# Patient Record
Sex: Male | Born: 1940 | Race: White | Hispanic: No | Marital: Married | Smoking: Former smoker
Health system: Southern US, Community
[De-identification: ages and names within clinical notes are randomized; demographics above are authoritative.]

## PROBLEM LIST (undated history)

## (undated) DIAGNOSIS — I739 Peripheral vascular disease, unspecified: Secondary | ICD-10-CM

## (undated) DIAGNOSIS — I472 Ventricular tachycardia, unspecified: Secondary | ICD-10-CM

## (undated) DIAGNOSIS — Z8673 Personal history of transient ischemic attack (TIA), and cerebral infarction without residual deficits: Secondary | ICD-10-CM

## (undated) DIAGNOSIS — I2510 Atherosclerotic heart disease of native coronary artery without angina pectoris: Secondary | ICD-10-CM

## (undated) DIAGNOSIS — I255 Ischemic cardiomyopathy: Secondary | ICD-10-CM

## (undated) DIAGNOSIS — N183 Chronic kidney disease, stage 3 unspecified: Secondary | ICD-10-CM

## (undated) DIAGNOSIS — I10 Essential (primary) hypertension: Secondary | ICD-10-CM

## (undated) DIAGNOSIS — Z950 Presence of cardiac pacemaker: Secondary | ICD-10-CM

## (undated) DIAGNOSIS — I509 Heart failure, unspecified: Secondary | ICD-10-CM

## (undated) DIAGNOSIS — I219 Acute myocardial infarction, unspecified: Secondary | ICD-10-CM

## (undated) DIAGNOSIS — E079 Disorder of thyroid, unspecified: Secondary | ICD-10-CM

## (undated) DIAGNOSIS — E785 Hyperlipidemia, unspecified: Secondary | ICD-10-CM

## (undated) DIAGNOSIS — I4891 Unspecified atrial fibrillation: Secondary | ICD-10-CM

## (undated) DIAGNOSIS — F419 Anxiety disorder, unspecified: Secondary | ICD-10-CM

## (undated) DIAGNOSIS — Z720 Tobacco use: Secondary | ICD-10-CM

## (undated) HISTORY — DX: Heart failure, unspecified: I50.9

## (undated) HISTORY — DX: Disorder of thyroid, unspecified: E07.9

## (undated) HISTORY — DX: Ventricular tachycardia: I47.2

## (undated) HISTORY — PX: FEMORAL BYPASS: SHX50

## (undated) HISTORY — DX: Chronic kidney disease, stage 3 (moderate): N18.3

## (undated) HISTORY — DX: Presence of cardiac pacemaker: Z95.0

## (undated) HISTORY — DX: Personal history of transient ischemic attack (TIA), and cerebral infarction without residual deficits: Z86.73

## (undated) HISTORY — DX: Hyperlipidemia, unspecified: E78.5

## (undated) HISTORY — DX: Acute myocardial infarction, unspecified: I21.9

## (undated) HISTORY — DX: Chronic kidney disease, stage 3 unspecified: N18.30

## (undated) HISTORY — PX: CHOLECYSTECTOMY: SHX55

## (undated) HISTORY — DX: Ischemic cardiomyopathy: I25.5

## (undated) HISTORY — DX: Unspecified atrial fibrillation: I48.91

## (undated) HISTORY — DX: Atherosclerotic heart disease of native coronary artery without angina pectoris: I25.10

## (undated) HISTORY — DX: Tobacco use: Z72.0

## (undated) HISTORY — DX: Anxiety disorder, unspecified: F41.9

## (undated) HISTORY — DX: Essential (primary) hypertension: I10

## (undated) HISTORY — DX: Peripheral vascular disease, unspecified: I73.9

## (undated) HISTORY — DX: Ventricular tachycardia, unspecified: I47.20

---

## 1999-12-11 DIAGNOSIS — I219 Acute myocardial infarction, unspecified: Secondary | ICD-10-CM

## 1999-12-11 HISTORY — PX: CARDIAC CATHETERIZATION: SHX172

## 1999-12-11 HISTORY — DX: Acute myocardial infarction, unspecified: I21.9

## 1999-12-11 HISTORY — PX: CORONARY ARTERY BYPASS GRAFT: SHX141

## 1999-12-15 ENCOUNTER — Encounter: Payer: Self-pay | Admitting: Emergency Medicine

## 1999-12-15 ENCOUNTER — Inpatient Hospital Stay (HOSPITAL_COMMUNITY): Admission: EM | Admit: 1999-12-15 | Discharge: 2000-01-18 | Payer: Self-pay | Admitting: Emergency Medicine

## 1999-12-16 ENCOUNTER — Encounter: Payer: Self-pay | Admitting: Cardiology

## 1999-12-17 ENCOUNTER — Encounter: Payer: Self-pay | Admitting: Cardiology

## 1999-12-21 ENCOUNTER — Encounter: Payer: Self-pay | Admitting: Thoracic Surgery (Cardiothoracic Vascular Surgery)

## 1999-12-22 ENCOUNTER — Encounter: Payer: Self-pay | Admitting: Thoracic Surgery (Cardiothoracic Vascular Surgery)

## 1999-12-23 ENCOUNTER — Encounter: Payer: Self-pay | Admitting: Thoracic Surgery (Cardiothoracic Vascular Surgery)

## 1999-12-24 ENCOUNTER — Encounter: Payer: Self-pay | Admitting: Cardiothoracic Surgery

## 1999-12-25 ENCOUNTER — Encounter: Payer: Self-pay | Admitting: Thoracic Surgery (Cardiothoracic Vascular Surgery)

## 1999-12-26 ENCOUNTER — Encounter: Payer: Self-pay | Admitting: Thoracic Surgery (Cardiothoracic Vascular Surgery)

## 1999-12-27 ENCOUNTER — Encounter: Payer: Self-pay | Admitting: Thoracic Surgery (Cardiothoracic Vascular Surgery)

## 1999-12-28 ENCOUNTER — Encounter: Payer: Self-pay | Admitting: Thoracic Surgery (Cardiothoracic Vascular Surgery)

## 1999-12-29 ENCOUNTER — Encounter: Payer: Self-pay | Admitting: Thoracic Surgery (Cardiothoracic Vascular Surgery)

## 1999-12-29 ENCOUNTER — Encounter: Payer: Self-pay | Admitting: Surgery

## 1999-12-30 ENCOUNTER — Encounter: Payer: Self-pay | Admitting: Surgery

## 1999-12-31 ENCOUNTER — Encounter: Payer: Self-pay | Admitting: Thoracic Surgery (Cardiothoracic Vascular Surgery)

## 2000-01-01 ENCOUNTER — Encounter: Payer: Self-pay | Admitting: Thoracic Surgery (Cardiothoracic Vascular Surgery)

## 2000-01-02 ENCOUNTER — Encounter: Payer: Self-pay | Admitting: Thoracic Surgery (Cardiothoracic Vascular Surgery)

## 2000-01-03 ENCOUNTER — Encounter: Payer: Self-pay | Admitting: Cardiothoracic Surgery

## 2000-01-04 ENCOUNTER — Encounter: Payer: Self-pay | Admitting: Thoracic Surgery (Cardiothoracic Vascular Surgery)

## 2000-01-05 ENCOUNTER — Encounter: Payer: Self-pay | Admitting: Thoracic Surgery (Cardiothoracic Vascular Surgery)

## 2000-01-06 ENCOUNTER — Encounter: Payer: Self-pay | Admitting: Thoracic Surgery (Cardiothoracic Vascular Surgery)

## 2000-01-07 ENCOUNTER — Encounter: Payer: Self-pay | Admitting: *Deleted

## 2000-01-07 ENCOUNTER — Encounter: Payer: Self-pay | Admitting: Cardiothoracic Surgery

## 2000-01-08 ENCOUNTER — Encounter: Payer: Self-pay | Admitting: Thoracic Surgery (Cardiothoracic Vascular Surgery)

## 2000-01-09 ENCOUNTER — Encounter: Payer: Self-pay | Admitting: Thoracic Surgery (Cardiothoracic Vascular Surgery)

## 2000-01-10 ENCOUNTER — Encounter: Payer: Self-pay | Admitting: Thoracic Surgery (Cardiothoracic Vascular Surgery)

## 2000-01-11 ENCOUNTER — Encounter: Payer: Self-pay | Admitting: Thoracic Surgery (Cardiothoracic Vascular Surgery)

## 2000-01-12 ENCOUNTER — Encounter: Payer: Self-pay | Admitting: Thoracic Surgery (Cardiothoracic Vascular Surgery)

## 2000-01-13 ENCOUNTER — Encounter: Payer: Self-pay | Admitting: Thoracic Surgery (Cardiothoracic Vascular Surgery)

## 2000-01-15 ENCOUNTER — Encounter: Payer: Self-pay | Admitting: Thoracic Surgery (Cardiothoracic Vascular Surgery)

## 2000-02-06 ENCOUNTER — Encounter (HOSPITAL_COMMUNITY): Admission: RE | Admit: 2000-02-06 | Discharge: 2000-05-06 | Payer: Self-pay | Admitting: Cardiology

## 2000-10-03 ENCOUNTER — Encounter: Payer: Self-pay | Admitting: Emergency Medicine

## 2000-10-03 ENCOUNTER — Emergency Department (HOSPITAL_COMMUNITY): Admission: EM | Admit: 2000-10-03 | Discharge: 2000-10-03 | Payer: Self-pay | Admitting: Emergency Medicine

## 2000-10-21 ENCOUNTER — Emergency Department (HOSPITAL_COMMUNITY): Admission: EM | Admit: 2000-10-21 | Discharge: 2000-10-21 | Payer: Self-pay | Admitting: Emergency Medicine

## 2000-10-28 ENCOUNTER — Encounter: Payer: Self-pay | Admitting: Family Medicine

## 2000-10-28 ENCOUNTER — Encounter: Admission: RE | Admit: 2000-10-28 | Discharge: 2000-10-28 | Payer: Self-pay | Admitting: Family Medicine

## 2000-11-12 ENCOUNTER — Encounter: Payer: Self-pay | Admitting: General Surgery

## 2000-11-15 ENCOUNTER — Observation Stay (HOSPITAL_COMMUNITY): Admission: RE | Admit: 2000-11-15 | Discharge: 2000-11-16 | Payer: Self-pay | Admitting: General Surgery

## 2000-11-15 ENCOUNTER — Encounter (INDEPENDENT_AMBULATORY_CARE_PROVIDER_SITE_OTHER): Payer: Self-pay | Admitting: Specialist

## 2000-12-12 ENCOUNTER — Encounter: Payer: Self-pay | Admitting: Emergency Medicine

## 2000-12-12 ENCOUNTER — Inpatient Hospital Stay (HOSPITAL_COMMUNITY): Admission: EM | Admit: 2000-12-12 | Discharge: 2000-12-13 | Payer: Self-pay | Admitting: Emergency Medicine

## 2000-12-13 ENCOUNTER — Encounter: Payer: Self-pay | Admitting: Cardiology

## 2001-06-16 ENCOUNTER — Encounter: Admission: RE | Admit: 2001-06-16 | Discharge: 2001-06-16 | Payer: Self-pay | Admitting: Family Medicine

## 2001-06-16 ENCOUNTER — Encounter: Payer: Self-pay | Admitting: Family Medicine

## 2001-07-23 ENCOUNTER — Encounter: Payer: Self-pay | Admitting: General Surgery

## 2001-07-25 ENCOUNTER — Ambulatory Visit (HOSPITAL_COMMUNITY): Admission: RE | Admit: 2001-07-25 | Discharge: 2001-07-25 | Payer: Self-pay | Admitting: General Surgery

## 2003-08-12 ENCOUNTER — Encounter: Payer: Self-pay | Admitting: Internal Medicine

## 2003-08-12 ENCOUNTER — Ambulatory Visit (HOSPITAL_COMMUNITY): Admission: RE | Admit: 2003-08-12 | Discharge: 2003-08-12 | Payer: Self-pay | Admitting: Internal Medicine

## 2003-12-11 HISTORY — PX: CARDIAC CATHETERIZATION: SHX172

## 2004-10-07 ENCOUNTER — Inpatient Hospital Stay (HOSPITAL_COMMUNITY): Admission: EM | Admit: 2004-10-07 | Discharge: 2004-10-25 | Payer: Self-pay | Admitting: Emergency Medicine

## 2004-10-07 ENCOUNTER — Ambulatory Visit: Payer: Self-pay | Admitting: Infectious Diseases

## 2004-10-07 ENCOUNTER — Ambulatory Visit: Payer: Self-pay | Admitting: Internal Medicine

## 2004-10-09 ENCOUNTER — Encounter: Payer: Self-pay | Admitting: Cardiology

## 2004-10-24 ENCOUNTER — Ambulatory Visit: Payer: Self-pay | Admitting: Infectious Diseases

## 2004-12-10 HISTORY — PX: OTHER SURGICAL HISTORY: SHX169

## 2005-01-01 ENCOUNTER — Ambulatory Visit: Payer: Self-pay

## 2005-01-03 ENCOUNTER — Encounter (HOSPITAL_COMMUNITY): Admission: RE | Admit: 2005-01-03 | Discharge: 2005-04-03 | Payer: Self-pay | Admitting: Cardiology

## 2005-03-12 ENCOUNTER — Emergency Department (HOSPITAL_COMMUNITY): Admission: EM | Admit: 2005-03-12 | Discharge: 2005-03-12 | Payer: Self-pay | Admitting: Emergency Medicine

## 2006-04-18 ENCOUNTER — Emergency Department (HOSPITAL_COMMUNITY): Admission: EM | Admit: 2006-04-18 | Discharge: 2006-04-19 | Payer: Self-pay | Admitting: Emergency Medicine

## 2009-05-25 ENCOUNTER — Encounter: Payer: Self-pay | Admitting: Internal Medicine

## 2009-06-29 DIAGNOSIS — Z95810 Presence of automatic (implantable) cardiac defibrillator: Secondary | ICD-10-CM | POA: Insufficient documentation

## 2009-06-29 DIAGNOSIS — N19 Unspecified kidney failure: Secondary | ICD-10-CM | POA: Insufficient documentation

## 2009-06-29 DIAGNOSIS — I219 Acute myocardial infarction, unspecified: Secondary | ICD-10-CM | POA: Insufficient documentation

## 2009-06-29 DIAGNOSIS — I739 Peripheral vascular disease, unspecified: Secondary | ICD-10-CM

## 2009-06-29 DIAGNOSIS — Z951 Presence of aortocoronary bypass graft: Secondary | ICD-10-CM | POA: Insufficient documentation

## 2009-06-29 DIAGNOSIS — I2510 Atherosclerotic heart disease of native coronary artery without angina pectoris: Secondary | ICD-10-CM

## 2009-06-29 DIAGNOSIS — I472 Ventricular tachycardia, unspecified: Secondary | ICD-10-CM | POA: Insufficient documentation

## 2009-06-29 DIAGNOSIS — I509 Heart failure, unspecified: Secondary | ICD-10-CM | POA: Insufficient documentation

## 2009-06-29 DIAGNOSIS — I10 Essential (primary) hypertension: Secondary | ICD-10-CM | POA: Insufficient documentation

## 2009-06-30 ENCOUNTER — Ambulatory Visit: Payer: Self-pay | Admitting: Internal Medicine

## 2009-07-08 ENCOUNTER — Ambulatory Visit: Payer: Self-pay | Admitting: Internal Medicine

## 2009-07-11 LAB — CONVERTED CEMR LAB
BUN: 19 mg/dL (ref 6–23)
Basophils Absolute: 0.1 10*3/uL (ref 0.0–0.1)
Basophils Relative: 1 % (ref 0.0–3.0)
CO2: 29 meq/L (ref 19–32)
Calcium: 9.3 mg/dL (ref 8.4–10.5)
Chloride: 110 meq/L (ref 96–112)
Creatinine, Ser: 1.5 mg/dL (ref 0.4–1.5)
Eosinophils Absolute: 0.1 10*3/uL (ref 0.0–0.7)
Eosinophils Relative: 1.1 % (ref 0.0–5.0)
GFR calc non Af Amer: 49.1 mL/min (ref >60)
Glucose, Bld: 106 mg/dL — ABNORMAL HIGH (ref 70–99)
HCT: 42.8 % (ref 39.0–52.0)
Hemoglobin: 14.6 g/dL (ref 13.0–17.0)
INR: 2.7 — ABNORMAL HIGH (ref 0.8–1.0)
Lymphocytes Relative: 20.9 % (ref 12.0–46.0)
Lymphs Abs: 1.7 10*3/uL (ref 0.7–4.0)
MCHC: 34.1 g/dL (ref 30.0–36.0)
MCV: 93.7 fL (ref 78.0–100.0)
Monocytes Absolute: 0.6 10*3/uL (ref 0.1–1.0)
Monocytes Relative: 8 % (ref 3.0–12.0)
Neutro Abs: 5.4 10*3/uL (ref 1.4–7.7)
Neutrophils Relative %: 69 % (ref 43.0–77.0)
Platelets: 165 10*3/uL (ref 150.0–400.0)
Potassium: 3.7 meq/L (ref 3.5–5.1)
Prothrombin Time: 27.9 s — ABNORMAL HIGH (ref 10.9–13.3)
RBC: 4.57 M/uL (ref 4.22–5.81)
RDW: 15.3 % — ABNORMAL HIGH (ref 11.5–14.6)
Sodium: 144 meq/L (ref 135–145)
WBC: 7.9 10*3/uL (ref 4.5–10.5)
aPTT: 39.4 s — ABNORMAL HIGH (ref 21.7–28.8)

## 2009-07-15 ENCOUNTER — Ambulatory Visit (HOSPITAL_COMMUNITY): Admission: RE | Admit: 2009-07-15 | Discharge: 2009-07-16 | Payer: Self-pay | Admitting: Internal Medicine

## 2009-07-15 ENCOUNTER — Ambulatory Visit: Payer: Self-pay | Admitting: Internal Medicine

## 2009-07-15 HISTORY — PX: OTHER SURGICAL HISTORY: SHX169

## 2009-07-16 ENCOUNTER — Encounter: Payer: Self-pay | Admitting: Internal Medicine

## 2009-07-21 ENCOUNTER — Encounter: Payer: Self-pay | Admitting: Internal Medicine

## 2009-08-01 ENCOUNTER — Encounter: Payer: Self-pay | Admitting: Internal Medicine

## 2009-08-01 ENCOUNTER — Ambulatory Visit: Payer: Self-pay

## 2009-10-10 ENCOUNTER — Encounter: Payer: Self-pay | Admitting: Internal Medicine

## 2009-10-20 ENCOUNTER — Encounter: Payer: Self-pay | Admitting: Internal Medicine

## 2009-12-27 ENCOUNTER — Ambulatory Visit: Payer: Self-pay | Admitting: Internal Medicine

## 2009-12-27 DIAGNOSIS — I5022 Chronic systolic (congestive) heart failure: Secondary | ICD-10-CM

## 2010-04-05 ENCOUNTER — Encounter: Payer: Self-pay | Admitting: Internal Medicine

## 2010-04-19 ENCOUNTER — Ambulatory Visit: Payer: Self-pay | Admitting: Internal Medicine

## 2010-05-01 ENCOUNTER — Encounter (INDEPENDENT_AMBULATORY_CARE_PROVIDER_SITE_OTHER): Payer: Self-pay | Admitting: *Deleted

## 2010-07-21 ENCOUNTER — Encounter (INDEPENDENT_AMBULATORY_CARE_PROVIDER_SITE_OTHER): Payer: Self-pay | Admitting: *Deleted

## 2010-07-28 ENCOUNTER — Ambulatory Visit: Payer: Self-pay | Admitting: Cardiology

## 2010-08-25 ENCOUNTER — Ambulatory Visit: Payer: Self-pay | Admitting: Cardiology

## 2010-09-22 ENCOUNTER — Ambulatory Visit: Payer: Self-pay | Admitting: Cardiology

## 2010-10-24 ENCOUNTER — Ambulatory Visit: Payer: Self-pay | Admitting: Cardiology

## 2010-11-22 ENCOUNTER — Ambulatory Visit: Payer: Self-pay | Admitting: Cardiology

## 2010-12-06 ENCOUNTER — Encounter (INDEPENDENT_AMBULATORY_CARE_PROVIDER_SITE_OTHER): Payer: Self-pay | Admitting: *Deleted

## 2010-12-09 ENCOUNTER — Encounter: Payer: Self-pay | Admitting: Internal Medicine

## 2010-12-13 ENCOUNTER — Ambulatory Visit: Payer: Self-pay | Admitting: Cardiology

## 2010-12-29 ENCOUNTER — Ambulatory Visit: Payer: Self-pay | Admitting: Cardiology

## 2011-01-02 ENCOUNTER — Encounter: Payer: Self-pay | Admitting: Internal Medicine

## 2011-01-02 ENCOUNTER — Ambulatory Visit
Admission: RE | Admit: 2011-01-02 | Discharge: 2011-01-02 | Payer: Self-pay | Source: Home / Self Care | Attending: Internal Medicine | Admitting: Internal Medicine

## 2011-01-09 ENCOUNTER — Ambulatory Visit: Payer: Self-pay | Admitting: Cardiology

## 2011-01-09 NOTE — Letter (Signed)
Summary: Remote Device Check  [employer], Main Office  [address]   [place], [place] [address]   Phone: [phone]  Fax: [phone]     May 01, 2010 MRN: [id number]   KEJUAN BEKKER [address] [place], [place]  [address]   Dear Mr. Equihua,   Your remote transmission was recieved and reviewed by your physician.  All diagnostics were within normal limits for you.  __X___Your next transmission is scheduled for:  07-20-2010.  Please transmit at any time this day.  If you have a wireless device your transmission will be sent automatically.  Sincerely,  Vella Kohler

## 2011-01-09 NOTE — Assessment & Plan Note (Signed)
Summary: pc2/jml   Visit Type:  Follow-up Primary Provider:  [facility]   History of Present Illness: Mr. Heinbaugh returns today for ICD followup.  He reached ERI on his device several months ago and underwent BiV ICD upgrade secondary to worsening CHF symptoms in the setting of pacing induced LBBB.  He feels well for the most part with mostly class 2 CHF symptoms.  He has not had VT.  He is bradycardic and is predominently V Paced ( 97%).  He has not received any ICD shocks.  He denies peripheral edema or c/p. His energy has improved and dyspnea resolved.  Current Medications (verified): 1)  Coreg 25 Mg Tabs (Carvedilol) .... Two Times A Day 2)  Furosemide 20 Mg Tabs (Furosemide) .... 2 Tabs in Am 1 Tab in Pm 3)  Lovastatin 40 Mg Tabs (Lovastatin) .... Daily 4)  Digoxin 0.125 Mg Tabs (Digoxin) .... Take 1 Tablet By Mouth Daily 5)  Warfarin Sodium 1 Mg Tabs (Warfarin Sodium) .... Use As Directed By Anticoagualtion Clinic 6)  Allopurinol 100 Mg Tabs (Allopurinol) .... Daily 7)  Alprazolam 0.5 Mg Tabs (Alprazolam) .... As Needed 8)  Zemplar 1 Mcg Caps (Paricalcitol) .... Daily 9)  Preservision .Marland Kitchen.. 1 Capsule Two Times A Day 10)  Folbee 2.5-25-1 Mg Tabs (Folic Acid-Vit B6-Vit B12) .... Take One Tablet By Mouth Once Daily.  Allergies: 1)  ! Nitroglycerin  Past History:  Past Medical History: Last updated: 06/29/2009 RENAL FAILURE (ICD-586) MYOCARDIAL INFARCTION (ICD-410.90) PVD (ICD-443.9) CHF (ICD-428.0) HYPERTENSION (ICD-401.9) AUTOMATIC IMPLANTABLE CARDIAC DEFIBRILLATOR SITU (ICD-V45.02) VENTRICULAR TACHYCARDIA (ICD-427.1) CORONARY ARTERY BYPASS GRAFT, HX OF (ICD-V45.81) CAD (ICD-414.00)  Past Surgical History: Last updated: 06/29/2009  coronary artery bypass grafting x 4  12/21/99   Viviann Spare C. Dorris Fetch, M.D.  Laparoscopic cholecystectomy.  lateral internal anal sphincterotomy.  Cardiac Catheterization 2001, 2004, 2005  Review of Systems  The patient denies chest  pain, syncope, dyspnea on exertion, and peripheral edema.    Vital Signs:  Patient profile:   70 year old male Height:      66 inches Weight:      169 pounds BMI:     27.38 Pulse rate:   65 / minute BP sitting:   104 / 72  (left arm)  Vitals Entered By: Laurance Flatten CMA (December 27, 2009 3:05 PM)  Physical Exam  General:  Well developed, well nourished, in no acute distress. Head:  normocephalic and atraumatic Eyes:  PERRLA/EOM intact; conjunctiva and lids normal. Neck:  Neck supple, no JVD. No masses, thyromegaly or abnormal cervical nodes. Chest Wall:  well healed ICD incision. Lungs:  Clear bilaterally with no wheezes, rales, or rhonchi.  No increased work of breathing. Heart:  RRR with normal S1 and S2.  PMI is enlarged and laterally displaced.  No murmurs were appreciated. Abdomen:  Bowel sounds positive; abdomen soft and non-tender without masses, organomegaly, or hernias noted. No hepatosplenomegaly. Msk:  Back normal, normal gait. Muscle strength and tone normal. Pulses:  pulses normal in all 4 extremities Extremities:  No clubbing or cyanosis. Neurologic:  Alert and oriented x 3.    ICD Specifications Following MD:  Lewayne Bunting, MD     Referring MD:  [name] ICD Vendor:  Medtronic     ICD Model Number:  [id number]     ICD Serial Number:  [id number] ICD DOI:  07/15/2009     ICD Implanting MD:  Lewayne Bunting, MD  Lead 1:    Location: RA     DOI: 01/05/2000  Model #: [id number]     Serial #: [id number]     Status: active Lead 2:    Location: RV     DOI: 01/04/2009     Model #: [id number]     Serial #: [id number]     Status: active Lead 3:    Location: LV     DOI: 07/15/2009     Model #: [id number]     Serial #: [id number]     Status: active  Indications::  CHF;Atrial arrhythmias   ICD Follow Up Remote Check?  No Battery Voltage:  3.15 V     Charge Time:  9.0 seconds     Underlying rhythm:  A-fib ICD Dependent:  Yes       ICD Device Measurements Atrium:  Amplitude: 1.4 mV, Impedance:  494 ohms,  Right Ventricle:  Amplitude: 16.5 mV, Impedance: 684 ohms, Threshold: 1.0 V at 0.4 msec Left Ventricle:  Impedance: 342 ohms, Threshold: 2.0 V at 0.8 msec Shock Impedance: 45/59 ohms   Episodes MS Episodes:  1     Percent Mode Switch:  100%     Coumadin:  Yes Shock:  0     ATP:  0     Nonsustained:  0     Atrial Pacing:  0.6%     Ventricular Pacing:  97.2%  Brady Parameters Mode DDDR     Lower Rate Limit:  60     Upper Rate Limit 120 PAV 130     Sensed AV Delay:  100  Tachy Zones VF:  188     VT:  150     Next Remote Date:  03/28/2010     Next Cardiology Appt Due:  12/10/2010 Tech Comments:  RV reprogrammed 2.5@0 .4.  A-fib + coumadin.Optivol normal.   [facility] transmissions every 3 months ROV 1 year Dr. Ladona Ridgel. Altha Harm, LPN  December 27, 2009 3:15 PM  MD Comments:  Agree with above.  Impression & Recommendations:  Problem # 1:  AUTOMATIC IMPLANTABLE CARDIAC DEFIBRILLATOR SITU (ICD-V45.02) The patients BiV ICD is working normally.  Will continue current treatment.  Problem # 2:  VENTRICULAR TACHYCARDIA (ICD-427.1) His VT has been quiet.  He will continue on his current meds. His updated medication list for this problem includes:    Coreg 25 Mg Tabs (Carvedilol) .Marland Kitchen..Marland Kitchen Two times a day    Warfarin Sodium 1 Mg Tabs (Warfarin sodium) ..... Use as directed by anticoagualtion clinic  Problem # 3:  CAD (ICD-414.00) He currently denies anginal symptoms.  Continue meds as below. His updated medication list for this problem includes:    Coreg 25 Mg Tabs (Carvedilol) .Marland Kitchen..Marland Kitchen Two times a day    Warfarin Sodium 1 Mg Tabs (Warfarin sodium) ..... Use as directed by anticoagualtion clinic  Problem # 4:  CHRONIC SYSTOLIC HEART FAILURE (ICD-428.22) He appears to be class 2.  He denies worsening dyspnea.  No other symptoms.  A low sodium diet is recommended. His updated medication list for this problem includes:    Coreg 25 Mg Tabs (Carvedilol) .Marland Kitchen..Marland Kitchen Two times a day    Furosemide 20  Mg Tabs (Furosemide) .Marland Kitchen... 2 tabs in am 1 tab in pm    Digoxin 0.125 Mg Tabs (Digoxin) .Marland Kitchen... Take 1 tablet by mouth daily    Warfarin Sodium 1 Mg Tabs (Warfarin sodium) ..... Use as directed by anticoagualtion clinic  Patient Instructions: 1)  Your physician recommends that you schedule a follow-up appointment in: 12 months with Dr Ladona Ridgel

## 2011-01-09 NOTE — Cardiovascular Report (Signed)
Summary: Office Visit Remote   Office Visit Remote   Imported By: Roderic Ovens 05/03/2010 16:50:19  _____________________________________________________________________  External Attachment:    Type:   Image     Comment:   External Document

## 2011-01-09 NOTE — Letter (Signed)
Summary: Device-Delinquent Phone Journalist, newspaper, Main Office  [address]   [place], [place] [address]   Phone: [phone]  Fax: [phone]     April 05, 2010 MRN: [id number]   [name] [name] [address] [place], [place]  [address]   Dear Mr. Pipe,  According to our records, you were scheduled for a device phone transmission on 03-28-2010.     We did not receive any results from this check.  If you transmitted on your scheduled day, please call us to help troubleshoot your system.  If you forgot to send your transmission, please send one upon receipt of this letter.  Thank you,   Architectural technologist [facility]

## 2011-01-09 NOTE — Consult Note (Signed)
Summary: [facility]  [facility]   Imported By: Marylou Mccoy 12/20/2009 13:39:20  _____________________________________________________________________  External Attachment:    Type:   Image     Comment:   External Document

## 2011-01-09 NOTE — Cardiovascular Report (Signed)
Summary: Office Visit   Office Visit   Imported By: Roderic Ovens 01/02/2010 13:16:18  _____________________________________________________________________  External Attachment:    Type:   Image     Comment:   External Document

## 2011-01-09 NOTE — Letter (Signed)
Summary: Device-Delinquent Phone Journalist, newspaper, Main Office  [address]   [place], [place] [address]   Phone: [phone]  Fax: [phone]     July 21, 2010 MRN: [id number]   [facility] Fuente [address] [place], [place]  [address]   Dear Mr. Enrico,  According to our records, you were scheduled for a device phone transmission on 07/20/2010                              .     We did not receive any results from this check.  If you transmitted on your scheduled day, please call us to help troubleshoot your system.  If you forgot to send your transmission, please send one upon receipt of this letter.  Thank you, Altha Harm, LPN  July 21, 2010 10:59 AM   [facility]

## 2011-01-11 NOTE — Assessment & Plan Note (Signed)
Summary: pc2 medtronic   Visit Type:  Follow-up Primary Provider:  [facility]   History of Present Illness: Mr. Soward returns today for ICD followup.  He is s/p  BiV ICD upgrade secondary to worsening CHF symptoms in the setting of pacing induced LBBB.  He feels well for the most part with mostly class 2 CHF symptoms.  He has not had VT.  He is bradycardic and is predominently V Paced ( 97%).  He has not received any ICD shocks.  He denies peripheral edema or c/p. His energy has improved and dyspnea resolved.  Current Medications (verified): 1)  Coreg 25 Mg Tabs (Carvedilol) .... Two Times A Day 2)  Furosemide 20 Mg Tabs (Furosemide) .... 2 Tabs in Am 1 Tab in Pm 3)  Lovastatin 40 Mg Tabs (Lovastatin) .... Daily 4)  Digoxin 0.125 Mg Tabs (Digoxin) .... Take 1 Tablet By Mouth Daily 5)  Warfarin Sodium 1 Mg Tabs (Warfarin Sodium) .... Use As Directed By Anticoagualtion Clinic 6)  Allopurinol 100 Mg Tabs (Allopurinol) .... Daily 7)  Alprazolam 0.5 Mg Tabs (Alprazolam) .... As Needed 8)  Zemplar 1 Mcg Caps (Paricalcitol) .... Daily 9)  Preservision .Marland Kitchen.. 1 Capsule Two Times A Day 10)  Folbee 2.5-25-1 Mg Tabs (Folic Acid-Vit B6-Vit B12) .... Take One Tablet By Mouth Once Daily.  Allergies: 1)  ! Nitroglycerin  Past History:  Past Medical History: Last updated: 06/29/2009 RENAL FAILURE (ICD-586) MYOCARDIAL INFARCTION (ICD-410.90) PVD (ICD-443.9) CHF (ICD-428.0) HYPERTENSION (ICD-401.9) AUTOMATIC IMPLANTABLE CARDIAC DEFIBRILLATOR SITU (ICD-V45.02) VENTRICULAR TACHYCARDIA (ICD-427.1) CORONARY ARTERY BYPASS GRAFT, HX OF (ICD-V45.81) CAD (ICD-414.00)  Past Surgical History: Last updated: 06/29/2009  coronary artery bypass grafting x 4  12/21/99   Viviann Spare C. Dorris Fetch, M.D.  Laparoscopic cholecystectomy.  lateral internal anal sphincterotomy.  Cardiac Catheterization 2001, 2004, 2005  Review of Systems  The patient denies chest pain, syncope, dyspnea on exertion, and  peripheral edema.    Vital Signs:  Patient profile:   70 year old male Height:      66 inches Weight:      166 pounds BMI:     26.89 Pulse rate:   62 / minute BP sitting:   99 / 66  (left arm)  Vitals Entered By: Laurance Flatten CMA (January 02, 2011 10:41 AM)  Physical Exam  General:  Well developed, well nourished, in no acute distress. Head:  normocephalic and atraumatic Eyes:  PERRLA/EOM intact; conjunctiva and lids normal. Mouth:  Teeth, gums and palate normal. Oral mucosa normal. Neck:  Neck supple, no JVD. No masses, thyromegaly or abnormal cervical nodes. Chest Wall:  well healed ICD incision. Lungs:  Clear bilaterally with no wheezes, rales, or rhonchi.  No increased work of breathing. Heart:  RRR with normal S1 and S2.  PMI is enlarged and laterally displaced.  No murmurs were appreciated. Abdomen:  Bowel sounds positive; abdomen soft and non-tender without masses, organomegaly, or hernias noted. No hepatosplenomegaly. Msk:  Back normal, normal gait. Muscle strength and tone normal. Pulses:  pulses normal in all 4 extremities Extremities:  No clubbing or cyanosis. Neurologic:  Alert and oriented x 3.    ICD Specifications Following MD:  Lewayne Bunting, MD     Referring MD:  [name] ICD Vendor:  Medtronic     ICD Model Number:  [id number]     ICD Serial Number:  [id number] ICD DOI:  07/15/2009     ICD Implanting MD:  Lewayne Bunting, MD  Lead 1:    Location: RA  DOI: 01/05/2000     Model #: [id number]     Serial #: [id number]     Status: active Lead 2:    Location: RV     DOI: 01/04/2009     Model #: [id number]     Serial #: [id number]     Status: active Lead 3:    Location: LV     DOI: 07/15/2009     Model #: [id number]     Serial #: [id number]     Status: active  Indications::  CHF;Atrial arrhythmias   ICD Follow Up Battery Voltage:  3.04 V     Charge Time:  9.7 seconds     Underlying rhythm:  AF ICD Dependent:  Yes       ICD Device Measurements Atrium:  Amplitude: 3.0 mV, Impedance:  437 ohms,  Right Ventricle:  Amplitude: 15.4 mV, Impedance: 684 ohms, Threshold: 1.25 V at 0.40 msec Left Ventricle:  Impedance: 342 ohms, Threshold: 2.00 V at 0.80 msec Shock Impedance: 44/57 ohms   Episodes MS Episodes:  1     Percent Mode Switch:  100%     Coumadin:  Yes Shock:  0     ATP:  0     Nonsustained:  0     Atrial Therapies:  0 Atrial Pacing:  0.6%     Ventricular Pacing:  97.9%  Brady Parameters Mode DDDR     Lower Rate Limit:  60     Upper Rate Limit 120 PAV 130     Sensed AV Delay:  100  Tachy Zones VF:  188     VT:  150     Next Remote Date:  04/05/2011     Next Cardiology Appt Due:  12/11/2011 Tech Comments:  PT IN AF 100% OF TIME. + COUMADIN.  NORMAL DEVICE FUNCTION.  CHANGED LV OUTPUT FROM 3.25 TO 3.00 AND TURNED ON 1:1 SVT.  CHANGED MAX LEAD IMPEDANCES FOR LIA.  DEMONSTRATED TONE FOR PT AND PT AWARE TO CALL OFFICE IF HEARD.  CARELINK 04-05-11 AND ROV IN 12 MTHS W/GT. Vella Kohler  January 02, 2011 10:45 AM MD Comments:  AGree with above.  Impression & Recommendations:  Problem # 1:  CHRONIC SYSTOLIC HEART FAILURE (ICD-428.22) His symptoms have gone from class 3 to class 2. He will continue his curent meds and maintain a low sodium diet. His updated medication list for this problem includes:    Coreg 25 Mg Tabs (Carvedilol) .Marland Kitchen..Marland Kitchen Two times a day    Furosemide 20 Mg Tabs (Furosemide) .Marland Kitchen... 2 tabs in am 1 tab in pm    Digoxin 0.125 Mg Tabs (Digoxin) .Marland Kitchen... Take 1 tablet by mouth daily    Warfarin Sodium 1 Mg Tabs (Warfarin sodium) ..... Use as directed by anticoagualtion clinic  Problem # 2:  AUTOMATIC IMPLANTABLE CARDIAC DEFIBRILLATOR SITU (ICD-V45.02) His device is working normally. Will recheck in several months.  Problem # 3:  VENTRICULAR TACHYCARDIA (ICD-427.1) He has had no ICD shocks or recurrent VT. Will follow. His updated medication list for this problem includes:    Coreg 25 Mg Tabs (Carvedilol) .Marland Kitchen..Marland Kitchen Two times a day    Warfarin Sodium 1 Mg Tabs  (Warfarin sodium) ..... Use as directed by anticoagualtion clinic  Patient Instructions: 1)  Your physician wants you to follow-up in: 12 months with Dr Court Joy will receive a reminder letter in the mail two months in advance. If you don't receive a letter, please call our office to schedule the follow-up  appointment. 2)  Your physician recommends that you continue on your current medications as directed. Please refer to the Current Medication list given to you today.

## 2011-01-11 NOTE — Cardiovascular Report (Signed)
Summary: Certified Letter Signed - Patient (not doing f/u)  Certified Letter Signed - Patient (not doing f/u)   Imported By: Debby Freiberg 12/27/2010 16:54:14  _____________________________________________________________________  External Attachment:    Type:   Image     Comment:   External Document

## 2011-01-11 NOTE — Letter (Signed)
Summary: Device-Delinquent Phone Journalist, newspaper, Main Office  [address]   [place], [place] [address]   Phone: [phone]  Fax: [phone]     December 06, 2010 MRN: [id number]   [facility] Magar [address] [place], [place]  [address]   Dear Mr. Croom,  According to our records, you were scheduled for a device phone transmission on 07-20-2010.     We did not receive any results from this check.  If you transmitted on your scheduled day, please call us to help troubleshoot your system.  If you forgot to send your transmission, please send one upon receipt of this letter.  Thank you,  Vella Kohler  December 06, 2010 11:59 AM   [facility] certified

## 2011-01-17 NOTE — Cardiovascular Report (Signed)
Summary: Office Visit   Office Visit   Imported By: Roderic Ovens 01/10/2011 15:41:41  _____________________________________________________________________  External Attachment:    Type:   Image     Comment:   External Document

## 2011-02-06 ENCOUNTER — Encounter (INDEPENDENT_AMBULATORY_CARE_PROVIDER_SITE_OTHER): Payer: Medicare Other

## 2011-02-06 DIAGNOSIS — I472 Ventricular tachycardia: Secondary | ICD-10-CM

## 2011-02-06 DIAGNOSIS — Z7901 Long term (current) use of anticoagulants: Secondary | ICD-10-CM

## 2011-03-05 ENCOUNTER — Other Ambulatory Visit: Payer: Self-pay | Admitting: *Deleted

## 2011-03-05 DIAGNOSIS — I4891 Unspecified atrial fibrillation: Secondary | ICD-10-CM

## 2011-03-05 DIAGNOSIS — F419 Anxiety disorder, unspecified: Secondary | ICD-10-CM

## 2011-03-05 DIAGNOSIS — E785 Hyperlipidemia, unspecified: Secondary | ICD-10-CM

## 2011-03-05 DIAGNOSIS — I2510 Atherosclerotic heart disease of native coronary artery without angina pectoris: Secondary | ICD-10-CM

## 2011-03-05 MED ORDER — FUROSEMIDE 40 MG PO TABS: ORAL_TABLET | ORAL | Status: DC

## 2011-03-05 MED ORDER — WARFARIN SODIUM 1 MG PO TABS: 1.0000 mg | ORAL_TABLET | ORAL | Status: DC

## 2011-03-05 MED ORDER — ALLOPURINOL 100 MG PO TABS: 100.0000 mg | ORAL_TABLET | Freq: Every day | ORAL | Status: DC

## 2011-03-05 MED ORDER — DIGOXIN 125 MCG PO TABS: 125.0000 ug | ORAL_TABLET | Freq: Every day | ORAL | Status: DC

## 2011-03-05 MED ORDER — ALPRAZOLAM 0.5 MG PO TABS: 0.5000 mg | ORAL_TABLET | Freq: Every evening | ORAL | Status: DC | PRN

## 2011-03-05 MED ORDER — CARVEDILOL 25 MG PO TABS: 25.0000 mg | ORAL_TABLET | Freq: Two times a day (BID) | ORAL | Status: DC

## 2011-03-05 MED ORDER — LOVASTATIN 40 MG PO TABS: 40.0000 mg | ORAL_TABLET | Freq: Every day | ORAL | Status: DC

## 2011-03-05 NOTE — Telephone Encounter (Signed)
Refilled rx per pt request; faxed to [facility]

## 2011-03-06 ENCOUNTER — Ambulatory Visit (INDEPENDENT_AMBULATORY_CARE_PROVIDER_SITE_OTHER): Payer: Medicare Other | Admitting: *Deleted

## 2011-03-06 DIAGNOSIS — I4891 Unspecified atrial fibrillation: Secondary | ICD-10-CM | POA: Insufficient documentation

## 2011-03-06 DIAGNOSIS — Z7901 Long term (current) use of anticoagulants: Secondary | ICD-10-CM

## 2011-03-06 DIAGNOSIS — I472 Ventricular tachycardia: Secondary | ICD-10-CM

## 2011-03-06 DIAGNOSIS — I6350 Cerebral infarction due to unspecified occlusion or stenosis of unspecified cerebral artery: Secondary | ICD-10-CM | POA: Insufficient documentation

## 2011-03-06 LAB — POCT INR: INR: 2.5

## 2011-03-18 LAB — PROTIME-INR
INR: 2.7 — ABNORMAL HIGH (ref 0.00–1.49)
INR: 2.8 — ABNORMAL HIGH (ref 0.00–1.49)
Prothrombin Time: 28.8 seconds — ABNORMAL HIGH (ref 11.6–15.2)
Prothrombin Time: 29.4 seconds — ABNORMAL HIGH (ref 11.6–15.2)

## 2011-03-18 LAB — CBC
HCT: 43 % (ref 39.0–52.0)
Hemoglobin: 14.5 g/dL (ref 13.0–17.0)
MCHC: 33.9 g/dL (ref 30.0–36.0)
MCV: 93.3 fL (ref 78.0–100.0)
Platelets: 159 10*3/uL (ref 150–400)
RBC: 4.6 MIL/uL (ref 4.22–5.81)
RDW: 16.1 % — ABNORMAL HIGH (ref 11.5–15.5)
WBC: 8.4 10*3/uL (ref 4.0–10.5)

## 2011-04-05 ENCOUNTER — Encounter: Payer: Self-pay | Admitting: *Deleted

## 2011-04-08 ENCOUNTER — Encounter: Payer: Self-pay | Admitting: *Deleted

## 2011-04-10 ENCOUNTER — Ambulatory Visit (INDEPENDENT_AMBULATORY_CARE_PROVIDER_SITE_OTHER): Payer: Medicare Other | Admitting: *Deleted

## 2011-04-10 ENCOUNTER — Other Ambulatory Visit: Payer: Self-pay | Admitting: *Deleted

## 2011-04-10 DIAGNOSIS — I6350 Cerebral infarction due to unspecified occlusion or stenosis of unspecified cerebral artery: Secondary | ICD-10-CM

## 2011-04-10 DIAGNOSIS — I4891 Unspecified atrial fibrillation: Secondary | ICD-10-CM

## 2011-04-10 DIAGNOSIS — Z7901 Long term (current) use of anticoagulants: Secondary | ICD-10-CM

## 2011-04-10 MED ORDER — ALPRAZOLAM 0.5 MG PO TABS: 0.5000 mg | ORAL_TABLET | Freq: Three times a day (TID) | ORAL | Status: DC | PRN

## 2011-04-10 NOTE — Telephone Encounter (Signed)
escribe medication per fax request  

## 2011-04-24 NOTE — Op Note (Signed)
NAMEJACARRI, GESNER              ACCOUNT NO.:  [id number]   MEDICAL RECORD NO.:  [id number]          PATIENT TYPE:  INP   LOCATION:  2807                         FACILITY:  [facility]   PHYSICIAN:  Doylene Canning. Ladona Ridgel, MD    DATE OF BIRTH:  13-Jul-1941   DATE OF PROCEDURE:  07/15/2009  DATE OF DISCHARGE:                               OPERATIVE REPORT   PROCEDURE PERFORMED:  Removal of a previously implanted dual-chamber ICD  followed by insertion of a new left ventricular pacing lead utilizing  contrast venography followed by insertion of a new biventricular ICD.   INTRODUCTION:  The patient is a 70 year old male with a history of left  bundle-branch block, severe bradycardia, who has been ventricularly  paced with a defibrillator now for several years.  He has congestive  heart failure and a QRS duration of over 180 milliseconds.  He is now  referred as his defibrillator has reached elective replacement  indication for upgrade to a new biventricular ICD.   PROCEDURE:  After informed consent was obtained, the patient was taken  to the diagnostic EP lab in fasting state.  After usual preparation and  draping, intravenous fentanyl and midazolam was given for sedation.  Intravenous Valium was also given.  Lidocaine 30 mL was infiltrated into  the left infraclavicular region.  A 7-cm incision was carried over this  region.  Electrocautery was utilized to dissect down to the fascial  plane.  The left subclavian vein was then punctured and the guiding  catheter was advanced along with a Glidewire into the right atrium.  Coronary sinus was cannulated with a 6-French hexapolar EP catheter and  the guiding catheter was then advanced into the coronary sinus where  venography was carried out in the coronary sinus.  This demonstrated 2  lateral veins, the largest coursed more posteriorly towards the apex.  The smaller of the two coursed more laterally.  The higher lateral vein  was selected and it was  thought to represent more spread between the RV  pacing lead and the LV lead.  The LV waves measured 12 mV.  The pacing  threshold was variable.  On the more proximal electrode, the pacing  thresholds 2.5 volt at 20 milliseconds and there was no diaphragmatic  stimulation that demonstrated.  With these satisfactory parameters, the  guiding catheter was liberated from the LV lead in the usual manner, and  the lead was secured with the subpectoralis fascia with a figure-of-8  silk suture.  The sewing sleeve was also secured with silk suture.  At  this point, the pocket was irrigated with kanamycin.  Electrocautery was  utilized to enter the ICD pocket.  This was carried out without  difficulty and the old defibrillator was removed.  The new Medtronic  Concerto 2 BiV ICD serial number [id number] was connected to the old  right atrial, old defibrillator, and new LV pacing leads and placed back  in the subcutaneous pocket where the pocket was irrigated with  gentamicin irrigation.  The electrocautery was utilized to assure  hemostasis and the patient was more deeply sedated for  defibrillation  threshold testing.   After the patient was more deeply sedated with fentanyl, Versed, and  valium, VF was induced with T-wave shock and a 15 joules shock was  delivered, which terminated VF restoring sinus rhythm.  AFib did  persist.  At this point, the incision was closed with a 2-0 and 3-0  Vicryl.  Benzoin and Steri-Strips were placed on the skin and the  patient was returned to his room in satisfactory condition.   COMPLICATIONS:  There were no immediate procedure complications.   RESULTS:  This demonstrate successful removal of the previously  implanted dual-chamber ICD, which have reached elective replacement  indication followed by successful insertion of a new LV pacing lead  utilizing coronary sinus venography with connection to a new ICD lead.  To accommodate the additional lead and  generator, the pocket was  revised.      Doylene Canning. Ladona Ridgel, MD  Electronically Signed     GWT/MEDQ  D:  07/15/2009  T:  07/15/2009  Job:  [id number]   cc:   Peter M. [name], M.D.

## 2011-04-24 NOTE — Discharge Summary (Signed)
[name] [name], [name] [name]              ACCOUNT NO.:  [id number]   MEDICAL RECORD NO.:  [id number]          PATIENT TYPE:  INP   LOCATION:  3701                         FACILITY:  [facility]   PHYSICIAN:  Doylene Canning. Ladona Ridgel, MD    DATE OF BIRTH:  1941/08/04   DATE OF ADMISSION:  07/15/2009  DATE OF DISCHARGE:                               DISCHARGE SUMMARY   This patient has an allergy to NITROGLYCERIN.   TIME FOR THIS DICTATION:  Greater than 45 minutes.   FINAL DIAGNOSIS:  Discharging day 1, status post implants of a Medtronic  Concerto II biventricular implantable cardioverter-defibrillator, with a  new cardiac resynchronization lead placed.   SECONDARY DIAGNOSES:  1. History of anteroseptal myocardial infarction in January 2001      presenting with left bundle-branch block.      a.     Concurrent pulmonary edema.      b.     Concurrent cardiogenic shock (intra-aortic balloon pump).  2. Ejection fraction 30-35% at catheterization in January 2001.      a.     Severe two-vessel coronary artery disease in catheterization       laboratory, unable to cross the left anterior descending lesion.      b.     Coronary artery bypass graft on December 21, 1999.      c.     Postoperative ventricular fibrillation requiring direct       current cardioversion.  3. Implantable cardioverter-defibrillator implant, January 2001.      a.     Generator change in 2006.  4. New York Heart Association classroom normal II chronic systolic      congestive heart failure.  5. High-grade heart block secondary to chronic atrial fibrillation.      The patient is on chronic Coumadin as well.  6. Hypertension.  7. Dyslipidemia.  8. Aortoiliac occlusive disease status post femoral to femoral bypass.  9. Infrainguinal arterial occlusive disease status post right femoral      to popliteal bypass in 1999.  10.Anxiety.  11.History of 2 pack per day tobacco habituation.   PROCEDURE:  On July 15, 2009, implant of the  Medtronic Corinth II BiV  ICD with a new cardiac resynchronization lead implanted after a left  venogram.  His device implanted in 2006 was explanted.  Defibrillator  threshold study less than or equal to 15 joules, Dr. Lewayne Bunting.   BRIEF HISTORY:  [name] [name] is a 70 year old man.  He has a history of  ischemic cardiomyopathy, having had an anteroseptal myocardial  infarction in 2001.  It was accompanied by pulmonary edema and  cardiogenic shock.  He required intra-aortic balloon pump.  At  catheterization, ejection fraction 30-35%.  He had a severe proximal LAD  lesion which actually extended somewhat into the left main.  It was a  difficult anatomy, and they were unable to cross the lesion.  He  underwent coronary artery bypass graft surgery on December 21, 1999.  He  had postoperative ventricular fibrillation, requiring multiple  cardioversions despite amiodarone therapy.  He underwent implantation of  an ICD  on that same month.  He has had 1 generator change already in  2006.  The patient has New York Heart Association class II chronic  systolic congestive heart failure.  He is experiencing high-grade heart  block since he has got chronic atrial fibrillation.  His QRS is greater  than 180 msec.   Treatment options have been discussed by Dr. Ladona Ridgel with the patient and  his wife.  He needs a new ICD generator since his current one is at  elective replacement indicator.  He is pacing 90% of the time.  Upgrading to a BiV ICD is indicated.  The patient has pacer-induced left  bundle-branch block.  The patient has never had his defibrillator  discharge.  He has class II congestive heart failure symptoms.  He has  never had syncope or presyncope, not having current chest pain.  This  procedure will be done electively at [facility].   HOSPITAL COURSE:  The patient presents electively on July 15, 2009.  He  underwent left venogram.  He had then implantation of a left  ventricular  lead successfully followed by explantation of his old device with  implant of the Medtronic Concerto II CRT-D device.  He had defibrillator  threshold study of less than or equal to 15 joules.  He has had no  postprocedural complications.  He is alert and oriented x3 at discharge.  His device has been interrogated.  There is no hematoma at the incision  site.  Mobility of the left arm will be discussed with the patient as  well as incision care.   The patient discharges on the following medications.  He has had no  changes since this admission.  They are as follows:  1. New medication of Darvocet-N 100 one to two tabs every 4-6 hours as      needed for pain.  2. Allopurinol 100 mg daily.  3. Coreg 25 mg twice daily.  4. Coumadin 1 mg tablets.  He is to take 1 mg on Sunday, Monday,      Wednesday, and Friday.  He takes 1.5 mg on Tuesday, Thursday,      Saturday.  5. Digoxin os 0.125 mg daily.  6. Foltx 1 tablet daily.  7. Furosemide 400 mg tablets 2 tabs in the morning and 1 tab in the      evening.  8. Lovastatin 40 mg 1 tab daily.  9. Ocuvite 1 tablet daily.  10.Tylenol Extra Strength 500 mg 2 tablets daily as needed.  11.Xanax 0.5 mg twice daily as needed.  12.Zemplar 1 mcg daily.  13.Murine ear drops 2 drops in both ears twice daily as needed.   The patient has followup appointments here at [facility].  He  sees Dr. Ladona Ridgel, Tuesday, November 15, 2009, at 3 o'clock.  He has the  [facility], Thursday, July 28, 2009, at 10:30.   Laboratory studies were taken on July 08, 2009.  White cells 7.9,  hemoglobin 14.6, hematocrit 42.8, platelets of 165, protime 27.9, INR is  2.7.  Sodium 144, potassium 3.7, chloride 110, carbonate 29, glucose  106, BUN is 19, creatinine 1.5.      [name], PA      Doylene Canning. Ladona Ridgel, MD  Electronically Signed    GM/MEDQ  D:  07/15/2009  T:  07/16/2009  Job:  [id number]   cc:   [facility].  Peter M.  [name], M.D.

## 2011-04-27 NOTE — H&P (Signed)
NAMEAMAURI, [name] [name] NO.:  [id number]   MEDICAL RECORD NO.:  [id number]          PATIENT TYPE:  EMS   LOCATION:  MAJO                         FACILITY:  [facility]   PHYSICIAN:  Vesta Mixer, M.D. DATE OF BIRTH:  [birth year]   DATE OF ADMISSION:  10/07/2004  DATE OF DISCHARGE:                                HISTORY & PHYSICAL   [name] [name] is a 70 year old gentleman with a history of coronary artery  disease, congestive heart failure, ventricular tachycardia, and peripheral  vascular disease.  He is admitted with flash pulmonary edema.  The patient  has a long history of cardiac disease.  He had a large anterior wall  myocardial infarction in 2001.  He had a balloon pump placed following the  infarction and had coronary artery bypass grafting a week or so later.  Several weeks following that he  had an AICD placed for ventricular  tachycardia.  He has done fairly well but has been fairly noncompliant with  his diet.  He does not like to go to the doctor, according to the family.  He has had progressive shortness of breath over the past several weeks but  has not called his medical doctor or Dr. [name].  Tonight around 4:30 he had  severe respiratory distress and as brought in by his family.  The patient  was hoping just to take a nitroglycerin and was think that that would  improve.  The patient was seen in the emergency room and was found to have  severe flash pulmonary edema.  He was placed in Bi-PAP and we were called  for further evaluation.  The patient denies having any episodes of chest  pain.  He does have severe shortness of breath, especially with exertion.  He denies any real PND or orthopnea recently.   CURRENT MEDICATIONS:  1.  Amiodarone 100 mg a day.  2.  Ecotrin 325 mg a day.  3.  Furosemide 80 mg a day.  4.  Digoxin 0.125 mg a day.  5.  Altace 5 mg a day.  6.  Lopressor (or perhaps Coreg) 6.25 mg a day.  7.  Alprazolam 0.5 mg as needed.  8.   Zocor 20 mg a day.   ALLERGIES:  None.   PAST MEDICAL HISTORY:  1.  History of coronary artery disease.      1.  He is status post a large anterior wall myocardial infarction in          2002.      2.  He is status post coronary artery bypass grafting in 2001.  2.  History of peripheral vascular disease.      1.  He is status post right fem-pop bypass grafting as well as fem-fem          bypass grafting to the left side.  3.  History of ventricular tachycardia.      1.  Status post AICD placement.  4.  Hypertension.  5.  Congestive heart failure.   SOCIAL HISTORY:  The patient used to smoke two packs a day but quit with  his  myocardial infarction.   FAMILY HISTORY:  His brother had a history of an aneurysm.  His father and  his mother died of a myocardial infarction but at an old age.   REVIEW OF SYSTEMS:  He states he has had a slight cough, productive of  yellow sputum.  He has had progressive shortness of breath for the past  several weeks.  He is still eating lots of salty foods including sausage.   PHYSICAL EXAMINATION:  GENERAL:  He is an elderly gentleman appears to be  somewhat older than 63.  He is currently on Bi-PAP.  VITAL SIGNS:  His blood pressure is 100/60 with a heart rate of 90.  HEENT:  Reveals 2+ carotids.  He was on Bi-PAP, so I could not detect any  bruits.  LUNGS:  Reveal bilateral rales.  HEART:  Regular rate.  S1 S2.  I was not able to hear an S3, gallop.  ABDOMEN:  Reveals nontender.  EXTREMITIES:  He has no clubbing, cyanosis, or edema.  NEUROLOGIC:  Nonfocal.   LABORATORY DATA:  White blood cell count 16.9 with a hemoglobin of 17.3 and  a hematocrit of 51.1.  His BNP is 418.9.  Sodium is 137.  Potassium is 4.4.  Chloride is 111.  His BUN is 41, creatinine is 2.0, glucose is 218.  Arterial blood gas reveals a pH of 7.14 with a pCO2 of 56.   His chest x-ray reveals severe bilateral pulmonary edema.  He has an AICD  placed.   His EKG reveals sinus  tachycardia at 146.  He has atrial sensing with  ventricular pacing.   IMPRESSION AND PLAN:  1.  Severe congestive heart failure.  The patient actually has been      deteriorating for the past several weeks.  I do not think this severe      flash pulmonary edema is due to a rupture of a coronary plaque or a      rupture of a papillary muscle.  I think that this is just progressive      congestive heart failure that has been going on for the past several      weeks.  He still eats a lot of salty foods.  He is currently on Bi-PAP      and has received 160 mg of intravenous Lasix.  He still might not make      much urine.  We will continue with the intravenous Lasix twice a day.      We will start him on a low-dose dopamine drip.  We will continue on Bi-      PAP.  We will check cardiac enzymes.  We will need to get an      echocardiogram on Monday.  We will add a TSH to his current laboratory      data.  We      will direct our other therapy depending on how well he does on the      dopamine drip.  2.  Renal insufficiency, stable.  3.  History of ventricular tachycardia, stable.       PJN/MEDQ  D:  10/07/2004  T:  10/07/2004  Job:  [id number]   cc:   Peter M. [name], M.D.  [address], [address]  [place], [place] [address]  Fax: [phone]   Marjory Lies, M.D.  [address]  [place]  [place] [address]  Fax: [phone]   Doylene Canning. Ladona Ridgel, M.D.

## 2011-04-27 NOTE — Consult Note (Signed)
[name], [name] [name]              ACCOUNT NO.:  [id number]   MEDICAL RECORD NO.:  [id number]          PATIENT TYPE:  INP   LOCATION:  3316                         FACILITY:  [facility]   PHYSICIAN:  Pramod P. Pearlean Brownie, MD    DATE OF BIRTH:  11/30/1941   DATE OF CONSULTATION:  DATE OF DISCHARGE:                                   CONSULTATION   ADDENDUM:  I spent two hours of critical care time at the patient's bedside  monitoring his neurological and __________ status and supervising in his  care.       PPS/MEDQ  D:  10/12/2004  T:  10/13/2004  Job:  [id number]

## 2011-04-27 NOTE — Cardiovascular Report (Signed)
NAME:  [name] [name], [name] [name]                        ACCOUNT NO.:  [id number]   MEDICAL RECORD NO.:  [id number]                   PATIENT TYPE:  OIB   LOCATION:  2855                                 FACILITY:  [facility]   PHYSICIAN:  Doylene Canning. Ladona Ridgel, M.D.               DATE OF BIRTH:  1941/03/14   DATE OF PROCEDURE:  08/12/2003  DATE OF DISCHARGE:                              CARDIAC CATHETERIZATION   PROCEDURE:  Removal of a previously implanted ICD and insertion of a new  dual-chamber ICD.   INDICATION:  History of ischemic cardiomyopathy with VT and IC at ERI.   I. INTRODUCTION:  The patient is a 70 year old man with history of coronary  artery disease status post bypass surgery with ventricular tachycardia.  He  has been on amiodarone and is status post ICD insertion several years ago.  He returns today for ICD generator change.   II. PROCEDURE:  After informed consent was obtained, the patient was taken  to the diagnostic EP lab in the fasting state.  After the usual preparation  and draping, intravenous fentanyl and midazolam was given for sedation.  A  total of 30 ml of lidocaine was infiltrated into the left infraclavicular  region at the old generator scar site.  A 9-cm incision was carried out over  this region and electrocautery utilized to dissect down to the fascial  plane.  Of note, the patient had very dense fibrous adhesions surrounding  his defibrillator as well as his leads.  These were freed up with a  combination of gentle, blunt dissection and electrocautery.  At this point,  the old Medtronic __________ dual-chamber defibrillator was removed and the  Medtronic Maximo DR model 7278 dual-chamber pacemaker, serial number  [id number] was connected to the old defibrillation leads and placed back in  the subcutaneous pocket.  Prior to this, the leads were interrogated with P  waves of 2 mV, R waves of 25 mV and a pace impedance in the atrium and  ventricle 460 and 910 ohms  respectively.  The atrial threshold was 0.7 V at  0.5 msec and the ventricular pressure 0.5 V at 0.5 msec.  At this point, the  patient was more deeply sedated and defibrillation threshold testing carried  out.   After the patient was deeply sedated with intravenous fentanyl and  midazolam, VF was induced with a T-wave shock.  A 15 joule shock terminated  ventricular fibrillation restoring sinus rhythm.  At this point, the pocket  was again irrigated with kanamycin and the incision closed with a layer of 2-  0 Vicryl followed by layer of 3-0 Vicryl, followed by a layer of 4-0 Vicryl.  Benzoin was painted on the skin, Steri-Strips were applied and a pressure  dressing placed and the patient returned to his room in satisfactory  condition.   III. COMPLICATIONS:  There were no immediate procedural complications.    IV. RESULTS:  This demonstrates successful removal of a previously implanted  Medtronic dual-chamber defibrillator and insertion of a new Medtronic Maximo  DR dual-chamber pacemaker with successful defibrillation threshold testing.                                                 Doylene Canning. Ladona Ridgel, M.D.    GWT/MEDQ  D:  08/12/2003  T:  08/12/2003  Job:  [id number]   cc:   Mark S. [name], M.D.  [address] [place]  [place] [address]  Fax: [phone]

## 2011-04-27 NOTE — Consult Note (Signed)
[name], [name]              ACCOUNT NO.:  [id number]   MEDICAL RECORD NO.:  [id number]          PATIENT TYPE:  INP   LOCATION:  2110                         FACILITY:  [facility]   PHYSICIAN:  Pramod P. Pearlean Brownie, MD    DATE OF BIRTH:  23-Mar-1941   DATE OF CONSULTATION:  10/12/2004  DATE OF DISCHARGE:                                   CONSULTATION   REFERRING PHYSICIAN:  Peter M. [name], M.D.   REASON FOR CONSULTATION:  Stroke.   HISTORY OF PRESENT ILLNESS:  [name] [name] is a 70 year old gentleman who had  elective coronary catheterization this afternoon which finished at about 2  p.m.  He was found to have normal neurological status when he was  transferred from the post catheterization unit to 3300 floor.  It was last  found to be normal at 3 p.m. by the nurse.  At 4 p.m. he was found to be  aphasic with dense right hemiplegia.  I was called and immediately code  stroke was initiated.  I examined the patient and found him to be globally  aphasic with dense hemiplegia with right gaze paresis and right homonymous  hemianopsia with NIH stroke scale of 22.  The patient was immediately  transported for CT scan which showed no evidence of left hemispheric  hemorrhage or early signs of infarction.  A remote age small lacunar  infarction was noted in the right subcortical region.  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  We discussed benefits and discussed IV thrombolysis with TPA as  well as interventional management of stroke with mechanical and/or intra-  arterial TPA.  The patient's wife understood the risk and is willing to  proceed with acute interventional treatment for his left middle cerebral  artery  clot.   PAST MEDICAL HISTORY:  1.  Peripheral vascular disease.  2.  Coronary artery disease.  3.  Status post MI and CABG, automatic implantable  cardioverter      defibrillator.  4.  Hypertension.  5.  Congestive heart failure.  6.  Chronic renal failure.   FAMILY HISTORY:  Not significant for anybody with stroke.   SOCIAL HISTORY:  The patient use to smoke but quit following his MI.   HOME MEDICATIONS:  1.  Aspirin.  2.  Digoxin.  3.  Altace.  4.  Zocor.  5.  Protonix.  6.  Cordarone.  7.  Colace.  8.  Lasix.  9.  Coumadin.  10. Mucomyst.  11. Zofran.   REVIEW OF SYMPTOMS:  Significant for shortness of breath, cough and  palpitations.   PHYSICAL EXAMINATION:  GENERAL APPEARANCE:  [name] in no acute  distress.  VITAL SIGNS:  He is afebrile.  Pulse 92 and per minute, irregularly  irregular atrial fibrillation.  Blood pressure is 145/90, respiratory rate  18 per minute.  Distal pulses are well felt.  HEENT:  Head is atraumatic.  ENT unremarkable.  NECK: Supple without bruit.  LUNGS:  Clear  to auscultation.  CARDIOVASCULAR:  Irregular heart sounds.  ABDOMEN:  Soft and nontender.  NEUROLOGIC:  The patient is drowsy but arouses easily.  He is globally  aphasic, does not follow any commands.  He has nonsensical speech.  He has  left gaze preference but he can voluntarily look to the right.  He has dense  right-sided homonomous hemianopsia and does not blink to threat.  There is  mild right lower facial weakness.  His tongue is in the midline.  He has  dense flaccid right hemiplegia and does not move the right side event to  noxious stimuli.  He has good voluntary antigravity strength on the left  side.  Coordination could not be tested.  He does not withdraw to noxious  stimuli as well on the right as compared to the left.  Right plantar is  equivocal, left is downgoing.   LABORATORY DATA:  Noncontrast CT scan of the head done today reveals no left  sided hemorrhage or acute infarction.  Old remote age lacunar infarction is  noted in the right coronary data as well as on the left side also in the   high parietal region.   Stat labs, BMP and coagulation panel are pending.  INR two days ago was 1.2.  The white blood cell count done today was unremarkable.   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   PLAN:  The patient has extremely poor prognosis of survival and without  disability given his large size of stroke.  He may benefit with IV  thrombolysis with TPA with or without mechanical and intra-arterial  thrombolysis.  I have explained the risks and benefits of both to the  patient's wife and she understands and is willing to proceed.  We will begin  standard dose of combined IV and IA TPA protocol with 0.6 mg IV TPA after  the coagulation labs come back as normal.  The patient will be taken to the  radiological catheterization lab and if large vessel clot is found, he will  undergo mechanical and/or intra-arterial TPA as per _________ protocol.  Will consult Dr. Corliss Skains for the same.  The patient will be admitted to  intensive care unit and will have strict control of blood pressure as well  as attention to fluid and volume status and renal status.  I will be happy  to follow the patient in consult.  Kindly call for questions.  He will need  anticoagulation with IV heparin about 24 hours after his stroke depending on  the size of the stroke and of reperfusion hemorrhage.   Thank you for this referral.      PPS/MEDQ  D:  10/12/2004  T:  10/13/2004  Job:  [id number]

## 2011-04-27 NOTE — Op Note (Signed)
[facility]  Patient:    [name] [name], [name] [name]                     MRN: [id number] Proc. Date: 11/15/00 Adm. Date:  [id number] Attending:  Tempie Donning                           Operative Report  NO DICTATION. DD:  11/15/00 TD:  11/15/00 Job: [id number]

## 2011-04-27 NOTE — Consult Note (Signed)
[place]. [facility]  Patient:    [name] [name]                      MRN: [id number] Proc. Date: 12/28/99 Adm. Date:  [id number] Attending:  Charlett Lango CC:         [name] [name], M.D.             [name] [name], M.D.             [name] [name], [facility]             [name] [name], [facility]                          Consultation Report  INDICATION FOR CONSULTATION:  Evaluation of recurrent ventricular arrhythmias.  INTRODUCTION:  The patient is a 70 year old man with a history of tobacco abuse and hypertension as well as peripheral vascular disease, who present to the hospital after complaining of approximately one month of substernal chest pain which suddenly worsened.  He was found to have a left bundle branch block at baseline and because of his symptoms and hemodynamic instability with hypotension, was taken  urgently to the catheterization lab where he was found to have a proximally occluded left anterior descending coronary artery.  Attempts to restore blood flow to this artery were unsuccessful.  The patient had an intra-aortic balloon pump  placed and his condition gradually stabilized.  He subsequently underwent coronary artery bypass grafting by Dr. Viviann Spare C. [name] with a left internal mammary artery grafted to his LAD and sequential saphenous vein grafts grafted to obtuse marginal branches and a saphenous vein graft to his PDA.  The patient had an episode of ventricular fibrillation in the perioperative course which was successfully defibrillated and he was treated with lidocaine and then begun on amiodarone.  He was doing extremely well yesterday and was in preparation for transfer to a step-down bed when he developed recurrent ventricular fibrillation; this occurred at 2300 on December 27, 1999 and was characterized by several PVCs, initiating monomorphic ventricular tachycardia which very quickly  degenerated into ventricular fibrillation.  This occurred seven times and the patient was defibrillated on each occasion.  He was successfully intubated and begun on IV amiodarone.  The patient had an additional episode of ventricular fibrillation t 4 oclock this morning.  Since then, he has remained hemodynamically stable. His cardiac enzymes have demonstrated a CK rise but no significant CK-MB rise.  His  initial potassium during his cardiac arrest was 6.1, but subsequent repeat potassium levels have been within therapeutic limits.  PAST MEDICAL HISTORY:  The patients past medical history is notable for history of peripheral vascular disease, status post fem-fem bypass approximately two years  ago.  He has a history of anxiety disorder, has had a history of back surgery and has a history of hypertension.  ALLERGIES:  He denies any allergies.  CURRENT MEDICATIONS: 1. Intravenous amiodarone. 2. In addition, he is on Lasix once a day, which was recently held. 3. He is on Atrovent inhalers. 4. He is taking digoxin 0.125 mg a day. 5. Aspirin one tablet a day. 6. IV H2 blockers. 7. Potassium supplements. 8. He is also on Altace 2.5 mg a day.  FAMILY HISTORY:  His family history is notable for coronary artery disease.  SOCIAL HISTORY:  The patient smokes two packs of cigarettes per day and is married with two daughters.  REVIEW OF SYSTEMS:  His review of systems is as previously noted and because of his intubation, is unable to be obtained additionally.  PHYSICAL EXAMINATION:  GENERAL:  He is a pleasant middle-aged man who is presently intubated and sedated.  VITAL SIGNS:  His blood pressure is 100/60.  His pulse is 80 and regular.  The respirations are 20.  The temperature is 99.3, rectally.  HEENT:  Normocephalic, atraumatic.  The pupils are equal and round.  The oropharynx is somewhat dried.  NECK:  No clear-cut jugular venous distention.  The carotids were 1+  and symmetric. There is no thyromegaly.  LUNGS:  Rales in the bases bilaterally.  CARDIOVASCULAR:  Somewhat distant with a regular rate and rhythm.  ABDOMEN:  Soft, nontender and nondistended.  EXTREMITIES:  Warm.  There is no peripheral edema.  EKG:  His EKG demonstrates normal sinus rhythm with left bundle branch block and atrial pacing with a first-degree A-V block as well.  IMPRESSION: 1. Monomorphic ventricular tachycardia, degenerating into a ventricular    fibrillation. 2. Ischemic cardiomyopathy, status post coronary artery bypass grafting. 3. History of peripheral vascular disease. 4. History of tobacco abuse. 5. Paroxysmal atrial fibrillation.  DISCUSSION:  At this time, [name] [name] cardiac enzymes do not suggest that he as had a recurrent acute myocardial infarction.  It does not appear that ischemia precipitated this problem.  Review of the rhythm strips demonstrate multiple premature ventricular contractions which initiated a brief run of ventricular tachycardia, which very quickly degenerated into ventricular fibrillation. This, of course, occurred on multiple occasions.  During his cardiac arrest, the potassium level was elevated but subsequent evaluations of his potassium level ere normal.  I suspect that this was not caused by hyperkalemia.  RECOMMENDATION:  Recommendations at this time are as follows: 1. Continue intravenous amiodarone. 2. Add a low-dose beta blocker as his blood pressure will tolerate. 3. Follow with cardiac enzymes and confirm that this has not been due to myocardial    infarction. 4. Continue atrial pacing at a rate of 80. 5. Will plan to proceed with ICD implant early next week. DD:  12/28/99 TD:  12/28/99 Job: [id number]

## 2011-04-27 NOTE — Op Note (Signed)
[place]. [facility]  Patient:    MUNEER LEIDER                      MRN: [id number] Proc. Date: 12/21/99 Adm. Date:  [id number] Attending:  Charlett Lango CC:         Peter M. [name], M.D.             Gloriajean Dell. Andrey Campanile, M.D.                           Operative Report  PREOPERATIVE DIAGNOSIS:  Status post acute myocardial infarction complicated by  pulmonary edema with severe left ventricular dysfunction, three vessel coronary  artery disease.  POSTOPERATIVE DIAGNOSIS:  Status post acute myocardial infarction complicated by pulmonary edema with severe left ventricular dysfunction, three vessel coronary  artery disease.  OPERATION:  Median sternotomy, extracorporeal circulation, coronary artery bypass grafting x 4 (LIMA to LAD, sequential saphenous vein graft to first and second obtuse marginal, saphenous vein graft to posterior descending).  Placement of intra-aortic balloon pump via left femoral artery cut down.  SURGEON:  Salvatore Decent. Dorris Fetch, M.D.  ASSISTANT:  Eugenia Pancoast, P.A.  ANESTHESIA:  General.  FINDINGS:  Acute anterior septal MI with anterior septal akinesis by transesophageal echocardiogram.  Initial cardiac index 1.6.  Initial pulmonary artery pressure 60/40.  Failed to wean from bypass x 2 with dopamine and milrinone drips.  Weaned from bypass easily with intra-aortic balloon pump.  Coronary arteries were good targets.  Conduits good.  INDICATION:  Mr. Choung DD:  12/25/99 TD:  12/25/99 Job: [id number]

## 2011-04-27 NOTE — Op Note (Signed)
[place]. [facility]  Patient:    MALACHY, COLEMAN                     MRN: [id number] Proc. Date: 07/25/01 Adm. Date:  [id number] Disc. Date: [id number] Attending:  Glenna Fellows [place]                           Operative Report  PREOPERATIVE DIAGNOSIS:  Severe anal pain, possible fissure.  POSTOPERATIVE DIAGNOSIS:  Severe anal fissure.  PROCEDURES:  Examination under anesthesia and lateral internal anal sphincterotomy.  SURGEON:  Lorne Skeens. Hoxworth, M.D.  ANESTHESIA:  General.  BRIEF HISTORY:  Mehul Rudin is a 70 year old white male who presents with several weeks of severe anal pain with bowel movements.  On examination in the office, external exam was unremarkable.  He was exquisitely tender and could not be adequately examined.  He was felt to possibly have an anal fissure, and examination under anesthesia and possible sphincterotomy has been recommended and accepted.  The procedure, its indications, the risks of bleeding, infection, nonhealing, and degrees of incontinence were discussed and understood.  Patient now brought to the operating room for this procedure.  DESCRIPTION OF PROCEDURE:  The patient was brought to the operating room, placed in the supine position on the operating table, and general anesthesia was induced.  He was given antibiotics preoperatively.  He was carefully positioned in lithotomy position and the perineum sterilely prepped and draped.  Examination of the external anal canal showed some mild external hemorrhoids.  The anus was gently dilated to two fingers.  The internal anal sphincter was noted to be markedly hypertrophied.  In the posterior midline was a very large, deep fissure, about 1-1.5 cm in length and extending down into the muscle.  No other abnormalities were seen.  I elected to proceed with lateral internal anal sphincterotomy.  The intersphincteric groove in the right lateral position was easily  palpable, and a small stab incision was made at this point.  The tip of a hemostat was inserted into the intersphincteric groove and a very large, hypertrophied internal anal sphincter brought up into the small incision and divided with cautery, with good relaxation of this muscle.  Hemostasis was obtained with pressure.  The perianal soft tissue was infiltrated with Marcaine.  Sponge and needle counts were correct.  A dry dressing was applied, the patient taken to recovery in good condition. DD:  07/25/01 TD:  07/26/01 Job: [id number]

## 2011-04-27 NOTE — Cardiovascular Report (Signed)
[place]. [facility]  Patient:    [name] [name]                      MRN: [id number] Proc. Date: 04/09/00 Adm. Date:  [id number] Attending:  [name], Peter Manning CC:         Gloriajean Dell. Andrey Campanile, M.D.             Salvatore Decent Dorris Fetch, M.D.                        Cardiac Catheterization  INDICATIONS FOR PROCEDURE:  The patient is a 70 year old male with history of tobacco abuse, peripheral vascular disease and hypertension.  He presents with severe chest pain, acute pulmonary edema, and a left bundle branch block.  ACCESS:  We attempted access via the right femoral artery via standard Seldinger technique.  We were able to get brisk arterial blood flow but were unable to pass the wire up the right femoral artery.  We then switched to the left femoral access and were able to access this without difficulty.  All catheter exchanges were performed over a wire.  EQUIPMENT: 1. A 7-French arterial sheath. 2. A 6-French right and left Judkins catheters. 3. A 6-French pigtail catheter. 4. A 7-French left Voda 4 guide. 5. A .014 Cross-It wire. 6. An 8-French sheath. 7. A 7-French balloon-tipped Swan-Ganz catheter. 8. An intra-aortic balloon pump.  MEDICATIONS: 1. Dopamine up to nine drops per hour IV. 2. Nitroglycerin initially begun, but later discontinued due to hypotension. 3. The patient had been given heparin in the emergency room.  Initial ACT 192. 4. The patient was given ReoPro bolus at 21 mg and started on a drip.  However,    ReoPro was subsequently discontinued after unsuccessful angioplasty.  CONTRAST:  Omnipaque 340 cc.  HEMODYNAMIC DATA:  Blood pressure was labile during the procedure.  Initially blood pressure was stable, but dropped into the 70s systolic after initial angiography. With initiation of dopamine and stopping his nitroglycerin, his blood pressure increased back to 100-110 systolic.  ANGIOGRAPHIC DATA: 1. Left Main:  The left  coronary arises and distributes in a dominant fashion. The    left main coronary is calcified without significant obstructive disease. 2. Left Anterior Descending Artery:  Occluded proximally, with a nipple sign. his    is a very proximal lesion, with disease extending to the ostium. 3. Left Circumflex Coronary Artery:  A large, dominant vessel.  There is 30%    narrowing in the proximal vessel.  There is a very large first obtuse marginal    vessel, which has a 90% segmental stenosis.  The second obtuse marginal vessel    is a smaller vessel and has a 90% stenosis as well.  The posterior descending    artery has 30% disease proximally. 4. Right Coronary Artery:  A nondominant vessel.  It is without significant disease    and gives excellent collaterals to the entire LAD.  ANGIOPLASTY SUMMARY:  At this point we attempted angioplasty of the proximal LAD, since this appeared to be the culprit lesion.  There is a lot of difficulty choosing appropriate guide.  A JL4 guide nor did a JL5 guide give adequate support.  A left Voda-4 gave good support.  We then attempted to cross the lesion with a  wire.  Despite multiple attempts we were unable to cross the LAD occlusion with a wire.  Multiple wires we tried, including  a high-torqued floppy, high-torqued extra-support, intermediate wire, and a Cross-It wire.  The Cross-It wire gave he best support, but due to acute angulation of the LAD from the left main, and the hard nature of the lesion we were unable to cross the lesion.  At this point of the procedure the patient was pain-free.  We elected to stop since he had excellent collaterals from the right coronary artery.  We then placed an  intra-aortic balloon pump via the left femoral approach, and also placed a Swan-Ganz catheter.  HEMODYNAMICS: 1. Right Atrial Pressure:  12/8.  Mean 8 mmHg. 2. Right Ventricular Pressure:  52, with EDP 9 mmHg. 3. Pulmonary Artery Pressure:  52/27.   Mean 37 mmHg. 4. Pulmonary Capillary Wedge Pressure:  33/45.  Mean 38 mmHg. 5. Aortic Pressure:  115/79.  Mean 93 mmHg. 6. Left Ventricular Pressure:  121. EDP 42 mmHg. 7. Cardiac Output (thermodilution):  2.9 L/min. 8. Cardiac Output (Fick):  3.5 L/min.  DISPOSITION/PLAN:  With intra-aortic balloon pump he was hemodynamically stable and pain free.  Plans were made to treat him with intensive medical therapy for his  congestive heart failure, stabilize medically and consider open heart surgery early next week.  FINAL INTERPRETATION: 1. Severe three-vessel obstructive atherosclerotic coronary artery disease. 2. Moderate to severe left ventricular dysfunction.  Left ventricular    angiography demostrated anterolateral akinesia, with severe apical hypokinesia    with an ejection fraction of 30-35%. 3. Moderate pulmonary hypertension, with elevated left ventricular filling    pressures. DD:  12/15/99 TD:  12/18/99 Job: [id number]

## 2011-04-27 NOTE — H&P (Signed)
[place]. [facility]  Patient:    [name] [name], [name] [name]                     MRN: [id number] Adm. Date:  [id number] Attending:  Eleanora Neighbor CC:         Peter M. [name], M.D.                         History and Physical  CHIEF COMPLAINT: Mr. Campusano is a 70 year old white male admitted with the onset of congestive heart failure tonight.  HISTORY OF PRESENT ILLNESS: He basically had been feeling well but had a cough and congestion over the last three to four days.  He has been help advise a friend on how to put on new brake shoes on his car and shortly thereafter he became agitated and short of breath, with wheezing, and presented to the emergency room with congestive heart failure.  He responded initially to a diuretic.  He has had no real recent salt indiscretion or dietary indiscretion.  He had had no chest pain but with his previous myocardial events he did not have chest pain syndrome.  He had a history of angina and myocardial infarction with cardiogenic shock on December 15, 1999 and on December 21, 1999 he underwent coronary artery bypass grafting x 4.  On January 05, 2000 he underwent placement of an AICD.  To recamp that primary hospitalization, he presented with a history of peripheral vascular disease, tobacco abuse, hypertension, and a two month history of chest pain that was mid to right sternal and brought on by minimal exertion and relieved with rest, lasting five to ten minutes.  EKG obtained showed a left bundle branch block.  A cardiology appointment was made but he had onset of chest pain and presented with an anterior myocardial infarction.  PAST MEDICAL/SURGICAL HISTORY:  1. Prior to that infarction the patient underwent femorofemoral and right     femoropopliteal bypass graft in 1999 performed by Dr. Hart Rochester.  2. History of anxiety.  3. History of back surgery.  4. History of hypertension.  He was a two pack-a-day smoker at that point  in     time and had a positive family history of heart disease.  5. Hospitalization ran from December 15, 1999 to January 18, 2000.  6. He had catheterization with intra-aortic balloon pump on December 15, 1999.  7. Coronary artery bypass grafting x 4 on December 21, 1999.  8. AICD placement on January 05, 2000.  9. Recurrent episodes of ventricular fibrillation, loaded with amiodarone,     and has done reasonably well.  ALLERGIES: No known drug allergies.  CURRENT MEDICATIONS:  1. Amiodarone 300 mg q.d.  2. Ecotrin 325 mg q.d.  3. Furosemide 80 mg p.o. b.i.d.  4. Pepcid 20 mg b.i.d.  5. Digoxin 0.125 mg p.o. q.d.  6. Altace 5 mg q.d.  7. Metoprolol 25 mg p.o. b.i.d.  8. Xanax 0.25 mg p.o. b.i.d.  9. Zocor 5 mg q.d.  SOCIAL HISTORY: The patient previously smoked two packs of cigarettes a day. He made reeds for mills.  He is married.  He has two daughters.  FAMILY HISTORY: A brother died at the age of 55 with an aneurysm.  His father died at the age of 57 with myocardial infarction.  His mother died at the age of 57 of myocardial infarction.  REVIEW OF SYSTEMS: Otherwise as noted.  He  does have chronic anxiety.  PHYSICAL EXAMINATION:  GENERAL: He is a pleasant 70 year old male, currently in no acute distress.  VITAL SIGNS: Blood pressure 112/61, heart rate 70, respiratory rate 22.  HEENT: Negative.  LUNGS: Currently clear.  HEART: No murmur, no gallop.  ABDOMEN: Soft, nontender.  EXTREMITIES: Without edema.  LABORATORY DATA: EKG shows AV packed rhythm.  Chest x-ray shows cardiac enlargement and congestive heart failure.  OVERALL IMPRESSION:  1. Congestive heart failure.  2. Atherosclerotic cardiovascular disease.  PLAN:  1. Diurese.  2. Will rule out myocardial infarction.  3. Overall the patient appears stable at this point in time. DD:  12/12/00 TD:  12/12/00 Job: [id number]

## 2011-04-27 NOTE — Discharge Summary (Signed)
. [facility]  Patient:    [name] [name], [name] [name]                     MRN: [id number] Adm. Date:  [id number] Disc. Date: 12/13/00 Attending:  Eleanora Neighbor                           Discharge Summary  HISTORY OF PRESENT ILLNESS:  [name] [name] is a 70 year old white male who presented with congestive heart failure.  The patient has a history of congestive heart failure.  He is status post large anterior myocardial infarction in January 2001, associated with cardiogenic shock.  He underwent coronary bypass surgery x 4.  He developed ventricular tachycardia postoperatively and was placed on amiodarone and had an ICD implanted. Subsequent follow-up of his postbypass surgery demonstrated the patient developed congestive heart failure in the spring.  He has been on medical therapy and has done reasonably well since that time.  He has had no recurrence of his ventricular tachycardia.  Approximately 3-1/2 weeks ago, the patient underwent cholecystectomy.  Following this, he developed upper respiratory infection with cough, sinus drainage, and congestion.  On the day of admission, the patient became increasingly short of breath with wheezing and came to the emergency room where a chest x-ray showed pulmonary edema. [place] and admits to a fair amount of indiscretion with sodium in his diet.  For details of his past medical history, social history, family history, and physical examination, please see admission history and physical.  PHYSICAL EXAMINATION:  GENERAL:  The patient is a pleasant white male in no apparent distress.  VITAL SIGNS:  Blood pressure 112/61, pulse 70, respirations 22.  He is afebrile.  HEENT:  Unremarkable.  LUNGS:  Fairly clear after IV Lasix.  CARDIAC:  No murmurs or gallops.  ABDOMEN:  Soft, nontender.  He had no edema.  LABORATORY DATA:  ECG showed AV sequential paced  rhythm.  Chest x-ray showed cardiac enlargement with pulmonary edema.  CK was 55 with negative MB.  Sodium 138, potassium 3.3, chloride 106, CO2 23, glucose 203, BUN 20, creatinine 1.4. Other chemistries were unremarkable.  White count was 14,100, hemoglobin 15.3, hematocrit 45.8, platelet count 349,000, troponin .03.  HOSPITAL COURSE:  The patient was admitted, and he was treated with IV diuresis with excellent response.  His potassium was repleted to 3.6.  Serial cardiac enzymes were negative for myocardial infarction.  An ECG showed persistent AV sequential pacing.  Symptoms of dyspnea resolved.  Follow-up chest x-ray the next morning showed resolution of his pulmonary edema.  It was felt that the congestive heart failure was related to recent stressors including his cholecystectomy and subsequent upper respiratory infection as well as indiscretion in his diet.  The patient was discharged home in stable condition on December 13, 2000, on his prior medications.  DISCHARGE DIAGNOSES: 1. Congestive heart failure. 2. Status post anterior myocardial infarction. 3. History of ventricular tachycardia, status post internal    cardioverter-defibrillator implant. 4. Status post coronary artery bypass graft. 5. History of peripheral vascular disease, status post femoral-femoral and    right femoropopliteal bypass grafting. 6. History of anxiety. 7. History of back surgery. 8. History of hypertension.  DISCHARGE MEDICATIONS: 1. Amiodarone 300 mg per day. 2. Ecotrin 325 mg daily. 3. Furosemide 80 mg twice a day. 4. Pepcid 20 mg b.i.d. 5. Digoxin 0.125  mg daily. 6. Altace 5 mg b.i.d. 7. Metoprolol 25 mg b.i.d. 8. Xanax 0.25 mg b.i.d. 9. Zocor 5 mg per day.  The patient was instructed in a low salt diet.  He will follow up with Dr. [name] in two weeks.  DISCHARGE STATUS:  Improved.DD:  12/13/00 TD:  12/13/00 Job: [id number]

## 2011-04-27 NOTE — Op Note (Signed)
[place]  Patient:    [name] [name]                      MRN: [id number] Proc. Date: 12/21/99 Adm. Date:  [id number] Attending:  Charlett Lango CC:         Peter M. [name], M.D.             Gloriajean Dell. Andrey Campanile, M.D.                           Operative Report  PREOPERATIVE DIAGNOSIS:  Status post acute myocardial infarction complicated by  pulmonary edema with superior left ventricular dysfunction and three-vessel coronary artery disease.  POSTOPERATIVE DIAGNOSIS: Status post acute myocardial infarction complicated by  pulmonary edema with superior left ventricular dysfunction and three-vessel coronary artery disease.  PROCEDURE: Median sternotomy, estracorporeal circulation, coronary artery bypass grafting x 4 (LIMA to LAD, sequential saphenous vein graft to first and second obtuse marginal, saphenous vein graft to posterior descending).   Placement of intra-aortic balloon pump via left femoral artery cut down.  SURGEON: Salvatore Decent. Dorris Fetch, M.D.  ASSISTANT: Eugenia Pancoast, P.A.-C.  FINDINGS:  Anterior septal akinesis by transesophageal echocardiography pre- and postbypass, large anterolateral myocardial infarction, severe left ventricular hypertrophy and dilatation.  Good quality target vessels.  Good quality conduits. Long segment of saphenous vein in the midportion of the left leg unusable, mammary artery good quality conduit.  Failed to wean x 2 from bypass with dopamine and melranone.  Weaned easily from bypass with intra-aortic balloon pump.  INDICATIONS:  [name] [name] is a 70 year old gentleman who presented with an acute myocardial infarction.  He had been having crescendo angina for approximately one week.  At presentation he was in pulmonary edema.  He was taken to the cardiac catheterization laboratory where he was found to have total occlusion of the LAD at the ostium which could not be passed with a wire to  attempt angioplasty.  He had a left dominant circulation with significant disease in the circumflex and two large obtuse marginal branches as well as moderate disease in the posterior descending. He had elevated pulmonary arterial and pulmonary capillary wedge pressure and a  cardiac index of 1.7 liters/sq m.  This ruled in for myocardial infarction with a CK of 3500 and MB of 350.  An intra-aortic balloon pump was placed at the time f catheterization for left ventricular dysfunction and pulmonary edema.  Over the next several days the patient improved.  He was able to be weaned from the balloon pump.  Oxygen levels were weaned.  He was able to be weaned off of dobutamine and remained only on dopamine drip.  After giving the patient a week to have recovery of any stunned myocardium he was advised to undergo coronary artery bypass grafting for preservation of remaining ventricular function as well as survival benefit.  The patient understood the risks, benefits, alternatives and indications for surgery which are outlined in detail in the patients chart.  He also understood  that the degree of ventricular function postoperatively could not be predicted.  The patient agreed to proceed.  DESCRIPTION OF PROCEDURE: [name] [name] was brought to the preoperative holding area on December 21, 1999.  Lines were placed to monitor arterial, central venous and  pulmonary arterial pressure.  Of note, the patient had initial pulmonary arterial pressures of 60/40 and initial cardiac output of 3.5, approximately 1.6 to 1.7  liters per minute per sq meter at the time the [place] catheter was placed.  The patient was taken to the operating room, anesthetized and intubated.  The Foley catheter was placed.  Intravenous antibiotics were administered.  The chest, abdomen and waist were prepped and draped in the usual fashion.  A median sternotomy was performed and the left internal mammary artery  was harvested in standard fashion.  Simultaneously, incision was made in the medial  aspect of the left leg and the greater saphenous vein was harvested.  There was a good segment of saphenous vein.  However, it then bifurcated and there was along segment of unusable vein which eventually required going into the thigh to harvest the remaining saphenous vein to have adequate usable conduit.  What was eventually used was of good quality.  The mammary artery was of good quality and the patient was fully heparinized prior to dividing the distal end of the vessel.  There was good flow to the cut end of the mammary and it was placed in a papaverine soaked sponge and placed into the left pleural space.  At this point during the dissection both the surgeon and the assistant were working on the saphenous vein.  The patient developed ischemia manifested by significant ST elevations, increase in pulmonary arterial pressure and diminished cardiac index. The patient was then placed rapidly onto cardiopulmonary bypass.  The aorta and  right atrial appendage were cannulated.  Cardiopulmonary bypass was instituted nd the patient had resolution of ST changes.  The coronary arteries were inspected and anastomotic sites were chosen.  The conduits were inspected and cut to length.  A foam pad was placed in the pericardium to protect the left phrenic nerve.  A temperature probe was placed n the myocardial septum and an antegrade cardioplegia cannula was placed in the ascending aorta.  A retrograde cardioplegia cannula was placed through a purse-string suture in the right atrium and directed into the coronary sinuses.  The positioning of the coronary sinus catheter was confirmed with palpation of he tip as well as a coronary sinus wedge station with balloon inflation.  The aorta was crossclamped.  The left ventricle was entered through the aortic oot vent.  Cardiac arrest was then achieved  with a combination of cold antegrade cardioplegia and topical iced saline.  After achieving initial cardiac arrest additional cold blood cardioplegia was administered via the retrograde catheter to  ensure adequate septal cooling.  The left ventricle remained well decompressed nd a vent was not necessary.  The following distal anastomoses were performed.  First, a reverse saphenous vein graft was place end-to-side in the posterior descending.  It was the final branch of the left circumflex coronary artery and the left dominant circulation.  The posterior descending was a 1.5 mm good quality vessel.  The anastomosis was performed with a running 7-0 Prolene suture.  Additional cardioplegia was administered down the vein graft.  Next, a reverse saphenous vein graft was placed sequentially to the first and second obtuse marginal branches of the left circumflex coronary arteries but these vessels had 80-90% stenosis at their origins.  These were both 1.5 mm good quality vessels.  The anastomoses were performed with running 7-0 Prolene sutures. Side-to-side anastomosis was performed to OM1 and an end-to-side to OM2.  Both anastomoses were probed proximally and distally with a 1.5 mm probe prior to tying the sutures.  Again, there was good flow through this graft and there was good hemostasis at the anastomoses.  Additional cardioplegia was administered down the two vein grafts and via the retrograde catheter.  An incision was made in the pericardium on the left side.  It was kept well anterior to the phrenic nerve.  The left internal mammary artery was then brought this and the distal end was spatulated.  It was anastomosed end-to-side to the AD which was a 2 mm good quality coronary although there was diffuse disease throughout.  The site of the anastomosis had no atherosclerosis.  The anastomosis was performed with a running 8-0 Prolene suture.  At the completion of the mammary  to LAD anastomosis the bulldog clamp was removed from the mammary artery briefly to check hemostasis.  This was then replaced. Additional cardioplegia was administered.  The two proximal vein graft anastomoses were then performed to 4.0 mm punch aortotomies while under crossclamp.  Both anastomoses were performed with running 6-0 Prolene sutures.  The aortic root was allowed to fill with blood to expel any entrapped air prior to removing the cross clamp.  The bulldog clamp was removed from the mammary artery. Septal rewarming was noted.  The aortic crossclamp was removed.  Total crossclamp time was 66 minutes.  Air was aspirated from the vein grafts.  The bulldog clamps were removed and flow was restored.  No defibrillations were required.  All proximal and distal anastomoses were inspected for hemostasis.  Epicardial pacing wires were placed on the right ventricle and right atrium.  The patient was rewarmed.  An attempt was made to wean the patient from bypass hen the cord temperature reached 37 degrees Celcius.  At the initial attempt the patient was on Dopamine and Melranone.  When the flow was kept to half flow the  patient had pulsatile aortic flow and both ventricles appeared to be functioning well.  Transesophageal echocardiography revealed continued anterior and septal hypokinesis.  The patient was placed back on the full bypass because of subsequent increases n pulmonary artery pressures and decrease in systemic pressures.  The patient was  never completely weaned from bypass.  The heart was rested briefly.  Dopamine and Melranone drips were increased and a second attempt to wean them was performed.  Again, the patient developed good pulsatile blood flow when the flow was cut to  half but attempts to decrease further were unsuccessful and met by rising pulmonary artery pressure and ventricular distention.  The patient was then placed back on bypass and again  rested.  An attempt was made to percutaneously access the left femoral artery.  The patient had a previous fem-fem bypass graft.  This was unsuccessful.  An incision was made in the left  groin through the patients preexisting groin incision.  It was carried down through the scar tissue.  The Gore-Tex fem-fem bypass graft was identified and his dissection down along this plane allowed identification of the common femoral artery.  A horizontal Prolene plegeted mattress suture was then placed through he femoral artery at the site chosen for placement of the balloon pump.  A keeper as placed over the suture and the keeper was brought through a separate stab wound  incision.  Intra-aortic balloon pump was then introduced through a stab wound incision in the femoral artery and advanced into the descending thoracic aorta. No sheath was used because of the patients compromised peripheral vasculature.  The balloon was secured in place.  The keeper was secured.  There was good hemostasis.  The wound was then packed with a saline soaked gauze.  The  patient was then weaned from cardiopulmonary bypass with Dopamine and Melranone drips and the intra-aortic balloon pump at 1:1.  The patient was weaned without  difficulty.  Total bypass time was 180 minutes.  The initial cardiac index was greater than 2 liters per minute and remained so throughout the remainder of the postbypass period.  A test dose of protamine was administered and was well tolerated.  The atrial cannula was removed.  The aortic cannula was removed.  There was good hemostasis at both cannulation sites.  The remainder of the protamine was administered without incident.  The chest was irrigated with one liter of warm normal saline containing 1 gram of Vancomycin.  Hemostasis was achieved.  A left pleural and two mediastinal chest tubes were placed through separate subcostal incisions.  The pericardium as not  reapproximated.  The sternum was closed with stainless steel wires.  The pectoralis fascia was closed with a running #1 Vicryl suture.  The subcutaneous  tissue was closed with a running 2-0 Vicryl suture and the skin was closed with  staples.  The leg incisions were closed in two layers with a running 2-0 Vicryl  subcutaneous suture and skin staples.  All sponge, needle and instrument counts  were correct at the end of the procedure.  The patient was taken from the operating  room to the surgical intensive care unit, intubated and in critical condition. DD:  12/25/99 TD:  12/25/99 Job: [id number]

## 2011-04-27 NOTE — Discharge Summary (Signed)
[name] [name], [name] NO.:  [id number]   MEDICAL RECORD NO.:  [id number]          PATIENT TYPE:  INP   LOCATION:  4712                         FACILITY:  [facility]   PHYSICIAN:  Peter M. [name], M.D.  DATE OF BIRTH:  [birth year]   DATE OF ADMISSION:  10/07/2004  DATE OF DISCHARGE:  10/25/2004                                 DISCHARGE SUMMARY   HISTORY OF PRESENT ILLNESS:  [name] is a 70 year old white male with  known history of coronary artery disease.  He is status post acute anterior  myocardial infarction with subsequent coronary artery bypass surgery in  January 2001.  At that time, he had LIMA graft placed to the LAD, sequential  saphenous vein grafts to the first and second marginal vessels, saphenous  vein graft to the PDA.  He has had chronic left ventricular dysfunction and  history of congestive heart failure.  He is status post ICD implant  following bypass surgery due to development of ventricular tachycardia.  He  has been on chronic Amiodarone since that time and has had no recurrent  ventricular tachycardia.  He also has history of hypertension, chronic renal  insufficiency and peripheral vascular disease.  He is status post right fem-  fem bypass grafting in the past.  The patient presented at this time with  acute dyspnea.  He had progressive dyspnea over the past 2-3 weeks prior to  discharge, on the day of admission became acutely short of breath and  presented in acute respiratory distress with flash pulmonary edema.  For  details of this past medical history, social history and family history,  please see admission History and Physical.   LABORATORY DATA:  On admission, pH 7.21, PCO2 42, PO2 57, bicarbonate 17  (This was on BiPAP at 60%.).  White count was 16,900, hemoglobin 17.3,  hematocrit 51.1, platelets 328,000.  Pro time 14.5 with an INR of 1.2.  PTT  was 34.  Sodium 137, potassium 4.4, chloride 111, BUN 41, creatinine 2.0,  glucose  218, albumin 3.0.  Liver function studies were normal.  BNP was  418.9.  Initial CPK was 571 with 114.9 MB.  Repeat CPK was 62 with 68.2 MB.  Lipid panel showed total cholesterol of 84, triglycerides 122, HDL 38, LDL  22.  Digoxin level 1.0.  Initial ECG showed a rapid tachycardia with a rate  of 140 with a left bundle branch block pattern.  It was unclear whether this  was a sinus tachycardia, but could not rule out flutter.  Subsequent ECG did  show sinus rhythm with a left bundle branch block.  Chest x-ray showed acute  bilateral pulmonary edema.   HOSPITAL COURSE:  The patient was admitted to coronary intensive care unit.  He was treated with BiPAP support and given aggressive IV diuresis with IV  Lasix.  As his blood pressure was marginal, he was also started on IV  dopamine at renal dose.  As noted, his cardiac enzymes were elevated, but he  denied any significant chest pain.  He was also anticoagulated with Lovenox.  Initially, his diuresis  was suboptimal, and Zaroxolyn was added.  With this,  he seemed to diurese well.  His chest x-ray showed improvement in his  pulmonary edema pattern.  His cardiac enzymes were consistent with non-Q-  wave myocardial infarction.  Initially, he was scheduled for cardiac  catheterization, but given the fact that his blood pressure as marginal and  he had significant chronic renal insufficiency, we recommended postponing  his cardiac catheterization until his heart failure had stabilized and we  had done all we could to protect his kidneys.  He was able to be weaned off  of BiPAP therapy.  His Altace was held due to low blood pressure.  On  hospital day #2, he did convert to atrial fibrillation with a controlled  ventricular response.  He had been on chronic low dose, oral Amiodarone.  He  was loaded with IV Amiodarone to try and convert him to sinus rhythm, and he  remained in atrial fibrillation throughout the reign of his hospital stay  with a  controlled ventricular response.  Echocardiogram was obtained which  showed septal dyskinesia and apical akinesia with overall ejection fraction  of 30-35% and mild mitral insufficiency and mild left atrial enlargement.  BNP level declined at 380.1.  His BUN increased to a maximum of 85 and  creatinine of 2.6 with aggressive diuresis.  At this point, we reduced his  Lasix dose and he was weaned off of dopamine.  His activity was increased to  chair.  The patient continued to feel much better and had no shortness of  breath or chest pain.  He still had some episodes of hypotension and  actually received some saline bolus to correct this.  However, his  respiratory status improved significantly.  The patient, with reduction in  his Lasix dose, had improvement in his creatinine back to baseline at 2.0.  His BUN improved as well.  He was gently hydrated over 24 hour period and  given a p.o. Mucomyst in order to be able to proceed with cardiac  catheterization.  At the time of his cardiac catheterization, his BUN had  declined to 52 and creatinine of 1.7.  On October 12, 2004, the patient did  undergo cardiac catheterization.  This demonstrated total occlusion of the  ostial LAD.  The circumflex was a large dominant vessel with occluded first  marginal vessel and a 90% stenosis proximal to the left PDA.  The right  coronary artery was dominant and was normal.  Saphenous vein graft to the  left PDA was patent.  Saphenous vein graft to the first obtuse marginal  vessel was patent, and LIMA graft to the LAD was patent.  We did not perform  left ventricular angiography in order to conserve dye load.  His left  ventricular and diastolic pressure was 14 mmHg.  Based on these findings, it  was felt that we should continue to treat the patient medically, optimizing  his therapy for congestive heart failure and also starting him on Coumadin for his atrial fibrillation.  It is noteworthy that the patient  was on  Lovenox from the time of his admission to the time of his cardiac  catheterization at which time it was held for his catheterization procedure.   Approximately 2 hours after his cardiac catheterization, the patient became  acutely dysarthric, had complete flaccidity of his right side consistent  with acute left hemispheric CVA.  Code stroke was called, and the patient  was seen emergently by Dr. Pearlean Brownie of  Neurology.  He was taken emergently to  the interventional radiology suite.  Dr. Corliss Skains performed cerebral  angiography which demonstrated an occlusion of the left middle cerebral  artery with thrombus.  The patient underwent mechanical clot removal  followed by intraarterial thrombolytic therapy with TPA.  He as intubated  during this procedure.  He was subsequently transferred to intensive care  unit where his management was assisted by critical care medicine.  The  patient within the next 12 hours showed significant improvement in his  neurologic function.  He regained use of his right side and had good muscle  strength.  He remained on the ventilator, but post extubation still had some  mild dysarthria and difficulty swallowing, but was able to speak  intelligently.  The patient had no evidence of intracerebral bleed, and was  subsequently started on IV heparin and converted to oral Coumadin once he  was able to take p.o.  Subsequent follow up CT scan showed findings  consistent with a left MCA stroke.  There was no shift or hemorrhage.   During his stay in the ICU, he was aggressively hydrated to maintain  adequate cerebral perfusion.  His Lasix was held.  He did develop increased  pulmonary edema again, and once again, was able to be diuresed.  He was  weaned off the ventilator, but continued to require oxygen support.  His  breathing did improve.  There was some consideration of possible amiodarone  toxicity as his SED rate was significantly elevated at 103.  However,  his  chest x-ray findings were more consistent with pulmonary edema with rapid  resolution without any diuresis.  For this reason, he was not treated with  all steroids.  His overall pulmonary course was one of continued improvement  during his hospital stay.   The patient was seen by stroke team including occupational and physical  therapy and speech pathology.  Initially, his diet was restricted to  __________ and appropriate aspiration precautions.  This was later  progressed to thin liquids based on swallowing studies and modified barium  swallow.  Because of the concern of possible Amiodarone toxicity, his  Amiodarone was discontinued.  We continued to treat his atrial fibrillation  with rate control load, initially with Lanoxin and later with the addition  of beta blocker.  His heart rate remained under good control throughout the  remainder of his hospital stay.  Initially, during his stay in the ICU, his pro time became supratherapeutic with INR's greater than 6.  His heparin was  discontinued and his Coumadin held until his INR drifted down.  It was  clear, at the time of discharge, he had a very low Coumadin requirement  receiving 1 mg daily.  Surprisingly, the patient's renal function remained  stable throughout this acute period.  His creatinine ranged from low 1.4 to  high of 2.0 and BUN had a low of 30 and high of 48.  Liver function studies  remained normal.  Additional laboratory studies included homosysteine levels  elevated to 15.94.  Hemoglobin A1C was normal at 5.4%.  BNP level increased  to a maximum of 916 but declined by October 20, 2004 to 296.  Serum  cortisol was normal at 19.1.   The patient was able to be transferred to telemetry monitoring.  He was  progressively ambulating with physical and occupational therapy.  His diet  was advanced.  He continued to do very well and was able to be gradually  weaned off  of oxygen support and nebulizer therapy.  Coreg was  added for  optimal rate control, and he seemed to tolerate this well in low doses of  6.25 mg b.i.d.  We continued to hold his ACE inhibitor at the time of  discharge due to relatively low blood pressures.  He was able to be  maintained in dry weight with Furosemide 40 mg daily.  Upon transfer to  telemetry, the patient did develop an area of significant swelling, redness  and warmth in his left flank radiating down to the left gluteal area.  Initially, there was some concern this may have been a retroperitoneal  bleed, but subsequent CBC's were normal.  He did have an elevated white  count.  CT of this area showed extensive soft tissue swelling consistent  with inflammatory process, but no focal abscess and no bleed.  He was noted  on CT to have a 3 cm abdominal aortic aneurysm.  Based on these findings, it  was felt that he had abdominal cellulitis.  He was initially started on  Unasyn, and then later for Infectious Disease, recommendations were to  switch to IV Vancomycin.  The erythema and heat resolved, but he continued  to have significant soft tissue swelling and edema in this left flank  region.  It was ID recommendations that he be continued on a full week of  antibiotics, but to be switched to oral doxycycline on discharge.  The  patient continued to make excellent progress with rehabilitation therapies  and was felt to be stable for discharge on October 25, 2004.   DISCHARGE LABORATORIES:  White count on November 14, was 11.9, hemoglobin  10.8, platelets 420,000.  Sodium 141, potassium 4.6, chloride 109, CO2 23,  BUN 48, creatinine 2.0, glucose 106.  Most recent pro time on October 25, 2004, was 25.5 with INR of 3.3.  The patient was discharged to home with  plans for Home Health follow up with occupational and physical therapy.  We  will also request pro time be checked Friday, November 18.   FOLLOWUP:  The patient will return to follow up with Dr. [name] in one week and  will obtain lab work at that time including follow up SED rate, PT, B-  MET, BNP, CBC.  Dr. Pearlean Brownie requested a follow up in two months.   DISCHARGE DIAGNOSES:  1.  Acute pulmonary edema secondary to acute systolic dysfunction and      dietary indiscretion.  2.  Status post old anterior wall myocardial infarction.  3.  Status post coronary artery bypass graft in 2001 with continued graft      patency.  4.  Atrial fibrillation.  5.  History of ventricular tachycardia.  6.  Acute left MCA CVA, status post mechanical clot lysis and thrombolytic      therapy with remarkable improvement.  7.  Left flank cellulitis.  8.  Chronic renal insufficiency.  9.  Peripheral vascular disease status post right fem-fem bypass grafting.   DISCHARGE MEDICATIONS:  1.  Coreg 6.25 mg b.i.d.  2.  Furosemide 40 mg daily.  3.  Potassium 20 mEq daily.  4.  Zocor 20 mg daily.  5.  Protonix 40 mg daily x2 weeks.  6.  Digitek 0.125 mg daily.  7.  Foltx 1 tablet daily.  8.  Coumadin 1 mg every other day, but will need to follow pro times      closely.  9.  Doxycycline 100 mg b.i.d. for 4 more  days.   DISCHARGE INSTRUCTIONS:  1.  The patient was instructed to stop his Amiodarone, Ecotrin, Altace and      Metoprolol at this time.  2.  He was instructed in low-salt, low-fat diet.  3.  He will follow his weights.  4.  Follow up with Dr. [name] in one week.  5.  Check lab work as noted.   CONDITION ON DISCHARGE:  Improved.       PMJ/MEDQ  D:  10/25/2004  T:  10/25/2004  Job:  [id number]   cc:   Marjory Lies, M.D.  [address]  [place]  [place] [address]  Fax: [phone]   Doylene Canning. Ladona Ridgel, M.D.   Pramod P. Pearlean Brownie, MD  Fax: [phone]

## 2011-04-27 NOTE — Op Note (Signed)
[facility]  Patient:    [name] [name], [name] [name]                     MRN: [id number] Proc. Date: 11/15/00 Adm. Date:  [id number] Attending:  Tempie Donning CC:         Teena Irani. Arlyce Dice, M.D.             Paul [name], M.D., Cardiology             Doylene Canning. Ladona Ridgel, M.D. [facility]                           Operative Report  PROCEDURE:  Laparoscopic cholecystectomy.  SURGEON:  Dr. Maryagnes Amos.  ASSISTANT:  Dr. Earlene Plater.  ANESTHESIA:  General endotracheal.  PREOPERATIVE DIAGNOSES:  A 70 year old male with significant cardiac history. Last year, he had MI followed by CABG and placement of an ICP. He is followed by Dr. [name] and Dr. Ladona Ridgel. A recent severe attache of right upper quadrant abdominal pain showed gallstones. Initial slight elevation of enzymes came back to normal by time of surgery.  FINDINGS:  The gallbladder was thin walled and not acutely inflamed.  DESCRIPTION OF PROCEDURE:  Under satisfactory general endotracheal anesthesia, having had preop IV Ancef, the patients abdomen was prepped and draped in a standard fashion. In the preop area, the [employer] rep deprogrammed his pacemaker to allow for safe surgery. He will come back in the PACU and reprogram it.  The patients abdomen was entered through a transverse incision above the umbilicus with the midline being opened into the peritoneum. This was controlled with a figure-of-eight #0 Vicryl suture and an operating Hussan port inserted and secured. Good CO2 pneumoperitoneum was then established. The camera was placed and under direct vision, two #5 ports placed laterally and a second #10 port medially through Marcaine infiltrated skin incisions. Graspers in the lateral port gave excellent exposure and operating through the medial port, I carefully dissected the gallbladder cystic duct junction. A right angled clamp was used to certainly identify the cystic duct and cystic artery and they were  circumferentially dissected. They were controlled with multiple metal clips and divided between the distal 2. The gallbladder then removed from below upward using the coagulating spatula and hook for hemostasis and dissection. The liver bed was made dry by cautery and then the abdomen lavaged with saline and suctioned dry.  An endocatch bag was placed through the upper port into the abdomen and the gallbladder placed in it and secured.  The camera then moved to the upper port and through the umbilical port a large grasper used to extract the bag with the gallbladder intact and without any spillage.  Then the operative site was again checked, lavaged and suctioned dry. Under direct vision, the trocars were then removed and CO2 released. The midline closed with the previous suture and a second interrupted #0 Vicryl. This incision was then infiltrated with Marcaine and subcu closed with 4-0 Vicryl. Steri-Strips used for all incisions and sterile dressings applied. No complications and the sponge and needle counts correct. DD:  11/15/00 TD:  11/15/00 Job: [id number]

## 2011-04-27 NOTE — H&P (Signed)
[facility]  Patient:    [name] [name]                      MRN: [id number] Adm. Date:  [id number] Attending:  [name], Peter Manning CC:         [name] [name]. [name] [name], M.D.                         History and Physical  CHIEF COMPLAINT:  Chest pain.  HISTORY OF PRESENT ILLNESS:  [name] [name] is a 70 year old white male with known  history of peripheral vascular disease, tobacco abuse, and hypertension.  He describes a two-month history of chest pain in the mid to right precordial area  brought on by minimal exertion and relieved with rest.  Typically, this would last 5 to 10 minutes and resolve on its own.  He saw Dr. Andrey [name] yesterday.  ECG demonstrated a left bundle branch block of unknown chronicity.  His x-ray apparently was clear.  He was started on Toprol XL, and a cardiology appointment was made for Monday.  Today, his chest pain recurred and did not resolve.  He had associated shortness of breath and diaphoresis.  He came to the emergency room, and initially his chest pain improved with nitroglycerin but then returned at a 10 n a scale of 10.  His chest x-ray showed acute pulmonary edema.  After receiving nitroglycerin, he developed hypotension and has continued to have pain on a scale of 5 out of 10.  ECG shows persistent left bundle branch block.  PAST MEDICAL HISTORY:  Significant for previous lower extremity revascularization. Presumably, this was a femoral-femoral bypass graft with possible right femoral-popliteal bypass graft two years ago by Dr. Hart Rochester.  He has a history of anxiety.  He has had previous back surgery and has a history of hypertension.  ALLERGIES:  No known allergies.  MEDICATIONS:  Include clorazepate p.r.n., aspirin daily, Advil p.r.n., Prinivil 5 mg per day.  Toprol 50 mg started yesterday.  SOCIAL HISTORY:  The patient smokes two packs per day.  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  He is married and has two  daughters.  FAMILY HISTORY:  A brother died at age 66 with an aneurysm.  Father died at age 39 with myocardial infarction.  Mother died at age 39 with myocardial infarction.  REVIEW OF SYSTEMS:  Otherwise negative.  PHYSICAL EXAMINATION:  GENERAL:  The patient is a well-developed white male who is diaphoretic in mild  respiratory distress.  VITAL SIGNS:  Blood pressure 150/92, pulse 96 and regular, temperature afebrile, respirations 26.  NECK:  He has positive jugular venous distension, 3 cm at 30 degrees.  He has no bruits.  LUNGS:  Bilateral crackles one-half of the way up bilaterally.  CARDIAC:  Distant heart sounds, positive S3, and no murmur.  ABDOMEN:  Obese, soft, nontender.  There are no masses or hepatosplenomegaly. There are no scars.  EXTREMITIES:  He has bilateral femoral scars.  Pulses are 2+ and symmetric.  He has a scar in the right popliteal area as well.  NEUROLOGIC:  Intact.  LABORATORY DATA:  ECG shows normal sinus rhythm with a left bundle branch block.  Chest x-ray shows cardiomegaly with pulmonary edema.  CBC, coags, CPK-MB, and troponin are all normal.  He has mild elevation of his transaminase.  IMPRESSION: 1. Acute congestive heart failure. 2. Possible acute myocardial infarction versus unstable angina pectoris. 3.  Left bundle branch block. 4. Hypertension. 5. Peripheral vascular disease. 6. Tobacco abuse.  PLAN:  The patient was treated emergently with aspirin, IV diuresis, and heparin. Will resume IV nitroglycerin low dose.  The patient will be taken for emergent cardiac catheterization and possible percutaneous intervention.  If need be, will also place Swan-Ganz catheter for hemodynamic monitoring. DD:  12/15/99 TD:  12/15/99 Job: [id number]

## 2011-04-27 NOTE — Cardiovascular Report (Signed)
[name], [name] [name] NO.:  [id number]   MEDICAL RECORD NO.:  [id number]          PATIENT TYPE:  INP   LOCATION:  2110                         FACILITY:  [facility]   PHYSICIAN:  Peter M. [name], M.D.  DATE OF BIRTH:  04/06/1941   DATE OF PROCEDURE:  10/12/2004  DATE OF DISCHARGE:                              CARDIAC CATHETERIZATION   PROCEDURE:  Left heart catheterization, coronary and saphenous vein graft  angiography x2, LIMA graft angiography.   INDICATION FOR PROCEDURE:  The patient is a 70 year old white male, status  post coronary artery bypass surgery in 1997.  He presented with congestive  heart failure and non-Q wave myocardial infarction.  He has also had new  onset of atrial fibrillation.  The patient has chronic renal insufficiency  and hyperlipidemia.  Access was via the left femoral artery using the  standard Seldinger technique.  We initially attempted to access the right  femoral artery, but his simply led to the fem-fem bypass graft over to the  left side.  We therefore accessed the left side, and had no difficulty with  catheter access.   EQUIPMENT:  A 6-French, 4 cm, right and left Judkins catheter, 6-French  pigtail catheter, 6-French LIMA catheter.   MEDICATIONS:  Versed 2 mg IV.   CONTRAST:  Omnipaque contrast, 100 cc.   HEMODYNAMIC DATA:  Aortic pressure was 113/63 with a mean of 81 mmHg.  Left  ventricular pressure was 113 with EDP of 12 mmHg.   ANGIOGRAPHIC DATA:  1.  The left coronary artery arises and distributes in a dominant fashion.      The left main coronary artery is short without significant disease.  2.  The left anterior descending artery is occluded at the ostium.  3.  The left circumflex coronary artery is a large, dominant vessel.  The      first marginal vessel is occluded proximally.  There is diffuse 30-40%      disease throughout the mid circumflex.  The distal circumflex has a 90%      stenosis prior to the PDA.  4.  The right coronary artery is a nondominant vessel and appears normal.  5.  Saphenous vein graft to the left PDA is widely patent with excellent      distal runoff.  It also fills back to the mid circumflex.  6.  The saphenous vein graft to the first marginal vessel is a small-caliber      graft, but also supplies a fairly small-caliber marginal vessel.  It      appears to be patent throughout.  There is relatively slow-flow through      the graft which was felt to be related to catheter being engaged into      the side wall.  7.  The LIMA graft to the LAD is a large graft and is widely patent      throughout.  This fills the entire LAD and the first diagonal.   FINAL INTERPRETATION:  1.  Severe three-vessel obstructive atherosclerotic coronary artery disease.  2.  Continued patency of all bypass grafts including  LIMA graft to the LAD,      saphenous vein graft to the first      obtuse marginal vessel, and saphenous vein graft to the left PDA.  3.  Normal left ventricular end diastolic pressure.  Left ventricular      angiography was not performed due to concerns over dye load.       PMJ/MEDQ  D:  10/12/2004  T:  10/13/2004  Job:  [id number]   cc:   Gloriajean Dell. Andrey Campanile, M.D.  [address]  [place]  [place] [address]  Fax: [phone]   Doylene Canning. Ladona Ridgel, M.D.

## 2011-04-27 NOTE — Op Note (Signed)
[place]. [facility]  Patient:    JOSEF, TOURIGNY                       MRN: [id number] Proc. Date: 01/05/00 Attending:  Doylene Canning. Ladona Ridgel, M.D. [facility] CC:         Peter M. [name], M.D.             Salvatore Decent Dorris Fetch, M.D.             Kathrine Cords, [facility]             Delsa Grana, [facility]                           Operative Report  PROCEDURE:  Implantation of a dual chamber defibrillator.  INDICATIONS:  Recurrent ventricular tachycardia and ventricular fibrillation.  PATIENT PROFILE:  The patient is a 70 year old man who was recently admitted to the hospital after sustaining a large interior myocardial infarction.  He underwent  urgent catheterization and then coronary artery bypass grafting.  His postoperative course was initially complicated by ventricular fibrillation which requiring defibrillation.  He then went for several days without any additional ventricular arrhythmias but unfortunately developed recurrent sustained ventricular arrhythmias, once again requiring cardioversion.  He was subsequently treated with intravenous amiodarone, lidocaine, and ventricular pacing at increased rates in  order to suppress his PVCs.  He continued to have ventricular arrhythmias despite these measures and is referred for ICD implantation.  Prior to his surgery, he underwent repeat adenosine Cardiolite stress testing which demonstrated a large  anterior, anteroapical, and anteroseptal scar with no significant ischemia otherwise noted.  PROCEDURE:  After informed consent was obtained, the patient was taken to the diagnostic EP lab in a fasting state.  After the usual preparation and draping, the anesthesia service was utilized to maintain general anesthesia.  Following this, a 10-cm incision was carried out over the left deltopectoral region. Electrocautery was utilized to dissect down to the subpectoral fascia.  A subcutaneous  pocket as then fashioned with electrocautery.  Following this, 20 cc of contrast was injected into the left venous system in the left upper extremity demonstrating a patent subclavian system.  The [employer] TM [place] 409811 defibrillation lead was inserted by way of a POA sheath into the right atrium.  In addition, the [employer] model 5076 52-cm atrial pacing lead was inserted through the left subclavian vein.  The defibrillation lead was then maneuvered nto the right ventricular outflow track and brought back down into the right ventricular apical region.  In this location, multiple mapping attempts were carried out.  The measured R waves between the RV apex and the mid portion varied between 5 and 11 mV.  At multiple locations, ventricular pacing was carried out, and the best ventricular pacing threshold that could be found was 1.6 volts at .5 msec.  At this location, the ventricular pacing impedance was 1230 ohms.  The lead was allowed to remain in this position which was approximately halfway out into the RV apex.  Next, the atrial lead was maneuvered into the right atrium, and the measured P waves were between 2 and 3 mV.  The lead was actively fixed into the  right atrium.  Following active fixation, the atrial pacing threshold was 0.9 volts at 0.9 msec, and an atrial pacing threshold was 523 ohms with measured P waves f 2.4 mV.  With  atrial and ventricular leads in satisfactory position, the leads ere secured to the subpectoralis fascia.  This was performed with two figure-of-eight sutures which also helped to maintain hemostasis as the patients venous pressures were quite elevated.  Following this, the atrial and defibrillation lead were connected to the [facility] 3DR model 7275 ICD.  The generator was then placed in the subcutaneous pocket.  Kanamycin irrigation was utilized to irrigate the wound.  Defibrillation threshold  testing was carried out.  Interrogation of the device through the telemetry system demonstrated an R wave of 8 mV and a ventricular pacing threshold of 2 volts at 0.5 msec.  The ventricular lead impedance was 1356 ohms.  The atrial pacing threshold was 1 volt to 0.6 msec, and the atrial amplitude was 2.5 mV.  The atrial pacing impedance was 481 ohms.  The defibrillation threshold testing was then carried out.  Multiple attempts at defibrillation testing were performed with both [facility] current and T wave shocks carried out.  The coupling intervals, in order to induce fibrillation, were 400/350.  The initial shock was a 20 joule shock which restored sinus rhythm. he shocking impedance was 37 ohms.  The second DFT shock was again initiated with T wave shock, and this time, a 12 joule shock was used to restore sinus rhythm. t this point, no additional DFT testing was carried out.  Following this, the wound was irrigated with kanamycin irrigation, and it was subsequently closed with a layer of 2-0 Vicryl followed by a layer of 3-0 Vicryl followed by a layer of 4-0 Vicryl in the subcuticular tissues.  Benzoin was painted over the wound. Steri-Strips were applied, and the patient had a pressure dressing placed and held for approximately 10 minutes.  He was then returned to his room in good condition.  COMPLICATIONS:  There were no immediate procedural complications.  RESULTS:  This demonstrates successful implantation of a [employer] Gem 3DR dual chamber defibrillator in a patient with multiple episodes of polymorphic ventricular tachycardia and ventricular fibrillation as well as ventricular flutter.  There were no immediate procedural complications. DD:  01/05/00 TD:  01/07/00 Job: [id number]

## 2011-04-29 ENCOUNTER — Other Ambulatory Visit: Payer: Self-pay | Admitting: Internal Medicine

## 2011-04-30 ENCOUNTER — Ambulatory Visit (INDEPENDENT_AMBULATORY_CARE_PROVIDER_SITE_OTHER): Payer: Medicare Other | Admitting: *Deleted

## 2011-04-30 DIAGNOSIS — I5022 Chronic systolic (congestive) heart failure: Secondary | ICD-10-CM

## 2011-04-30 DIAGNOSIS — I472 Ventricular tachycardia: Secondary | ICD-10-CM

## 2011-05-09 ENCOUNTER — Ambulatory Visit (INDEPENDENT_AMBULATORY_CARE_PROVIDER_SITE_OTHER): Payer: Medicare Other | Admitting: *Deleted

## 2011-05-09 DIAGNOSIS — Z7901 Long term (current) use of anticoagulants: Secondary | ICD-10-CM

## 2011-05-09 DIAGNOSIS — I4891 Unspecified atrial fibrillation: Secondary | ICD-10-CM

## 2011-05-09 DIAGNOSIS — I6350 Cerebral infarction due to unspecified occlusion or stenosis of unspecified cerebral artery: Secondary | ICD-10-CM

## 2011-06-01 ENCOUNTER — Encounter: Payer: Self-pay | Admitting: *Deleted

## 2011-06-06 ENCOUNTER — Ambulatory Visit (INDEPENDENT_AMBULATORY_CARE_PROVIDER_SITE_OTHER): Payer: Medicare Other | Admitting: *Deleted

## 2011-06-06 DIAGNOSIS — Z7901 Long term (current) use of anticoagulants: Secondary | ICD-10-CM

## 2011-06-06 DIAGNOSIS — I6350 Cerebral infarction due to unspecified occlusion or stenosis of unspecified cerebral artery: Secondary | ICD-10-CM

## 2011-06-06 DIAGNOSIS — I4891 Unspecified atrial fibrillation: Secondary | ICD-10-CM

## 2011-06-06 LAB — POCT INR: INR: 2.6

## 2011-06-14 NOTE — Progress Notes (Signed)
ICD remote with ICM 

## 2011-07-04 ENCOUNTER — Ambulatory Visit (INDEPENDENT_AMBULATORY_CARE_PROVIDER_SITE_OTHER): Payer: Medicare Other | Admitting: *Deleted

## 2011-07-04 DIAGNOSIS — I4891 Unspecified atrial fibrillation: Secondary | ICD-10-CM

## 2011-07-04 DIAGNOSIS — Z7901 Long term (current) use of anticoagulants: Secondary | ICD-10-CM

## 2011-07-04 DIAGNOSIS — I6350 Cerebral infarction due to unspecified occlusion or stenosis of unspecified cerebral artery: Secondary | ICD-10-CM

## 2011-07-04 LAB — POCT INR: INR: 2.3

## 2011-07-18 ENCOUNTER — Other Ambulatory Visit: Payer: Self-pay | Admitting: Cardiology

## 2011-07-18 NOTE — Telephone Encounter (Signed)
escribe medication per fax request  

## 2011-08-02 ENCOUNTER — Encounter: Payer: Medicare Other | Admitting: *Deleted

## 2011-08-06 ENCOUNTER — Ambulatory Visit (INDEPENDENT_AMBULATORY_CARE_PROVIDER_SITE_OTHER): Payer: Medicare Other | Admitting: *Deleted

## 2011-08-06 DIAGNOSIS — Z7901 Long term (current) use of anticoagulants: Secondary | ICD-10-CM

## 2011-08-06 DIAGNOSIS — I6350 Cerebral infarction due to unspecified occlusion or stenosis of unspecified cerebral artery: Secondary | ICD-10-CM

## 2011-08-06 DIAGNOSIS — I4891 Unspecified atrial fibrillation: Secondary | ICD-10-CM

## 2011-08-06 LAB — POCT INR: INR: 2.8

## 2011-08-09 ENCOUNTER — Encounter: Payer: Self-pay | Admitting: *Deleted

## 2011-08-10 ENCOUNTER — Encounter: Payer: Self-pay | Admitting: *Deleted

## 2011-08-10 DIAGNOSIS — Z8673 Personal history of transient ischemic attack (TIA), and cerebral infarction without residual deficits: Secondary | ICD-10-CM | POA: Insufficient documentation

## 2011-08-10 DIAGNOSIS — E785 Hyperlipidemia, unspecified: Secondary | ICD-10-CM | POA: Insufficient documentation

## 2011-08-10 DIAGNOSIS — N289 Disorder of kidney and ureter, unspecified: Secondary | ICD-10-CM | POA: Insufficient documentation

## 2011-08-10 DIAGNOSIS — I472 Ventricular tachycardia, unspecified: Secondary | ICD-10-CM | POA: Insufficient documentation

## 2011-08-10 DIAGNOSIS — I4891 Unspecified atrial fibrillation: Secondary | ICD-10-CM | POA: Insufficient documentation

## 2011-08-10 DIAGNOSIS — I739 Peripheral vascular disease, unspecified: Secondary | ICD-10-CM | POA: Insufficient documentation

## 2011-08-10 DIAGNOSIS — I255 Ischemic cardiomyopathy: Secondary | ICD-10-CM | POA: Insufficient documentation

## 2011-08-21 ENCOUNTER — Encounter: Payer: Self-pay | Admitting: Internal Medicine

## 2011-08-21 ENCOUNTER — Ambulatory Visit (INDEPENDENT_AMBULATORY_CARE_PROVIDER_SITE_OTHER): Payer: Medicare Other | Admitting: *Deleted

## 2011-08-21 ENCOUNTER — Other Ambulatory Visit: Payer: Self-pay | Admitting: Internal Medicine

## 2011-08-21 DIAGNOSIS — I255 Ischemic cardiomyopathy: Secondary | ICD-10-CM

## 2011-08-21 DIAGNOSIS — I2589 Other forms of chronic ischemic heart disease: Secondary | ICD-10-CM

## 2011-08-21 DIAGNOSIS — I4891 Unspecified atrial fibrillation: Secondary | ICD-10-CM

## 2011-08-21 DIAGNOSIS — I5022 Chronic systolic (congestive) heart failure: Secondary | ICD-10-CM

## 2011-08-21 DIAGNOSIS — Z95810 Presence of automatic (implantable) cardiac defibrillator: Secondary | ICD-10-CM

## 2011-08-22 ENCOUNTER — Telehealth: Payer: Self-pay | Admitting: Cardiology

## 2011-08-22 ENCOUNTER — Encounter: Payer: Self-pay | Admitting: Cardiology

## 2011-08-22 LAB — REMOTE ICD DEVICE
AL IMPEDENCE ICD: 494 Ohm
BATTERY VOLTAGE: 2.9651 V
BRDY-0004LV: 120 {beats}/min
LV LEAD IMPEDENCE ICD: 342 Ohm
LV LEAD THRESHOLD: 1.875 V
TOT-0002: 0
TOT-0006: 20100806000000
TZAT-0001ATACH: 2
TZAT-0001SLOWVT: 1
TZAT-0001SLOWVT: 2
TZAT-0002ATACH: NEGATIVE
TZAT-0002ATACH: NEGATIVE
TZAT-0002ATACH: NEGATIVE
TZAT-0005SLOWVT: 88 pct
TZAT-0005SLOWVT: 91 pct
TZAT-0011SLOWVT: 10 ms
TZAT-0011SLOWVT: 10 ms
TZAT-0012ATACH: 150 ms
TZAT-0012FASTVT: 200 ms
TZAT-0018ATACH: NEGATIVE
TZAT-0018ATACH: NEGATIVE
TZAT-0018FASTVT: NEGATIVE
TZAT-0019ATACH: 6 V
TZAT-0019ATACH: 6 V
TZAT-0019ATACH: 6 V
TZAT-0019FASTVT: 8 V
TZAT-0019SLOWVT: 8 V
TZAT-0020ATACH: 1.5 ms
TZAT-0020ATACH: 1.5 ms
TZAT-0020FASTVT: 1.5 ms
TZON-0004VSLOWVT: 20
TZST-0001ATACH: 5
TZST-0001FASTVT: 5
TZST-0001FASTVT: 6
TZST-0001SLOWVT: 3
TZST-0001SLOWVT: 4
TZST-0002ATACH: NEGATIVE
TZST-0002FASTVT: NEGATIVE
TZST-0002FASTVT: NEGATIVE
TZST-0003SLOWVT: 15 J
TZST-0003SLOWVT: 35 J
VENTRICULAR PACING ICD: 82.54 pct

## 2011-08-22 NOTE — Telephone Encounter (Signed)
Pt wife calling to see if pt remote device check was received.

## 2011-08-22 NOTE — Telephone Encounter (Signed)
Wants results of ICD check

## 2011-08-24 ENCOUNTER — Ambulatory Visit: Payer: Medicare Other | Admitting: Nurse Practitioner

## 2011-08-24 ENCOUNTER — Ambulatory Visit: Payer: Medicare Other | Admitting: Cardiology

## 2011-08-27 NOTE — Telephone Encounter (Signed)
Left message with daughter, remote received and results will be mailed after MD reads them.

## 2011-08-27 NOTE — Progress Notes (Signed)
icd remote check  

## 2011-09-03 ENCOUNTER — Ambulatory Visit (INDEPENDENT_AMBULATORY_CARE_PROVIDER_SITE_OTHER): Payer: Medicare Other | Admitting: *Deleted

## 2011-09-03 DIAGNOSIS — Z7901 Long term (current) use of anticoagulants: Secondary | ICD-10-CM

## 2011-09-03 DIAGNOSIS — I4891 Unspecified atrial fibrillation: Secondary | ICD-10-CM

## 2011-09-03 DIAGNOSIS — I6350 Cerebral infarction due to unspecified occlusion or stenosis of unspecified cerebral artery: Secondary | ICD-10-CM

## 2011-09-03 LAB — POCT INR: INR: 2.5

## 2011-09-05 ENCOUNTER — Other Ambulatory Visit: Payer: Self-pay | Admitting: *Deleted

## 2011-09-05 MED ORDER — ALPRAZOLAM 0.5 MG PO TABS: 0.5000 mg | ORAL_TABLET | Freq: Every evening | ORAL | Status: DC | PRN

## 2011-09-05 NOTE — Telephone Encounter (Signed)
Phoned in refill for Alprazolam 0.5 mg per Dr.Jordan

## 2011-09-12 ENCOUNTER — Encounter: Payer: Self-pay | Admitting: *Deleted

## 2011-09-17 ENCOUNTER — Other Ambulatory Visit: Payer: Self-pay | Admitting: Cardiology

## 2011-09-17 DIAGNOSIS — I2510 Atherosclerotic heart disease of native coronary artery without angina pectoris: Secondary | ICD-10-CM

## 2011-09-17 NOTE — Telephone Encounter (Signed)
Pt needs carvedilol called into [facility].

## 2011-09-20 ENCOUNTER — Other Ambulatory Visit: Payer: Self-pay | Admitting: *Deleted

## 2011-09-20 DIAGNOSIS — I2510 Atherosclerotic heart disease of native coronary artery without angina pectoris: Secondary | ICD-10-CM

## 2011-09-20 MED ORDER — CARVEDILOL 25 MG PO TABS: 25.0000 mg | ORAL_TABLET | Freq: Two times a day (BID) | ORAL | Status: DC

## 2011-10-01 ENCOUNTER — Ambulatory Visit (INDEPENDENT_AMBULATORY_CARE_PROVIDER_SITE_OTHER): Payer: Medicare Other | Admitting: *Deleted

## 2011-10-01 ENCOUNTER — Encounter: Payer: Self-pay | Admitting: Cardiology

## 2011-10-01 ENCOUNTER — Ambulatory Visit (INDEPENDENT_AMBULATORY_CARE_PROVIDER_SITE_OTHER): Payer: Medicare Other | Admitting: Cardiology

## 2011-10-01 VITALS — BP 100/60 | HR 74 | Ht 68.0 in | Wt 159.8 lb

## 2011-10-01 DIAGNOSIS — I472 Ventricular tachycardia, unspecified: Secondary | ICD-10-CM

## 2011-10-01 DIAGNOSIS — I509 Heart failure, unspecified: Secondary | ICD-10-CM

## 2011-10-01 DIAGNOSIS — I6350 Cerebral infarction due to unspecified occlusion or stenosis of unspecified cerebral artery: Secondary | ICD-10-CM

## 2011-10-01 DIAGNOSIS — I4891 Unspecified atrial fibrillation: Secondary | ICD-10-CM

## 2011-10-01 DIAGNOSIS — I2510 Atherosclerotic heart disease of native coronary artery without angina pectoris: Secondary | ICD-10-CM

## 2011-10-01 DIAGNOSIS — Z7901 Long term (current) use of anticoagulants: Secondary | ICD-10-CM

## 2011-10-01 LAB — POCT INR: INR: 2.5

## 2011-10-01 NOTE — Assessment & Plan Note (Signed)
His rate is well controlled and he is therapeutic on his anticoagulation.

## 2011-10-01 NOTE — Assessment & Plan Note (Signed)
He has chronic systolic dysfunction. He is well compensated on exam today. We will continue with Lanoxin, carvedilol, and furosemide. He is not a good candidate for ACE inhibitors or ARB is because of his renal dysfunction.

## 2011-10-01 NOTE — Progress Notes (Signed)
[name] [name] Date of Birth: 1941-03-09 Medical Record #[id number]  History of Present Illness: [name] [name]  is seen today for followup. He continues to do very well. He denies any increase shortness of breath, palpitations, or chest pain. He's had no dizziness. He's had no defibrillator discharges. He has a history of coronary disease and is status post CABG in 2001. This included an LIMA graft to the LAD, saphenous vein graft to the first obtuse marginal vessel, and saphenous vein graft to the PDA. Cardiac catheterization in 2005 showed patent grafts. He has a history of chronic atrial for relation. He also has a history of ventricular tachycardia and has had a biventricular ICD placed. He has ischemic cardiomyopathy with ejection fraction of 30-35%. At the time of his cardiac catheterization in 2005 he suffered an embolic stroke. This is managed timely with thrombectomy with an excellent result. He does have chronic kidney disease which has been stable.  Current Outpatient Prescriptions on File Prior to Visit  Medication Sig Dispense Refill  . allopurinol (ZYLOPRIM) 100 MG tablet Take 1 tablet (100 mg total) by mouth daily.  90 tablet  3  . ALPRAZolam (XANAX) 0.5 MG tablet Take 1 tablet (0.5 mg total) by mouth at bedtime as needed.  90 tablet  3  . calcitRIOL (ROCALTROL) 0.25 MCG capsule Take 1 tablet by mouth Daily.      . carvedilol (COREG) 25 MG tablet Take 1 tablet (25 mg total) by mouth 2 (two) times daily.  90 tablet  3  . Cyanocobalamin (VITAMIN B 12 PO) Take by mouth daily.        . digoxin (LANOXIN) 0.125 MG tablet Take 1 tablet (125 mcg total) by mouth daily.  90 tablet  3  . FOLBEE 2.5-25-1 MG TABS TAKE 1 TABLET EVERY DAY  30 tablet  5  . furosemide (LASIX) 40 MG tablet Take 80 mg in am; 40 mg in pm  360 tablet  3  . lovastatin (MEVACOR) 40 MG tablet Take 1 tablet (40 mg total) by mouth at bedtime.  90 tablet  3  . paricalcitol (ZEMPLAR) 1 MCG capsule Take 1 mcg by mouth daily.          Marland Kitchen warfarin (COUMADIN) 1 MG tablet Take 1 tablet (1 mg total) by mouth See admin instructions. 1.5 mg x 3 days; 1 mg x 4 days; take as directed by physician  90 tablet  3  . ALPRAZolam (XANAX) 0.5 MG tablet Take 1 tablet (0.5 mg total) by mouth at bedtime as needed for sleep.  90 tablet  3  . ALPRAZolam (XANAX) 0.5 MG tablet Take 1 tablet (0.5 mg total) by mouth 3 (three) times daily as needed for anxiety.  90 tablet  3    Allergies  Allergen Reactions  . Nitroglycerin   . Other     Intolerance to ACE inhibitors    Past Medical History  Diagnosis Date  . Coronary artery disease   . Cardiomyopathy, ischemic     EF of 30-35%  . Status post CVA   . VT (ventricular tachycardia) aug of 2010    removed old ICD and replaced with biventricular ICD  . CKD (chronic kidney disease) stage 3, GFR 30-59 ml/min   . Atrial fibrillation     chronic  . Hypertension   . Dyslipidemia   . Peripheral vascular disease   . Thyroid disease     hypo,due to amiodarone therapy,resolved  . Myocardial infarction January 2001  History of anteroseptal myocardial infarction presenting with left bundle-branch block  . Anxiety   . Tobacco abuse     History of 2 pack per day tobacco habituation  . Pacemaker     Past Surgical History  Procedure Date  . Cholecystectomy   . Femoral bypass     fem-fem bpg, right fem pop bpg  . Icd 07/15/2009    implant Dr. Ladona Ridgel  . Coronary artery bypass graft 2001    x 4  . Cardiac catheterization 2001    PTCA  . Cardiac catheterization 2005    patent grafts  . Cataracts 2006    right eye    History  Smoking status  . Former Smoker  . Quit date: 09/30/2000  Smokeless tobacco  . Not on file    History  Alcohol Use     Family History  Problem Relation Age of Onset  . Heart disease Mother     Review of Systems: As noted in history of present illness. He did have a recent gastrointestinal illness that he thought was related to food poisoning. All other  systems were reviewed and are negative.  Physical Exam: BP 100/60  Pulse 74  Ht 5\' 8"  (1.727 m)  Wt 159 lb 12.8 oz (72.485 kg)  BMI 24.30 kg/m2 He is a pleasant white male who appears somewhat chronically ill. He is normocephalic, atraumatic. Pupils are equal round and reactive to light and accommodation. Extraocular movements are full. Oropharynx is clear. Neck is supple no JVD, adenopathy, thyromegaly, or bruits. Lungs are clear. His ICD site is stable. Cardiac exam reveals a regular rate and rhythm without gallop, murmur, or click. Abdomen is soft and nontender. There are no masses or bruits. Extremities are without edema. Pedal pulses are good. Skin is warm and dry. He is alert and oriented x3. Cranial nerves II through XII are intact. LABORATORY DATA: Reviewed from the nephrologist in July. Renal function is stable.  Assessment / Plan:

## 2011-10-01 NOTE — Patient Instructions (Signed)
Continue your current therapy.  We will check your coumadin again in 6 weeks.  I will see you again in 6 months.

## 2011-10-01 NOTE — Assessment & Plan Note (Signed)
He is status post CABG in 2001. He remains asymptomatic. Cardiac catheterization 2005 showed patent grafts. We will continue with his current antianginal therapy.

## 2011-11-12 ENCOUNTER — Ambulatory Visit (INDEPENDENT_AMBULATORY_CARE_PROVIDER_SITE_OTHER): Payer: Medicare Other | Admitting: *Deleted

## 2011-11-12 DIAGNOSIS — Z7901 Long term (current) use of anticoagulants: Secondary | ICD-10-CM

## 2011-11-12 DIAGNOSIS — I4891 Unspecified atrial fibrillation: Secondary | ICD-10-CM

## 2011-11-12 DIAGNOSIS — I6350 Cerebral infarction due to unspecified occlusion or stenosis of unspecified cerebral artery: Secondary | ICD-10-CM

## 2011-12-24 ENCOUNTER — Encounter: Payer: Medicare Other | Admitting: *Deleted

## 2011-12-31 ENCOUNTER — Ambulatory Visit (INDEPENDENT_AMBULATORY_CARE_PROVIDER_SITE_OTHER): Payer: Medicare Other | Admitting: *Deleted

## 2011-12-31 DIAGNOSIS — I6350 Cerebral infarction due to unspecified occlusion or stenosis of unspecified cerebral artery: Secondary | ICD-10-CM

## 2011-12-31 DIAGNOSIS — Z7901 Long term (current) use of anticoagulants: Secondary | ICD-10-CM

## 2011-12-31 DIAGNOSIS — I4891 Unspecified atrial fibrillation: Secondary | ICD-10-CM

## 2011-12-31 LAB — POCT INR: INR: 3.2

## 2012-01-02 ENCOUNTER — Telehealth: Payer: Self-pay | Admitting: Pharmacist

## 2012-01-02 DIAGNOSIS — I4891 Unspecified atrial fibrillation: Secondary | ICD-10-CM

## 2012-01-02 MED ORDER — WARFARIN SODIUM 1 MG PO TABS: ORAL_TABLET | ORAL | Status: DC

## 2012-01-02 NOTE — Telephone Encounter (Signed)
Rx for Coumadin sent

## 2012-01-04 ENCOUNTER — Other Ambulatory Visit: Payer: Self-pay

## 2012-01-04 DIAGNOSIS — I2510 Atherosclerotic heart disease of native coronary artery without angina pectoris: Secondary | ICD-10-CM

## 2012-01-04 MED ORDER — DIGOXIN 125 MCG PO TABS: 125.0000 ug | ORAL_TABLET | Freq: Every day | ORAL | Status: DC

## 2012-01-14 ENCOUNTER — Telehealth: Payer: Self-pay | Admitting: Cardiology

## 2012-01-14 DIAGNOSIS — I4891 Unspecified atrial fibrillation: Secondary | ICD-10-CM

## 2012-01-14 MED ORDER — WARFARIN SODIUM 1 MG PO TABS: ORAL_TABLET | ORAL | Status: DC

## 2012-01-14 NOTE — Telephone Encounter (Signed)
Pt' wife calling re warfarin 1mg  refill, I told her it was called in 01-02-12, as of this am ,they say they dont have it , can it be called in again today? Uses [employer] mail

## 2012-01-25 ENCOUNTER — Telehealth: Payer: Self-pay | Admitting: Internal Medicine

## 2012-01-25 NOTE — Telephone Encounter (Signed)
01-25-12 LMM @ 1113A for pt to call to set up defib ck with taylor was due January/mt

## 2012-02-11 NOTE — Progress Notes (Signed)
Addended by: Judithe Modest D on: 02/11/2012 10:02 AM   Modules accepted: Orders

## 2012-02-19 ENCOUNTER — Other Ambulatory Visit: Payer: Self-pay

## 2012-02-19 DIAGNOSIS — E785 Hyperlipidemia, unspecified: Secondary | ICD-10-CM

## 2012-02-19 MED ORDER — LOVASTATIN 40 MG PO TABS: 40.0000 mg | ORAL_TABLET | Freq: Every day | ORAL | Status: DC

## 2012-02-19 MED ORDER — ALLOPURINOL 100 MG PO TABS: 100.0000 mg | ORAL_TABLET | Freq: Every day | ORAL | Status: DC

## 2012-02-25 ENCOUNTER — Encounter: Payer: Self-pay | Admitting: Internal Medicine

## 2012-02-25 ENCOUNTER — Ambulatory Visit (INDEPENDENT_AMBULATORY_CARE_PROVIDER_SITE_OTHER): Payer: Medicare Other | Admitting: Internal Medicine

## 2012-02-25 VITALS — BP 110/58 | HR 60 | Wt 164.2 lb

## 2012-02-25 DIAGNOSIS — Z95810 Presence of automatic (implantable) cardiac defibrillator: Secondary | ICD-10-CM

## 2012-02-25 DIAGNOSIS — I255 Ischemic cardiomyopathy: Secondary | ICD-10-CM

## 2012-02-25 DIAGNOSIS — I472 Ventricular tachycardia, unspecified: Secondary | ICD-10-CM

## 2012-02-25 DIAGNOSIS — I5022 Chronic systolic (congestive) heart failure: Secondary | ICD-10-CM

## 2012-02-25 DIAGNOSIS — I2589 Other forms of chronic ischemic heart disease: Secondary | ICD-10-CM

## 2012-02-25 DIAGNOSIS — I4891 Unspecified atrial fibrillation: Secondary | ICD-10-CM

## 2012-02-25 DIAGNOSIS — I2510 Atherosclerotic heart disease of native coronary artery without angina pectoris: Secondary | ICD-10-CM

## 2012-02-25 LAB — ICD DEVICE OBSERVATION
AL IMPEDENCE ICD: 475 Ohm
BATTERY VOLTAGE: 2.86 V
LV LEAD IMPEDENCE ICD: 342 Ohm
RV LEAD IMPEDENCE ICD: 684 Ohm
TZAT-0001ATACH: 3
TZAT-0001FASTVT: 1
TZAT-0001SLOWVT: 1
TZAT-0001SLOWVT: 2
TZAT-0002ATACH: NEGATIVE
TZAT-0002FASTVT: NEGATIVE
TZAT-0004SLOWVT: 8
TZAT-0004SLOWVT: 8
TZAT-0012ATACH: 150 ms
TZAT-0012ATACH: 150 ms
TZAT-0012ATACH: 150 ms
TZAT-0012FASTVT: 200 ms
TZAT-0012SLOWVT: 200 ms
TZAT-0012SLOWVT: 200 ms
TZAT-0013SLOWVT: 2
TZAT-0013SLOWVT: 2
TZAT-0018ATACH: NEGATIVE
TZAT-0018FASTVT: NEGATIVE
TZAT-0019ATACH: 6 V
TZAT-0019SLOWVT: 8 V
TZAT-0019SLOWVT: 8 V
TZAT-0020ATACH: 1.5 ms
TZAT-0020ATACH: 1.5 ms
TZAT-0020SLOWVT: 1.5 ms
TZAT-0020SLOWVT: 1.5 ms
TZON-0003ATACH: 350 ms
TZON-0003SLOWVT: 400 ms
TZON-0003VSLOWVT: 450 ms
TZON-0004SLOWVT: 16
TZON-0004VSLOWVT: 20
TZST-0001ATACH: 4
TZST-0001ATACH: 6
TZST-0001FASTVT: 4
TZST-0001FASTVT: 6
TZST-0001SLOWVT: 3
TZST-0001SLOWVT: 5
TZST-0002ATACH: NEGATIVE
TZST-0002ATACH: NEGATIVE
TZST-0002ATACH: NEGATIVE
TZST-0002FASTVT: NEGATIVE
TZST-0002FASTVT: NEGATIVE
TZST-0002FASTVT: NEGATIVE
TZST-0003SLOWVT: 35 J
TZST-0003SLOWVT: 35 J
VENTRICULAR PACING ICD: 81.5 pct

## 2012-02-25 NOTE — Assessment & Plan Note (Signed)
His symptoms are currently class II. I discussed the importance of maintaining a low sodium diet. He admits to intermittent sodium noncompliance. He will continue his current medical therapy.

## 2012-02-25 NOTE — Assessment & Plan Note (Signed)
He denies any recurrent ICD shocks. His ventricular tachycardia appears to be well-controlled.

## 2012-02-25 NOTE — Assessment & Plan Note (Signed)
His device is working normally. We'll plan to recheck in several months. 

## 2012-02-25 NOTE — Patient Instructions (Addendum)
Remote monitoring is used to monitor your Pacemaker of ICD from home. This monitoring reduces the number of office visits required to check your device to one time per year. It allows us to keep an eye on the functioning of your device to ensure it is working properly. You are scheduled for a device check from home on May 29, 2012. You may send your transmission at any time that day. If you have a wireless device, the transmission will be sent automatically. After your physician reviews your transmission, you will receive a postcard with your next transmission date.  Your physician wants you to follow-up in: 1 year with Dr Taylor. You will receive a reminder letter in the mail two months in advance. If you don't receive a letter, please call our office to schedule the follow-up appointment.  

## 2012-02-25 NOTE — Assessment & Plan Note (Signed)
He denies anginal symptoms. He'll continue his current medical therapy. 

## 2012-02-25 NOTE — Assessment & Plan Note (Signed)
He is now chronically in atrial fibrillation. His ventricular rate appears to be well controlled. In fact he has underlying complete heart block.

## 2012-02-25 NOTE — Progress Notes (Signed)
HPI   Mr. Lall returns today for followup. He is a 71 year old man with an ischemic cardiomyopathy, chronic systolic heart failure, ventricular tachycardia, status post ICD implantation. In the interim he has do he denies chest pain, shortness of breath, or peripheral edema. He doesn't make to sodium indiscretion. He has had no shocks. No syncope.ne well. Allergies  Allergen Reactions  . Nitroglycerin   . Other     Intolerance to ACE inhibitors     Current Outpatient Prescriptions  Medication Sig Dispense Refill  . allopurinol (ZYLOPRIM) 100 MG tablet Take 1 tablet (100 mg total) by mouth daily.  90 tablet  3  . ALPRAZolam (XANAX) 0.5 MG tablet Take 1 tablet (0.5 mg total) by mouth at bedtime as needed.  90 tablet  3  . calcitRIOL (ROCALTROL) 0.25 MCG capsule Take 1 tablet by mouth Daily.      . carvedilol (COREG) 25 MG tablet Take 1 tablet (25 mg total) by mouth 2 (two) times daily.  90 tablet  3  . Cyanocobalamin (VITAMIN B 12 PO) Take by mouth daily.        . digoxin (LANOXIN) 0.125 MG tablet Take 1 tablet (125 mcg total) by mouth daily.  90 tablet  3  . FOLBEE 2.5-25-1 MG TABS TAKE 1 TABLET EVERY DAY  30 tablet  5  . furosemide (LASIX) 40 MG tablet Take 80 mg in am; 40 mg in pm  360 tablet  3  . lovastatin (MEVACOR) 40 MG tablet Take 1 tablet (40 mg total) by mouth at bedtime.  90 tablet  3  . warfarin (COUMADIN) 1 MG tablet Take as directed by Anticoagulation clinic.  Pt takes up to 1 1/2 tablets daily.  100 tablet  1  . ALPRAZolam (XANAX) 0.5 MG tablet Take 1 tablet (0.5 mg total) by mouth at bedtime as needed for sleep.  90 tablet  3  . ALPRAZolam (XANAX) 0.5 MG tablet Take 1 tablet (0.5 mg total) by mouth 3 (three) times daily as needed for anxiety.  90 tablet  3     Past Medical History  Diagnosis Date  . Coronary artery disease   . Cardiomyopathy, ischemic     EF of 30-35%  . Status post CVA   . VT (ventricular tachycardia) aug of 2010    removed old ICD and replaced  with biventricular ICD  . CKD (chronic kidney disease) stage 3, GFR 30-59 ml/min   . Atrial fibrillation     chronic  . Hypertension   . Dyslipidemia   . Peripheral vascular disease   . Thyroid disease     hypo,due to amiodarone therapy,resolved  . Myocardial infarction January 2001     History of anteroseptal myocardial infarction presenting with left bundle-branch block  . Anxiety   . Tobacco abuse     History of 2 pack per day tobacco habituation  . Pacemaker   . CHF (congestive heart failure)     ROS:   All systems reviewed and negative except as noted in the HPI.   Past Surgical History  Procedure Date  . Cholecystectomy   . Femoral bypass     fem-fem bpg, right fem pop bpg  . Icd 07/15/2009    implant Dr. Ladona Ridgel  . Coronary artery bypass graft 2001    x 4  . Cardiac catheterization 2001    PTCA  . Cardiac catheterization 2005    patent grafts  . Cataracts 2006    right eye  Family History  Problem Relation Age of Onset  . Heart disease Mother   . Aneurysm Brother   . Stroke Mother   . Stroke Father      History   Social History  . Marital Status: Married    Spouse Name: N/A    Number of Children: 2  . Years of Education: N/A   Occupational History  . Psychologist, prison and probation services    Social History Main Topics  . Smoking status: Former Smoker    Quit date: 09/30/2000  . Smokeless tobacco: Not on file  . Alcohol Use:   . Drug Use:   . Sexually Active:    Other Topics Concern  . Not on file   Social History Narrative  . No narrative on file     BP 110/58  Pulse 60  Wt 74.481 kg (164 lb 3.2 oz)  Physical Exam:  Well appearing NAD HEENT: Unremarkable Neck:  No JVD, no thyromegally Lungs:  ClearWith no wheezes, rales, or rhonchi. Well-healed ICD incision. HEART:  Regular rate rhythm, no murmurs, no rubs, no clicks Abd:  soft, positive bowel sounds, no organomegally, no rebound, no guarding Ext:  2 plus pulses, no edema, no cyanosis, no  clubbing Skin:  No rashes no nodules Neuro:  CN II through XII intact, motor grossly intact   DEVICE  Normal device function.  See PaceArt for details.   Assess/Plan:

## 2012-03-10 ENCOUNTER — Other Ambulatory Visit: Payer: Self-pay

## 2012-03-10 DIAGNOSIS — I2510 Atherosclerotic heart disease of native coronary artery without angina pectoris: Secondary | ICD-10-CM

## 2012-03-10 MED ORDER — CARVEDILOL 25 MG PO TABS: 25.0000 mg | ORAL_TABLET | Freq: Two times a day (BID) | ORAL | Status: DC

## 2012-03-17 ENCOUNTER — Encounter: Payer: Self-pay | Admitting: Cardiology

## 2012-03-18 ENCOUNTER — Ambulatory Visit (INDEPENDENT_AMBULATORY_CARE_PROVIDER_SITE_OTHER): Payer: Medicare Other | Admitting: Pharmacist

## 2012-03-18 DIAGNOSIS — I6350 Cerebral infarction due to unspecified occlusion or stenosis of unspecified cerebral artery: Secondary | ICD-10-CM

## 2012-03-18 DIAGNOSIS — I4891 Unspecified atrial fibrillation: Secondary | ICD-10-CM

## 2012-03-18 DIAGNOSIS — Z7901 Long term (current) use of anticoagulants: Secondary | ICD-10-CM

## 2012-03-19 ENCOUNTER — Other Ambulatory Visit: Payer: Self-pay

## 2012-03-19 ENCOUNTER — Telehealth: Payer: Self-pay

## 2012-03-19 NOTE — Telephone Encounter (Signed)
Spoke to pharmacist at [facility] [place] ok to change alprazolam directions to one tablet twice daily.Patient states he takes 1 twice daily.

## 2012-03-20 ENCOUNTER — Telehealth: Payer: Self-pay | Admitting: Cardiology

## 2012-03-20 NOTE — Telephone Encounter (Signed)
New Problem:    Patient called in needing a refill of his ALPRAZolam (XANAX) 0.5 MG tablet filled at the [facility] in [place] (phone#- [phone]).  This is only a temporary pharmacy for this one prescription. Please call back if you have a question.

## 2012-03-20 NOTE — Telephone Encounter (Signed)
Spoke to pharmacist Olegario Messier at [facility] was told ok with Dr.Jordan to change alprazolam 0.5 mg twice daily # 60 with 1 refill.

## 2012-04-01 ENCOUNTER — Ambulatory Visit (INDEPENDENT_AMBULATORY_CARE_PROVIDER_SITE_OTHER): Payer: Medicare Other | Admitting: *Deleted

## 2012-04-01 DIAGNOSIS — I6350 Cerebral infarction due to unspecified occlusion or stenosis of unspecified cerebral artery: Secondary | ICD-10-CM

## 2012-04-01 DIAGNOSIS — Z7901 Long term (current) use of anticoagulants: Secondary | ICD-10-CM

## 2012-04-01 DIAGNOSIS — I4891 Unspecified atrial fibrillation: Secondary | ICD-10-CM

## 2012-04-01 LAB — POCT INR: INR: 3.4

## 2012-04-22 ENCOUNTER — Ambulatory Visit (INDEPENDENT_AMBULATORY_CARE_PROVIDER_SITE_OTHER): Payer: Medicare Other | Admitting: Pharmacist

## 2012-04-22 DIAGNOSIS — I4891 Unspecified atrial fibrillation: Secondary | ICD-10-CM

## 2012-04-22 DIAGNOSIS — Z7901 Long term (current) use of anticoagulants: Secondary | ICD-10-CM

## 2012-04-22 DIAGNOSIS — I6350 Cerebral infarction due to unspecified occlusion or stenosis of unspecified cerebral artery: Secondary | ICD-10-CM

## 2012-04-22 LAB — POCT INR: INR: 3.1

## 2012-04-23 ENCOUNTER — Other Ambulatory Visit: Payer: Self-pay | Admitting: Cardiology

## 2012-04-23 DIAGNOSIS — I4891 Unspecified atrial fibrillation: Secondary | ICD-10-CM

## 2012-04-23 MED ORDER — WARFARIN SODIUM 1 MG PO TABS: ORAL_TABLET | ORAL | Status: DC

## 2012-05-13 ENCOUNTER — Ambulatory Visit (INDEPENDENT_AMBULATORY_CARE_PROVIDER_SITE_OTHER): Payer: Medicare Other | Admitting: Pharmacist

## 2012-05-13 DIAGNOSIS — I4891 Unspecified atrial fibrillation: Secondary | ICD-10-CM

## 2012-05-13 DIAGNOSIS — Z7901 Long term (current) use of anticoagulants: Secondary | ICD-10-CM

## 2012-05-13 DIAGNOSIS — I6350 Cerebral infarction due to unspecified occlusion or stenosis of unspecified cerebral artery: Secondary | ICD-10-CM

## 2012-05-13 LAB — POCT INR: INR: 2.8

## 2012-05-14 ENCOUNTER — Other Ambulatory Visit: Payer: Self-pay | Admitting: Internal Medicine

## 2012-05-14 NOTE — Telephone Encounter (Signed)
[facility] [place] [phone].

## 2012-05-15 ENCOUNTER — Other Ambulatory Visit: Payer: Self-pay | Admitting: Cardiology

## 2012-05-15 MED ORDER — ALPRAZOLAM 0.5 MG PO TABS: 0.5000 mg | ORAL_TABLET | Freq: Every evening | ORAL | Status: DC | PRN

## 2012-05-15 NOTE — Telephone Encounter (Signed)
Refill- ALPRAZolam (XANAX) 0.5 MG tablet  Patient said he never received his medication. I have reordered xanax prescription, verified preferred as [facility] [place]. Patient can be reached at hm# for additional information.    Please return call to patient [phone] regarding medication dosages.

## 2012-05-15 NOTE — Telephone Encounter (Signed)
Received a fax from [facility] [place] requesting alprazolam refill.Spoke to pharmacist at [facility] [place] was told ok to refill alprazolam 0.5 mg bid #60 refill x 1.

## 2012-05-29 ENCOUNTER — Encounter: Payer: Medicare Other | Admitting: *Deleted

## 2012-06-03 ENCOUNTER — Ambulatory Visit (INDEPENDENT_AMBULATORY_CARE_PROVIDER_SITE_OTHER): Payer: Medicare Other | Admitting: Pharmacist

## 2012-06-03 DIAGNOSIS — I6350 Cerebral infarction due to unspecified occlusion or stenosis of unspecified cerebral artery: Secondary | ICD-10-CM

## 2012-06-03 DIAGNOSIS — Z7901 Long term (current) use of anticoagulants: Secondary | ICD-10-CM

## 2012-06-03 DIAGNOSIS — I4891 Unspecified atrial fibrillation: Secondary | ICD-10-CM

## 2012-06-05 ENCOUNTER — Telehealth: Payer: Self-pay | Admitting: Cardiology

## 2012-06-05 MED ORDER — ALPRAZOLAM 0.5 MG PO TABS: ORAL_TABLET | ORAL | Status: DC

## 2012-06-05 NOTE — Telephone Encounter (Signed)
Patient wife [name] [name] returning nurse call, she can be reached at [phone].

## 2012-06-05 NOTE — Telephone Encounter (Signed)
Spoke to patient's wife she stated alprazolam 0.5 mg needs to say take one twice a day as needed.Stated patient wants 90 day supply sent to [facility] [place].

## 2012-06-09 ENCOUNTER — Encounter: Payer: Self-pay | Admitting: *Deleted

## 2012-06-11 ENCOUNTER — Telehealth: Payer: Self-pay | Admitting: Cardiology

## 2012-06-11 MED ORDER — ALPRAZOLAM 0.5 MG PO TABS: ORAL_TABLET | ORAL | Status: DC

## 2012-06-11 NOTE — Telephone Encounter (Signed)
Spoke to patient's wife directions wrong on alprazolam.States needs to say alprazolam 0.5 mg three times a day as needed. # 90.No refills.

## 2012-06-11 NOTE — Telephone Encounter (Signed)
Pt rtn your call

## 2012-06-11 NOTE — Telephone Encounter (Signed)
New msg [facility] [place] wants to clarify quantity and directions for alprazolam .5 mg Please call back

## 2012-06-16 ENCOUNTER — Other Ambulatory Visit: Payer: Self-pay | Admitting: *Deleted

## 2012-06-16 DIAGNOSIS — I2510 Atherosclerotic heart disease of native coronary artery without angina pectoris: Secondary | ICD-10-CM

## 2012-06-16 MED ORDER — FUROSEMIDE 40 MG PO TABS: ORAL_TABLET | ORAL | Status: DC

## 2012-06-16 NOTE — Telephone Encounter (Signed)
Refilled furosemide

## 2012-06-19 ENCOUNTER — Telehealth: Payer: Self-pay | Admitting: Internal Medicine

## 2012-06-19 NOTE — Telephone Encounter (Signed)
New msg Pt wants to discuss transmission that was sent today

## 2012-06-20 NOTE — Telephone Encounter (Signed)
Transmission was not received, patient to resend.  

## 2012-07-01 ENCOUNTER — Ambulatory Visit (INDEPENDENT_AMBULATORY_CARE_PROVIDER_SITE_OTHER): Payer: Medicare Other

## 2012-07-01 DIAGNOSIS — I6350 Cerebral infarction due to unspecified occlusion or stenosis of unspecified cerebral artery: Secondary | ICD-10-CM

## 2012-07-01 DIAGNOSIS — Z7901 Long term (current) use of anticoagulants: Secondary | ICD-10-CM

## 2012-07-01 DIAGNOSIS — I4891 Unspecified atrial fibrillation: Secondary | ICD-10-CM

## 2012-07-14 ENCOUNTER — Encounter (INDEPENDENT_AMBULATORY_CARE_PROVIDER_SITE_OTHER): Payer: Medicare Other | Admitting: Ophthalmology

## 2012-07-14 DIAGNOSIS — H43819 Vitreous degeneration, unspecified eye: Secondary | ICD-10-CM

## 2012-07-14 DIAGNOSIS — H3530 Unspecified macular degeneration: Secondary | ICD-10-CM

## 2012-07-14 DIAGNOSIS — I10 Essential (primary) hypertension: Secondary | ICD-10-CM

## 2012-07-14 DIAGNOSIS — H35039 Hypertensive retinopathy, unspecified eye: Secondary | ICD-10-CM

## 2012-07-29 ENCOUNTER — Ambulatory Visit (INDEPENDENT_AMBULATORY_CARE_PROVIDER_SITE_OTHER): Payer: Medicare Other | Admitting: *Deleted

## 2012-07-29 DIAGNOSIS — Z7901 Long term (current) use of anticoagulants: Secondary | ICD-10-CM

## 2012-07-29 DIAGNOSIS — I6350 Cerebral infarction due to unspecified occlusion or stenosis of unspecified cerebral artery: Secondary | ICD-10-CM

## 2012-07-29 DIAGNOSIS — I4891 Unspecified atrial fibrillation: Secondary | ICD-10-CM

## 2012-07-29 LAB — POCT INR: INR: 2.9

## 2012-08-01 ENCOUNTER — Other Ambulatory Visit: Payer: Self-pay

## 2012-08-01 ENCOUNTER — Telehealth: Payer: Self-pay

## 2012-08-01 MED ORDER — ALPRAZOLAM 0.5 MG PO TABS: ORAL_TABLET | ORAL | Status: DC

## 2012-08-01 NOTE — Telephone Encounter (Signed)
Patient called no answer.Left message on personal voice mail alprazolam renewed.Spoke to pharmacist at [facility] [place] alprazolam 0.5 mg # 90 no refills.

## 2012-08-01 NOTE — Telephone Encounter (Signed)
Patient called no answer.Left message on personal voice mail alprazolam refilled. Spoke to [facility]ist at [facility] [place] Alprazolam 0.5 mg # 90 no refills.

## 2012-08-06 ENCOUNTER — Encounter: Payer: Self-pay | Admitting: *Deleted

## 2012-08-19 ENCOUNTER — Ambulatory Visit (INDEPENDENT_AMBULATORY_CARE_PROVIDER_SITE_OTHER): Payer: Medicare Other | Admitting: *Deleted

## 2012-08-19 ENCOUNTER — Encounter: Payer: Self-pay | Admitting: Internal Medicine

## 2012-08-19 ENCOUNTER — Telehealth: Payer: Self-pay | Admitting: Cardiology

## 2012-08-19 DIAGNOSIS — I2589 Other forms of chronic ischemic heart disease: Secondary | ICD-10-CM

## 2012-08-19 DIAGNOSIS — Z95810 Presence of automatic (implantable) cardiac defibrillator: Secondary | ICD-10-CM

## 2012-08-19 DIAGNOSIS — I255 Ischemic cardiomyopathy: Secondary | ICD-10-CM

## 2012-08-19 DIAGNOSIS — I5022 Chronic systolic (congestive) heart failure: Secondary | ICD-10-CM

## 2012-08-19 NOTE — Telephone Encounter (Signed)
Pt did transmisson this am, did we receive , psl call

## 2012-08-19 NOTE — Telephone Encounter (Signed)
LMOM in regards to transmission that was sent. Transmission was received.

## 2012-08-21 LAB — REMOTE ICD DEVICE
AL AMPLITUDE: 1.1 mv
ATRIAL PACING ICD: 0 pct
BRDY-0002LV: 60 {beats}/min
BRDY-0004LV: 120 {beats}/min
CHARGE TIME: 12.492 s
FVT: 0
RV LEAD IMPEDENCE ICD: 703 Ohm
TOT-0001: 1
TZAT-0001ATACH: 2
TZAT-0002ATACH: NEGATIVE
TZAT-0004SLOWVT: 8
TZAT-0004SLOWVT: 8
TZAT-0011SLOWVT: 10 ms
TZAT-0011SLOWVT: 10 ms
TZAT-0012ATACH: 150 ms
TZAT-0012ATACH: 150 ms
TZAT-0012FASTVT: 200 ms
TZAT-0012SLOWVT: 200 ms
TZAT-0012SLOWVT: 200 ms
TZAT-0018ATACH: NEGATIVE
TZAT-0018FASTVT: NEGATIVE
TZAT-0019ATACH: 6 V
TZAT-0019ATACH: 6 V
TZAT-0019FASTVT: 8 V
TZAT-0020ATACH: 1.5 ms
TZAT-0020ATACH: 1.5 ms
TZAT-0020ATACH: 1.5 ms
TZAT-0020FASTVT: 1.5 ms
TZAT-0020SLOWVT: 1.5 ms
TZAT-0020SLOWVT: 1.5 ms
TZON-0003ATACH: 350 ms
TZON-0003SLOWVT: 400 ms
TZON-0003VSLOWVT: 450 ms
TZON-0004VSLOWVT: 20
TZST-0001ATACH: 4
TZST-0001FASTVT: 2
TZST-0001FASTVT: 6
TZST-0001SLOWVT: 5
TZST-0002ATACH: NEGATIVE
TZST-0002FASTVT: NEGATIVE
TZST-0002FASTVT: NEGATIVE
TZST-0003SLOWVT: 15 J
TZST-0003SLOWVT: 35 J
TZST-0003SLOWVT: 35 J
VENTRICULAR PACING ICD: 81.64 pct

## 2012-08-27 ENCOUNTER — Ambulatory Visit (INDEPENDENT_AMBULATORY_CARE_PROVIDER_SITE_OTHER): Payer: Medicare Other | Admitting: *Deleted

## 2012-08-27 DIAGNOSIS — I6350 Cerebral infarction due to unspecified occlusion or stenosis of unspecified cerebral artery: Secondary | ICD-10-CM

## 2012-08-27 DIAGNOSIS — Z7901 Long term (current) use of anticoagulants: Secondary | ICD-10-CM

## 2012-08-27 DIAGNOSIS — I4891 Unspecified atrial fibrillation: Secondary | ICD-10-CM

## 2012-08-27 LAB — POCT INR: INR: 2.7

## 2012-09-03 ENCOUNTER — Encounter: Payer: Self-pay | Admitting: *Deleted

## 2012-09-15 ENCOUNTER — Encounter: Payer: Medicare Other | Admitting: *Deleted

## 2012-09-24 ENCOUNTER — Ambulatory Visit (INDEPENDENT_AMBULATORY_CARE_PROVIDER_SITE_OTHER): Payer: Medicare Other | Admitting: *Deleted

## 2012-09-24 ENCOUNTER — Encounter: Payer: Self-pay | Admitting: *Deleted

## 2012-09-24 DIAGNOSIS — Z7901 Long term (current) use of anticoagulants: Secondary | ICD-10-CM

## 2012-09-24 DIAGNOSIS — I4891 Unspecified atrial fibrillation: Secondary | ICD-10-CM

## 2012-09-24 DIAGNOSIS — I6350 Cerebral infarction due to unspecified occlusion or stenosis of unspecified cerebral artery: Secondary | ICD-10-CM

## 2012-09-24 LAB — POCT INR: INR: 3.2

## 2012-09-29 ENCOUNTER — Other Ambulatory Visit: Payer: Self-pay | Admitting: *Deleted

## 2012-09-29 DIAGNOSIS — I2510 Atherosclerotic heart disease of native coronary artery without angina pectoris: Secondary | ICD-10-CM

## 2012-09-29 MED ORDER — CARVEDILOL 25 MG PO TABS: 25.0000 mg | ORAL_TABLET | Freq: Two times a day (BID) | ORAL | Status: DC

## 2012-10-03 ENCOUNTER — Telehealth: Payer: Self-pay

## 2012-10-03 NOTE — Telephone Encounter (Signed)
Spoke to pharmacist at [facility] [place] was told okay to refill alprazolam 0.5 mg # 90 refill x 2.

## 2012-10-07 ENCOUNTER — Telehealth: Payer: Self-pay | Admitting: *Deleted

## 2012-10-07 NOTE — Telephone Encounter (Signed)
Pt called in regards to device beeping. Pt was instructed to send transmission. As of 1513 transmission has not been received. Tried to call pt but N/A. Will try and reach later.

## 2012-10-10 NOTE — Telephone Encounter (Signed)
Follow-up:    Patient called in wanting to know if her husbands transmission was received.  Please call back.

## 2012-10-10 NOTE — Telephone Encounter (Signed)
Transmission not received. Spoke with daughter and scheduled pt for 11-04-12 with GT. Also pt has Coumadin appt on 10-15-12. Device to be checked at this appt to confirm ERI.

## 2012-10-15 ENCOUNTER — Ambulatory Visit (INDEPENDENT_AMBULATORY_CARE_PROVIDER_SITE_OTHER): Payer: Medicare Other | Admitting: *Deleted

## 2012-10-15 ENCOUNTER — Other Ambulatory Visit: Payer: Self-pay | Admitting: *Deleted

## 2012-10-15 ENCOUNTER — Encounter: Payer: Self-pay | Admitting: *Deleted

## 2012-10-15 ENCOUNTER — Encounter: Payer: Self-pay | Admitting: Internal Medicine

## 2012-10-15 ENCOUNTER — Other Ambulatory Visit: Payer: Self-pay

## 2012-10-15 DIAGNOSIS — I2510 Atherosclerotic heart disease of native coronary artery without angina pectoris: Secondary | ICD-10-CM

## 2012-10-15 DIAGNOSIS — Z7901 Long term (current) use of anticoagulants: Secondary | ICD-10-CM

## 2012-10-15 DIAGNOSIS — I4891 Unspecified atrial fibrillation: Secondary | ICD-10-CM

## 2012-10-15 DIAGNOSIS — I6350 Cerebral infarction due to unspecified occlusion or stenosis of unspecified cerebral artery: Secondary | ICD-10-CM

## 2012-10-15 DIAGNOSIS — I2589 Other forms of chronic ischemic heart disease: Secondary | ICD-10-CM

## 2012-10-15 DIAGNOSIS — I5022 Chronic systolic (congestive) heart failure: Secondary | ICD-10-CM

## 2012-10-15 DIAGNOSIS — I255 Ischemic cardiomyopathy: Secondary | ICD-10-CM

## 2012-10-15 LAB — ICD DEVICE OBSERVATION
AL AMPLITUDE: 1.375 mv
BATTERY VOLTAGE: 2.6152 V
BRDY-0002LV: 60 {beats}/min
BRDY-0004LV: 120 {beats}/min
FVT: 0
LV LEAD THRESHOLD: 2 V
PACEART VT: 0
RV LEAD AMPLITUDE: 14.125 mv
RV LEAD IMPEDENCE ICD: 741 Ohm
TOT-0001: 1
TZAT-0001ATACH: 3
TZAT-0001FASTVT: 1
TZAT-0001SLOWVT: 1
TZAT-0001SLOWVT: 2
TZAT-0002FASTVT: NEGATIVE
TZAT-0012ATACH: 150 ms
TZAT-0012ATACH: 150 ms
TZAT-0012ATACH: 150 ms
TZAT-0012FASTVT: 200 ms
TZAT-0012SLOWVT: 200 ms
TZAT-0012SLOWVT: 200 ms
TZAT-0013SLOWVT: 2
TZAT-0013SLOWVT: 2
TZAT-0018FASTVT: NEGATIVE
TZAT-0018SLOWVT: NEGATIVE
TZAT-0019SLOWVT: 8 V
TZAT-0019SLOWVT: 8 V
TZAT-0020ATACH: 1.5 ms
TZAT-0020ATACH: 1.5 ms
TZAT-0020SLOWVT: 1.5 ms
TZAT-0020SLOWVT: 1.5 ms
TZON-0003ATACH: 350 ms
TZON-0003SLOWVT: 400 ms
TZON-0003VSLOWVT: 450 ms
TZON-0004SLOWVT: 16
TZST-0001ATACH: 4
TZST-0001ATACH: 6
TZST-0001FASTVT: 2
TZST-0001FASTVT: 4
TZST-0001SLOWVT: 5
TZST-0002ATACH: NEGATIVE
TZST-0002ATACH: NEGATIVE
TZST-0002FASTVT: NEGATIVE
TZST-0002FASTVT: NEGATIVE
TZST-0002FASTVT: NEGATIVE
TZST-0003SLOWVT: 35 J
TZST-0003SLOWVT: 35 J
VF: 0

## 2012-10-15 LAB — POCT INR: INR: 3.1

## 2012-10-15 MED ORDER — WARFARIN SODIUM 1 MG PO TABS: ORAL_TABLET | ORAL | Status: DC

## 2012-10-15 MED ORDER — DIGOXIN 125 MCG PO TABS: 125.0000 ug | ORAL_TABLET | Freq: Every day | ORAL | Status: DC

## 2012-10-15 NOTE — Progress Notes (Signed)
ICD interrogation for ERI

## 2012-11-04 ENCOUNTER — Ambulatory Visit (INDEPENDENT_AMBULATORY_CARE_PROVIDER_SITE_OTHER): Payer: Medicare Other | Admitting: Internal Medicine

## 2012-11-04 ENCOUNTER — Encounter: Payer: Self-pay | Admitting: *Deleted

## 2012-11-04 ENCOUNTER — Ambulatory Visit (INDEPENDENT_AMBULATORY_CARE_PROVIDER_SITE_OTHER): Payer: Medicare Other | Admitting: *Deleted

## 2012-11-04 ENCOUNTER — Encounter: Payer: Self-pay | Admitting: Internal Medicine

## 2012-11-04 VITALS — BP 108/62 | HR 74 | Wt 159.0 lb

## 2012-11-04 DIAGNOSIS — I472 Ventricular tachycardia, unspecified: Secondary | ICD-10-CM

## 2012-11-04 DIAGNOSIS — Z45018 Encounter for adjustment and management of other part of cardiac pacemaker: Secondary | ICD-10-CM

## 2012-11-04 DIAGNOSIS — Z7901 Long term (current) use of anticoagulants: Secondary | ICD-10-CM

## 2012-11-04 DIAGNOSIS — Z95810 Presence of automatic (implantable) cardiac defibrillator: Secondary | ICD-10-CM

## 2012-11-04 DIAGNOSIS — I4891 Unspecified atrial fibrillation: Secondary | ICD-10-CM

## 2012-11-04 DIAGNOSIS — I6350 Cerebral infarction due to unspecified occlusion or stenosis of unspecified cerebral artery: Secondary | ICD-10-CM

## 2012-11-04 NOTE — Assessment & Plan Note (Signed)
He has had no recurrent ventricular rate tachycardia. He'll continue his current medical therapy.

## 2012-11-04 NOTE — Assessment & Plan Note (Signed)
His [employer] biventricular ICD has reached elective replacement. We will schedule ICD generator change in several weeks.

## 2012-11-04 NOTE — Progress Notes (Signed)
HPI Mr. Diamond returns today for followup. He is a 71-year-old man with a history of an ischemic cardiomyopathy, chronic systolic heart failure, left bundle branch block, and ventricular tachycardia. He is status post biventricular ICD implantation. The patient denies chest pain. His shortness of breath remains class II. No syncope and no recent ICD shocks. Allergies  Allergen Reactions  . Nitroglycerin   . Other     Intolerance to ACE inhibitors     Current Outpatient Prescriptions  Medication Sig Dispense Refill  . allopurinol (ZYLOPRIM) 100 MG tablet Take 1 tablet (100 mg total) by mouth daily.  90 tablet  3  . ALPRAZolam (XANAX) 0.5 MG tablet Take 1 tablet (0.5 mg total) by mouth at bedtime as needed for sleep.  90 tablet  3  . calcitRIOL (ROCALTROL) 0.25 MCG capsule Take 1 tablet by mouth Daily.      . carvedilol (COREG) 25 MG tablet Take 1 tablet (25 mg total) by mouth 2 (two) times daily.  180 tablet  1  . Cyanocobalamin (VITAMIN B 12 PO) Take by mouth daily.        . digoxin (LANOXIN) 0.125 MG tablet Take 1 tablet (125 mcg total) by mouth daily.  90 tablet  3  . furosemide (LASIX) 40 MG tablet Take 80 mg in am; 40 mg in pm  270 tablet  3  . lovastatin (MEVACOR) 40 MG tablet Take 1 tablet (40 mg total) by mouth at bedtime.  90 tablet  3  . Multiple Vitamin (MULTIVITAMIN) capsule Take 1 capsule by mouth daily.      . warfarin (COUMADIN) 1 MG tablet Take as directed by Anticoagulation clinic.  100 tablet  1  . [DISCONTINUED] ALPRAZolam (XANAX) 0.5 MG tablet Take 1 tablet (0.5 mg total) by mouth 3 (three) times daily as needed for anxiety.  90 tablet  3  . [DISCONTINUED] ALPRAZolam (XANAX) 0.5 MG tablet Take 0.5 mg three times a day if needed  90 tablet  0     Past Medical History  Diagnosis Date  . Coronary artery disease   . Cardiomyopathy, ischemic     EF of 30-35%  . Status post CVA   . VT (ventricular tachycardia) aug of 2010    removed old ICD and replaced with  biventricular ICD  . CKD (chronic kidney disease) stage 3, GFR 30-59 ml/min   . Atrial fibrillation     chronic  . Hypertension   . Dyslipidemia   . Peripheral vascular disease   . Thyroid disease     hypo,due to amiodarone therapy,resolved  . Myocardial infarction January 2001     History of anteroseptal myocardial infarction presenting with left bundle-branch block  . Anxiety   . Tobacco abuse     History of 2 pack per day tobacco habituation  . Pacemaker   . CHF (congestive heart failure)     ROS:   All systems reviewed and negative except as noted in the HPI.   Past Surgical History  Procedure Date  . Cholecystectomy   . Femoral bypass     fem-fem bpg, right fem pop bpg  . Icd 07/15/2009    implant Dr. Arabia Nylund  . Coronary artery bypass graft 2001    x 4  . Cardiac catheterization 2001    PTCA  . Cardiac catheterization 2005    patent grafts  . Cataracts 2006    right eye     Family History  Problem Relation Age of Onset  . Heart   disease Mother   . Aneurysm Brother   . Stroke Mother   . Stroke Father      History   Social History  . Marital Status: Married    Spouse Name: N/A    Number of Children: 2  . Years of Education: N/A   Occupational History  . steel manufacturing    Social History Main Topics  . Smoking status: Former Smoker    Quit date: 09/30/2000  . Smokeless tobacco: Not on file  . Alcohol Use:   . Drug Use:   . Sexually Active:    Other Topics Concern  . Not on file   Social History Narrative  . No narrative on file     BP 108/62  Pulse 74  Wt 159 lb (72.122 kg)  Physical Exam:  Well appearing 71-year-old man, NAD HEENT: Unremarkable Neck:  No JVD, no thyromegally Lungs:  Clear with no wheezes, rales, or rhonchi. HEART:  Regular rate rhythm, no murmurs, no rubs, no clicks Abd:  soft, positive bowel sounds, no organomegally, no rebound, no guarding Ext:  2 plus pulses, no edema, no cyanosis, no clubbing Skin:  No  rashes no nodules Neuro:  CN II through XII intact, motor grossly intact   DEVICE  Normal device function.  See PaceArt for details. Device at elective replacement.  Assess/Plan:  

## 2012-11-04 NOTE — Patient Instructions (Signed)
ICD generator change 11/28/12  See instruction sheet

## 2012-11-04 NOTE — Assessment & Plan Note (Signed)
His ventricular rate appears to be well-controlled. He appears to be chronically in atrial fibrillation.

## 2012-11-05 ENCOUNTER — Other Ambulatory Visit: Payer: Self-pay | Admitting: *Deleted

## 2012-11-05 DIAGNOSIS — I509 Heart failure, unspecified: Secondary | ICD-10-CM

## 2012-11-05 DIAGNOSIS — I472 Ventricular tachycardia: Secondary | ICD-10-CM

## 2012-11-10 ENCOUNTER — Encounter: Payer: Self-pay | Admitting: Internal Medicine

## 2012-11-20 ENCOUNTER — Encounter (HOSPITAL_COMMUNITY): Payer: Self-pay | Admitting: Pharmacy Technician

## 2012-11-20 ENCOUNTER — Ambulatory Visit (INDEPENDENT_AMBULATORY_CARE_PROVIDER_SITE_OTHER): Payer: Medicare Other | Admitting: *Deleted

## 2012-11-20 DIAGNOSIS — I472 Ventricular tachycardia: Secondary | ICD-10-CM

## 2012-11-20 DIAGNOSIS — Z45018 Encounter for adjustment and management of other part of cardiac pacemaker: Secondary | ICD-10-CM

## 2012-11-20 LAB — CBC WITH DIFFERENTIAL/PLATELET
Basophils Relative: 0.8 % (ref 0.0–3.0)
Eosinophils Relative: 2.6 % (ref 0.0–5.0)
HCT: 42.3 % (ref 39.0–52.0)
Hemoglobin: 14.2 g/dL (ref 13.0–17.0)
Lymphs Abs: 2.3 10*3/uL (ref 0.7–4.0)
MCV: 94 fl (ref 78.0–100.0)
Monocytes Absolute: 0.6 10*3/uL (ref 0.1–1.0)
Neutro Abs: 4.8 10*3/uL (ref 1.4–7.7)
RBC: 4.49 Mil/uL (ref 4.22–5.81)
WBC: 7.9 10*3/uL (ref 4.5–10.5)

## 2012-11-20 LAB — BASIC METABOLIC PANEL
BUN: 27 mg/dL — ABNORMAL HIGH (ref 6–23)
Chloride: 102 mEq/L (ref 96–112)
Potassium: 3.8 mEq/L (ref 3.5–5.1)
Sodium: 136 mEq/L (ref 135–145)

## 2012-11-21 ENCOUNTER — Other Ambulatory Visit: Payer: Medicare Other

## 2012-11-27 MED ORDER — CEFAZOLIN SODIUM-DEXTROSE 2-3 GM-% IV SOLR
2.0000 g | INTRAVENOUS | Status: DC
  Filled 2012-11-27 (×2): qty 50

## 2012-11-27 MED ORDER — SODIUM CHLORIDE 0.9 % IR SOLN
80.0000 mg | Status: DC
  Filled 2012-11-27: qty 2

## 2012-11-28 ENCOUNTER — Encounter (HOSPITAL_COMMUNITY)
Admission: RE | Disposition: A | Discharge status: Home / Self Care | Payer: Self-pay | Source: Ambulatory Visit | Attending: Internal Medicine

## 2012-11-28 ENCOUNTER — Ambulatory Visit (HOSPITAL_COMMUNITY)
Admission: RE | Admit: 2012-11-28 | Discharge: 2012-11-28 | Disposition: A | Discharge status: Home / Self Care | Payer: Medicare Other | Source: Ambulatory Visit | Attending: Internal Medicine | Admitting: Internal Medicine

## 2012-11-28 ENCOUNTER — Other Ambulatory Visit: Payer: Self-pay

## 2012-11-28 ENCOUNTER — Ambulatory Visit (HOSPITAL_COMMUNITY): Payer: Medicare Other

## 2012-11-28 DIAGNOSIS — I129 Hypertensive chronic kidney disease with stage 1 through stage 4 chronic kidney disease, or unspecified chronic kidney disease: Secondary | ICD-10-CM | POA: Insufficient documentation

## 2012-11-28 DIAGNOSIS — Z7901 Long term (current) use of anticoagulants: Secondary | ICD-10-CM | POA: Insufficient documentation

## 2012-11-28 DIAGNOSIS — I2589 Other forms of chronic ischemic heart disease: Secondary | ICD-10-CM | POA: Insufficient documentation

## 2012-11-28 DIAGNOSIS — Z79899 Other long term (current) drug therapy: Secondary | ICD-10-CM | POA: Insufficient documentation

## 2012-11-28 DIAGNOSIS — Z4502 Encounter for adjustment and management of automatic implantable cardiac defibrillator: Secondary | ICD-10-CM | POA: Insufficient documentation

## 2012-11-28 DIAGNOSIS — I2510 Atherosclerotic heart disease of native coronary artery without angina pectoris: Secondary | ICD-10-CM | POA: Insufficient documentation

## 2012-11-28 DIAGNOSIS — I5022 Chronic systolic (congestive) heart failure: Secondary | ICD-10-CM | POA: Insufficient documentation

## 2012-11-28 DIAGNOSIS — Z87891 Personal history of nicotine dependence: Secondary | ICD-10-CM | POA: Insufficient documentation

## 2012-11-28 DIAGNOSIS — I472 Ventricular tachycardia, unspecified: Secondary | ICD-10-CM | POA: Insufficient documentation

## 2012-11-28 DIAGNOSIS — I4729 Other ventricular tachycardia: Secondary | ICD-10-CM | POA: Insufficient documentation

## 2012-11-28 DIAGNOSIS — I4891 Unspecified atrial fibrillation: Secondary | ICD-10-CM | POA: Insufficient documentation

## 2012-11-28 DIAGNOSIS — N183 Chronic kidney disease, stage 3 unspecified: Secondary | ICD-10-CM | POA: Insufficient documentation

## 2012-11-28 DIAGNOSIS — I252 Old myocardial infarction: Secondary | ICD-10-CM | POA: Insufficient documentation

## 2012-11-28 DIAGNOSIS — I442 Atrioventricular block, complete: Secondary | ICD-10-CM | POA: Insufficient documentation

## 2012-11-28 DIAGNOSIS — I509 Heart failure, unspecified: Secondary | ICD-10-CM | POA: Insufficient documentation

## 2012-11-28 DIAGNOSIS — E785 Hyperlipidemia, unspecified: Secondary | ICD-10-CM | POA: Insufficient documentation

## 2012-11-28 HISTORY — PX: IMPLANTABLE CARDIOVERTER DEFIBRILLATOR (ICD) GENERATOR CHANGE: SHX5469

## 2012-11-28 LAB — PROTIME-INR
INR: 1.98 — ABNORMAL HIGH (ref 0.00–1.49)
Prothrombin Time: 21.7 seconds — ABNORMAL HIGH (ref 11.6–15.2)

## 2012-11-28 LAB — SURGICAL PCR SCREEN: MRSA, PCR: NEGATIVE

## 2012-11-28 SURGERY — ICD GENERATOR CHANGE
Anesthesia: LOCAL

## 2012-11-28 MED ORDER — CHLORHEXIDINE GLUCONATE 4 % EX LIQD
60.0000 mL | Freq: Once | CUTANEOUS | Status: DC
  Filled 2012-11-28: qty 60

## 2012-11-28 MED ORDER — SODIUM CHLORIDE 0.45 % IV SOLN
INTRAVENOUS | Status: DC
  Administered 2012-11-28: 09:00:00 via INTRAVENOUS

## 2012-11-28 MED ORDER — ACETAMINOPHEN 325 MG PO TABS: 325.0000 mg | ORAL_TABLET | ORAL | Status: DC | PRN

## 2012-11-28 MED ORDER — SODIUM CHLORIDE 0.9 % IJ SOLN: 3.0000 mL | Freq: Two times a day (BID) | INTRAMUSCULAR | Status: DC

## 2012-11-28 MED ORDER — MIDAZOLAM HCL 5 MG/5ML IJ SOLN
INTRAMUSCULAR | Status: AC
  Filled 2012-11-28: qty 5

## 2012-11-28 MED ORDER — LIDOCAINE HCL (PF) 1 % IJ SOLN
INTRAMUSCULAR | Status: AC
  Filled 2012-11-28: qty 60

## 2012-11-28 MED ORDER — SODIUM CHLORIDE 0.9 % IJ SOLN: 3.0000 mL | INTRAMUSCULAR | Status: DC | PRN

## 2012-11-28 MED ORDER — ONDANSETRON HCL 4 MG/2ML IJ SOLN: 4.0000 mg | Freq: Four times a day (QID) | INTRAMUSCULAR | Status: DC | PRN

## 2012-11-28 MED ORDER — SODIUM CHLORIDE 0.9 % IV SOLN: 250.0000 mL | INTRAVENOUS | Status: DC

## 2012-11-28 MED ORDER — MUPIROCIN 2 % EX OINT
TOPICAL_OINTMENT | Freq: Two times a day (BID) | CUTANEOUS | Status: DC
  Administered 2012-11-28: 1 via NASAL
  Filled 2012-11-28 (×2): qty 22

## 2012-11-28 MED ORDER — FENTANYL CITRATE 0.05 MG/ML IJ SOLN
INTRAMUSCULAR | Status: AC
  Filled 2012-11-28: qty 2

## 2012-11-28 NOTE — H&P (View-Only) (Signed)
HPI [name] [name] returns today for followup. He is a 71 year old man with a history of an ischemic cardiomyopathy, chronic systolic heart failure, left bundle branch block, and ventricular tachycardia. He is status post biventricular ICD implantation. The patient denies chest pain. His shortness of breath remains class II. No syncope and no recent ICD shocks. Allergies  Allergen Reactions  . Nitroglycerin   . Other     Intolerance to ACE inhibitors     Current Outpatient Prescriptions  Medication Sig Dispense Refill  . allopurinol (ZYLOPRIM) 100 MG tablet Take 1 tablet (100 mg total) by mouth daily.  90 tablet  3  . ALPRAZolam (XANAX) 0.5 MG tablet Take 1 tablet (0.5 mg total) by mouth at bedtime as needed for sleep.  90 tablet  3  . calcitRIOL (ROCALTROL) 0.25 MCG capsule Take 1 tablet by mouth Daily.      . carvedilol (COREG) 25 MG tablet Take 1 tablet (25 mg total) by mouth 2 (two) times daily.  180 tablet  1  . Cyanocobalamin (VITAMIN B 12 PO) Take by mouth daily.        . digoxin (LANOXIN) 0.125 MG tablet Take 1 tablet (125 mcg total) by mouth daily.  90 tablet  3  . furosemide (LASIX) 40 MG tablet Take 80 mg in am; 40 mg in pm  270 tablet  3  . lovastatin (MEVACOR) 40 MG tablet Take 1 tablet (40 mg total) by mouth at bedtime.  90 tablet  3  . Multiple Vitamin (MULTIVITAMIN) capsule Take 1 capsule by mouth daily.      Marland Kitchen warfarin (COUMADIN) 1 MG tablet Take as directed by Anticoagulation clinic.  100 tablet  1  . [DISCONTINUED] ALPRAZolam (XANAX) 0.5 MG tablet Take 1 tablet (0.5 mg total) by mouth 3 (three) times daily as needed for anxiety.  90 tablet  3  . [DISCONTINUED] ALPRAZolam (XANAX) 0.5 MG tablet Take 0.5 mg three times a day if needed  90 tablet  0     Past Medical History  Diagnosis Date  . Coronary artery disease   . Cardiomyopathy, ischemic     EF of 30-35%  . Status post CVA   . VT (ventricular tachycardia) aug of 2010    removed old ICD and replaced with  biventricular ICD  . CKD (chronic kidney disease) stage 3, GFR 30-59 ml/min   . Atrial fibrillation     chronic  . Hypertension   . Dyslipidemia   . Peripheral vascular disease   . Thyroid disease     hypo,due to amiodarone therapy,resolved  . Myocardial infarction January 2001     History of anteroseptal myocardial infarction presenting with left bundle-branch block  . Anxiety   . Tobacco abuse     History of 2 pack per day tobacco habituation  . Pacemaker   . CHF (congestive heart failure)     ROS:   All systems reviewed and negative except as noted in the HPI.   Past Surgical History  Procedure Date  . Cholecystectomy   . Femoral bypass     fem-fem bpg, right fem pop bpg  . Icd 07/15/2009    implant Dr. Ladona Ridgel  . Coronary artery bypass graft 2001    x 4  . Cardiac catheterization 2001    PTCA  . Cardiac catheterization 2005    patent grafts  . Cataracts 2006    right eye     Family History  Problem Relation Age of Onset  . Heart  disease Mother   . Aneurysm Brother   . Stroke Mother   . Stroke Father      History   Social History  . Marital Status: Married    Spouse Name: N/A    Number of Children: 2  . Years of Education: N/A   Occupational History  . Psychologist, prison and probation services    Social History Main Topics  . Smoking status: Former Smoker    Quit date: 09/30/2000  . Smokeless tobacco: Not on file  . Alcohol Use:   . Drug Use:   . Sexually Active:    Other Topics Concern  . Not on file   Social History Narrative  . No narrative on file     BP 108/62  Pulse 74  Wt 159 lb (72.122 kg)  Physical Exam:  Well appearing 71 year old man, NAD HEENT: Unremarkable Neck:  No JVD, no thyromegally Lungs:  Clear with no wheezes, rales, or rhonchi. HEART:  Regular rate rhythm, no murmurs, no rubs, no clicks Abd:  soft, positive bowel sounds, no organomegally, no rebound, no guarding Ext:  2 plus pulses, no edema, no cyanosis, no clubbing Skin:  No  rashes no nodules Neuro:  CN II through XII intact, motor grossly intact   DEVICE  Normal device function.  See PaceArt for details. Device at elective replacement.  Assess/Plan:

## 2012-11-28 NOTE — Interval H&P Note (Signed)
History and Physical Interval Note:  11/28/2012 10:17 AM  [name] [name]  has presented today for surgery, with the diagnosis of End of life  The various methods of treatment have been discussed with the patient and family. After consideration of risks, benefits and other options for treatment, the patient has consented to  Procedure(s) (LRB) with comments: ICD GENERATOR CHANGE (N/A) as a surgical intervention .  The patient's history has been reviewed, patient examined, no change in status, stable for surgery.  I have reviewed the patient's chart and labs.  Questions were answered to the patient's satisfaction.     Leonia Reeves.D.

## 2012-11-28 NOTE — Op Note (Signed)
BiV ICD removal and insertion of a new device with ICD testing without immediate complication. [id number].

## 2012-11-29 NOTE — Op Note (Signed)
[name] [name], [name] [name] NO.:  [id number]  MEDICAL RECORD NO.:  [id number]  LOCATION:  [facility]                         FACILITY:  [facility]  PHYSICIAN:  Doylene Canning. Ladona Ridgel, MD    DATE OF BIRTH:  Nov 30, 1941  DATE OF PROCEDURE:  11/28/2012 DATE OF DISCHARGE:  11/28/2012                              OPERATIVE REPORT   PROCEDURE PERFORMED:  Removal of previously implanted biventricular ICD and insertion of a new biventricular ICD with ICD testing.  INTRODUCTION:  The patient is a very pleasant 71 year old male with a longstanding ischemic cardiomyopathy, complete heart block, chronic systolic heart failure and ventricular tachycardia, ejection fraction of 25%.  He has reached elective replacement on his ICD and now presents for insertion of a new device and removal of the old device with defibrillation threshold testing.  After the patient was initially sedated with fentanyl and Versed, 30 mL of lidocaine was infiltrated into the left infraclavicular region.  A 7- cm incision was carried out over this region.  Electrocautery was utilized to dissect down to the fascial plane.  The ICD pocket was entered with electrocautery.  The generator was removed with gentle traction.  The leads were freed up from the dense fibrous adhesions with electrocautery.  The pocket was irrigated.  The old device which was a Medtronic Concerto BiV ICD was removed and the leads were evaluated and found to be working satisfactorily.  He had underlying complete heart block.  The new device which was a Medtronic Viva XT, BiV ICD, serial number [id number] was connected to the atrial RV and LV leads and placed back in the subcutaneous pocket.  The pocket was irrigated with antibiotic irrigation and the incision was closed with 2-0 and 3-0 Vicryl.  At this point, I scrubbed out of the case to supervise defibrillation threshold testing.  After the patient was more deeply sedated with additional  intravenous fentanyl and Versed under my direct supervision, VF was induced with a T- wave shock.  A 20-joule shock was subsequently delivered, which terminated ventricular fibrillation and restored sinus rhythm.  At this point, a pressure dressing was placed over the incision and the patient was awakened and allowed to return to recovery area in satisfactory condition.  COMPLICATIONS:  There were no immediate procedure complications.  RESULTS:  This demonstrate successful removal of a previously implanted BiV ICD which reached elective replacement and insertion of a new BiV ICD with defibrillation threshold testing.    Doylene Canning. Ladona Ridgel, MD    GWT/MEDQ  D:  11/28/2012  T:  11/29/2012  Job:  [id number]  cc:   Peter M. [name], M.D.

## 2012-12-04 ENCOUNTER — Ambulatory Visit (INDEPENDENT_AMBULATORY_CARE_PROVIDER_SITE_OTHER): Payer: Medicare Other

## 2012-12-04 DIAGNOSIS — I4891 Unspecified atrial fibrillation: Secondary | ICD-10-CM

## 2012-12-04 DIAGNOSIS — Z7901 Long term (current) use of anticoagulants: Secondary | ICD-10-CM

## 2012-12-04 DIAGNOSIS — I6350 Cerebral infarction due to unspecified occlusion or stenosis of unspecified cerebral artery: Secondary | ICD-10-CM

## 2012-12-11 ENCOUNTER — Ambulatory Visit (INDEPENDENT_AMBULATORY_CARE_PROVIDER_SITE_OTHER): Payer: Medicare Other | Admitting: Internal Medicine

## 2012-12-11 ENCOUNTER — Encounter: Payer: Self-pay | Admitting: Internal Medicine

## 2012-12-11 ENCOUNTER — Ambulatory Visit (INDEPENDENT_AMBULATORY_CARE_PROVIDER_SITE_OTHER): Payer: Medicare Other | Admitting: Cardiology

## 2012-12-11 VITALS — BP 100/70 | HR 76 | Wt 157.0 lb

## 2012-12-11 DIAGNOSIS — I2589 Other forms of chronic ischemic heart disease: Secondary | ICD-10-CM

## 2012-12-11 DIAGNOSIS — I5022 Chronic systolic (congestive) heart failure: Secondary | ICD-10-CM

## 2012-12-11 DIAGNOSIS — Z95810 Presence of automatic (implantable) cardiac defibrillator: Secondary | ICD-10-CM

## 2012-12-11 DIAGNOSIS — R0989 Other specified symptoms and signs involving the circulatory and respiratory systems: Secondary | ICD-10-CM

## 2012-12-11 LAB — ICD DEVICE OBSERVATION
AL AMPLITUDE: 0.4 mv
AL IMPEDENCE ICD: 456 Ohm
LV LEAD IMPEDENCE ICD: 722 Ohm
RV LEAD AMPLITUDE: 14.3 mv
RV LEAD IMPEDENCE ICD: 703 Ohm

## 2012-12-11 NOTE — Progress Notes (Signed)
Wound check in clinic s/p BiV ICD generator change 11/28/2012. Left upper chest/implant site intact and well healed. No erythema, warmth, tenderness or drainage. Device interrogation shows normal BiV ICD function with stable measurements. Dr. Ladona Ridgel was in to assess the patient as well. Patient will return to clinic for 3 month follow-up on 03/03/2013 at 4:00PM.

## 2012-12-11 NOTE — Patient Instructions (Signed)
Your physician recommends that you schedule a follow-up appointment in: March with Dr Taylor  

## 2012-12-30 ENCOUNTER — Ambulatory Visit (INDEPENDENT_AMBULATORY_CARE_PROVIDER_SITE_OTHER): Payer: Medicare Other | Admitting: *Deleted

## 2012-12-30 DIAGNOSIS — I4891 Unspecified atrial fibrillation: Secondary | ICD-10-CM

## 2012-12-30 DIAGNOSIS — I6350 Cerebral infarction due to unspecified occlusion or stenosis of unspecified cerebral artery: Secondary | ICD-10-CM

## 2012-12-30 DIAGNOSIS — Z7901 Long term (current) use of anticoagulants: Secondary | ICD-10-CM

## 2012-12-30 LAB — POCT INR: INR: 3.5

## 2013-01-05 ENCOUNTER — Other Ambulatory Visit: Payer: Self-pay

## 2013-01-05 DIAGNOSIS — E785 Hyperlipidemia, unspecified: Secondary | ICD-10-CM

## 2013-01-05 MED ORDER — LOVASTATIN 40 MG PO TABS: 40.0000 mg | ORAL_TABLET | Freq: Every day | ORAL | Status: DC

## 2013-01-05 MED ORDER — ALLOPURINOL 100 MG PO TABS: 100.0000 mg | ORAL_TABLET | Freq: Every day | ORAL | Status: DC

## 2013-01-21 ENCOUNTER — Other Ambulatory Visit: Payer: Self-pay

## 2013-01-21 DIAGNOSIS — F419 Anxiety disorder, unspecified: Secondary | ICD-10-CM

## 2013-01-24 ENCOUNTER — Emergency Department (HOSPITAL_COMMUNITY): Payer: Medicare Other

## 2013-01-24 ENCOUNTER — Encounter (HOSPITAL_COMMUNITY): Payer: Self-pay | Admitting: *Deleted

## 2013-01-24 ENCOUNTER — Emergency Department (HOSPITAL_COMMUNITY)
Admission: EM | Admit: 2013-01-24 | Discharge: 2013-01-24 | Disposition: A | Discharge status: Home / Self Care | Payer: Medicare Other | Attending: Emergency Medicine | Admitting: Emergency Medicine

## 2013-01-24 DIAGNOSIS — Z8673 Personal history of transient ischemic attack (TIA), and cerebral infarction without residual deficits: Secondary | ICD-10-CM | POA: Insufficient documentation

## 2013-01-24 DIAGNOSIS — F411 Generalized anxiety disorder: Secondary | ICD-10-CM | POA: Insufficient documentation

## 2013-01-24 DIAGNOSIS — Z7901 Long term (current) use of anticoagulants: Secondary | ICD-10-CM | POA: Insufficient documentation

## 2013-01-24 DIAGNOSIS — Z87891 Personal history of nicotine dependence: Secondary | ICD-10-CM | POA: Insufficient documentation

## 2013-01-24 DIAGNOSIS — Z79899 Other long term (current) drug therapy: Secondary | ICD-10-CM | POA: Insufficient documentation

## 2013-01-24 DIAGNOSIS — I4891 Unspecified atrial fibrillation: Secondary | ICD-10-CM | POA: Insufficient documentation

## 2013-01-24 DIAGNOSIS — R609 Edema, unspecified: Secondary | ICD-10-CM | POA: Insufficient documentation

## 2013-01-24 DIAGNOSIS — Z951 Presence of aortocoronary bypass graft: Secondary | ICD-10-CM | POA: Insufficient documentation

## 2013-01-24 DIAGNOSIS — I509 Heart failure, unspecified: Secondary | ICD-10-CM | POA: Insufficient documentation

## 2013-01-24 DIAGNOSIS — Z95810 Presence of automatic (implantable) cardiac defibrillator: Secondary | ICD-10-CM | POA: Insufficient documentation

## 2013-01-24 DIAGNOSIS — Z862 Personal history of diseases of the blood and blood-forming organs and certain disorders involving the immune mechanism: Secondary | ICD-10-CM | POA: Insufficient documentation

## 2013-01-24 DIAGNOSIS — I2510 Atherosclerotic heart disease of native coronary artery without angina pectoris: Secondary | ICD-10-CM | POA: Insufficient documentation

## 2013-01-24 DIAGNOSIS — J36 Peritonsillar abscess: Secondary | ICD-10-CM | POA: Insufficient documentation

## 2013-01-24 DIAGNOSIS — E785 Hyperlipidemia, unspecified: Secondary | ICD-10-CM | POA: Insufficient documentation

## 2013-01-24 DIAGNOSIS — R05 Cough: Secondary | ICD-10-CM | POA: Insufficient documentation

## 2013-01-24 DIAGNOSIS — R0602 Shortness of breath: Secondary | ICD-10-CM | POA: Insufficient documentation

## 2013-01-24 DIAGNOSIS — I252 Old myocardial infarction: Secondary | ICD-10-CM | POA: Insufficient documentation

## 2013-01-24 DIAGNOSIS — Z8679 Personal history of other diseases of the circulatory system: Secondary | ICD-10-CM | POA: Insufficient documentation

## 2013-01-24 DIAGNOSIS — R059 Cough, unspecified: Secondary | ICD-10-CM | POA: Insufficient documentation

## 2013-01-24 DIAGNOSIS — N183 Chronic kidney disease, stage 3 unspecified: Secondary | ICD-10-CM | POA: Insufficient documentation

## 2013-01-24 DIAGNOSIS — I129 Hypertensive chronic kidney disease with stage 1 through stage 4 chronic kidney disease, or unspecified chronic kidney disease: Secondary | ICD-10-CM | POA: Insufficient documentation

## 2013-01-24 DIAGNOSIS — I739 Peripheral vascular disease, unspecified: Secondary | ICD-10-CM | POA: Insufficient documentation

## 2013-01-24 DIAGNOSIS — Z950 Presence of cardiac pacemaker: Secondary | ICD-10-CM | POA: Insufficient documentation

## 2013-01-24 DIAGNOSIS — Z8639 Personal history of other endocrine, nutritional and metabolic disease: Secondary | ICD-10-CM | POA: Insufficient documentation

## 2013-01-24 LAB — PROTIME-INR
INR: 2.9 — ABNORMAL HIGH (ref 0.00–1.49)
Prothrombin Time: 28.8 seconds — ABNORMAL HIGH (ref 11.6–15.2)

## 2013-01-24 LAB — CBC WITH DIFFERENTIAL/PLATELET
Basophils Absolute: 0 10*3/uL (ref 0.0–0.1)
Basophils Relative: 0 % (ref 0–1)
Eosinophils Absolute: 0.1 10*3/uL (ref 0.0–0.7)
Hemoglobin: 13.5 g/dL (ref 13.0–17.0)
MCHC: 34 g/dL (ref 30.0–36.0)
Monocytes Relative: 13 % — ABNORMAL HIGH (ref 3–12)
Neutro Abs: 8.1 10*3/uL — ABNORMAL HIGH (ref 1.7–7.7)
Neutrophils Relative %: 69 % (ref 43–77)
Platelets: 173 10*3/uL (ref 150–400)

## 2013-01-24 LAB — POCT I-STAT, CHEM 8
BUN: 18 mg/dL (ref 6–23)
Calcium, Ion: 1.21 mmol/L (ref 1.13–1.30)
HCT: 42 % (ref 39.0–52.0)
Hemoglobin: 14.3 g/dL (ref 13.0–17.0)
Sodium: 139 mEq/L (ref 135–145)
TCO2: 27 mmol/L (ref 0–100)

## 2013-01-24 MED ORDER — SODIUM CHLORIDE 0.9 % IV SOLN
INTRAVENOUS | Status: DC
  Administered 2013-01-24: 14:00:00 via INTRAVENOUS

## 2013-01-24 MED ORDER — CLINDAMYCIN HCL 150 MG PO CAPS: 300.0000 mg | ORAL_CAPSULE | Freq: Three times a day (TID) | ORAL | Status: DC

## 2013-01-24 MED ORDER — CLINDAMYCIN PHOSPHATE 600 MG/50ML IV SOLN
600.0000 mg | Freq: Once | INTRAVENOUS | Status: AC
  Administered 2013-01-24: 600 mg via INTRAVENOUS
  Filled 2013-01-24: qty 50

## 2013-01-24 MED ORDER — HYDROCODONE-ACETAMINOPHEN 7.5-325 MG/15ML PO SOLN: 15.0000 mL | Freq: Four times a day (QID) | ORAL | Status: DC | PRN

## 2013-01-24 NOTE — Consult Note (Signed)
ENT CONSULT:  Reason for Consult: Sore Throat Referring Physician: EDP  [name] [name] is an 72 y.o. male.  HPI: 3d hx of progressive ST and dif swallowing. No prior hx of tonsillitis or infection. Pt c/o increasing ST, odynophagia and Lt ear pain.   Past Medical History  Diagnosis Date  . Coronary artery disease   . Cardiomyopathy, ischemic     EF of 30-35%  . Status post CVA   . VT (ventricular tachycardia) aug of 2010    removed old ICD and replaced with biventricular ICD  . CKD (chronic kidney disease) stage 3, GFR 30-59 ml/min   . Atrial fibrillation     chronic  . Hypertension   . Dyslipidemia   . Peripheral vascular disease   . Thyroid disease     hypo,due to amiodarone therapy,resolved  . Myocardial infarction January 2001     History of anteroseptal myocardial infarction presenting with left bundle-branch block  . Anxiety   . Tobacco abuse     History of 2 pack per day tobacco habituation  . Pacemaker   . CHF (congestive heart failure)     Past Surgical History  Procedure Laterality Date  . Cholecystectomy    . Femoral bypass      fem-fem bpg, right fem pop bpg  . Icd  07/15/2009    implant Dr. Ladona Ridgel  . Coronary artery bypass graft  2001    x 4  . Cardiac catheterization  2001    PTCA  . Cardiac catheterization  2005    patent grafts  . Cataracts  2006    right eye    Family History  Problem Relation Age of Onset  . Heart disease Mother   . Aneurysm Brother   . Stroke Mother   . Stroke Father     Social History:  reports that he quit smoking about 12 years ago. He does not have any smokeless tobacco history on file. His alcohol and drug histories are not on file.  Allergies:  Allergies  Allergen Reactions  . Nitroglycerin   . Other     Intolerance to ACE inhibitors    Medications: I have reviewed the patient's current medications.  Results for orders placed during the hospital encounter of 01/24/13 (from the past 48 hour(s))  CBC WITH  DIFFERENTIAL     Status: Abnormal   Collection Time    01/24/13 12:08 PM      Result Value Range   WBC 11.8 (*) 4.0 - 10.5 K/uL   RBC 4.27  4.22 - 5.81 MIL/uL   Hemoglobin 13.5  13.0 - 17.0 g/dL   HCT 11.9  14.7 - 82.9 %   MCV 93.0  78.0 - 100.0 fL   MCH 31.6  26.0 - 34.0 pg   MCHC 34.0  30.0 - 36.0 g/dL   RDW 56.2  13.0 - 86.5 %   Platelets 173  150 - 400 K/uL   Neutrophils Relative 69  43 - 77 %   Neutro Abs 8.1 (*) 1.7 - 7.7 K/uL   Lymphocytes Relative 17  12 - 46 %   Lymphs Abs 2.1  0.7 - 4.0 K/uL   Monocytes Relative 13 (*) 3 - 12 %   Monocytes Absolute 1.6 (*) 0.1 - 1.0 K/uL   Eosinophils Relative 1  0 - 5 %   Eosinophils Absolute 0.1  0.0 - 0.7 K/uL   Basophils Relative 0  0 - 1 %   Basophils Absolute 0.0  0.0 - 0.1 K/uL  PROTIME-INR     Status: Abnormal   Collection Time    01/24/13 12:08 PM      Result Value Range   Prothrombin Time 28.8 (*) 11.6 - 15.2 seconds   INR 2.90 (*) 0.00 - 1.49  POCT I-STAT, CHEM 8     Status: None   Collection Time    01/24/13 12:19 PM      Result Value Range   Sodium 139  135 - 145 mEq/L   Potassium 3.7  3.5 - 5.1 mEq/L   Chloride 103  96 - 112 mEq/L   BUN 18  6 - 23 mg/dL   Creatinine, Ser 1.61  0.50 - 1.35 mg/dL   Glucose, Bld 90  70 - 99 mg/dL   Calcium, Ion 0.96  0.45 - 1.30 mmol/L   TCO2 27  0 - 100 mmol/L   Hemoglobin 14.3  13.0 - 17.0 g/dL   HCT 40.9  81.1 - 91.4 %    No results found.  NWG:[id number], per adm chart   Blood pressure 102/58, pulse 77, temperature 97.4 F (36.3 C), temperature source Oral, resp. rate 18, SpO2 91.00%.  PHYSICAL EXAM: General appearance - alert, well appearing, and in no distress Ears - bilateral TM's and external ear canals normal Nose - normal and patent, no erythema, discharge or polyps Mouth - mucous membranes moist, pharynx normal without lesions, tonsils hypertrophied with exudate and Lt peritonsillar abscess Neck - supple, no significant adenopathy  Studies  Reviewed:None  Procedure: Aspiration Lt PTA  Opted for needle asp 2nd to coumadin use  Topical Anesth  22g needle asp for 7 cc of pus  No complications or bleeding   Assessment/Plan: Pt tolerated Aspiration, no bleeding or complication. Rec tonsil precautions, po cleocin 300mg  tid for 10 days, prn Lortab elixir. Monitor for recurrent sx and f/u in 2 wks for recheck.  Skylah Delauter 01/24/2013, 1:12 PM

## 2013-01-24 NOTE — ED Notes (Signed)
ENT at bedside

## 2013-01-24 NOTE — ED Provider Notes (Signed)
History     CSN: [id number]  Arrival date & time 01/24/13  1114   First MD Initiated Contact with Patient 01/24/13 1150      Chief Complaint  Patient presents with  . Sore Throat    (Consider location/radiation/quality/duration/timing/severity/associated sxs/prior treatment) Patient is a 72 y.o. male presenting with pharyngitis.  Sore Throat Associated symptoms include shortness of breath. Pertinent negatives include no chest pain and no headaches.   patient presents with sore throat for the last few days. He has had left-sided sore throat has had difficulty swallowing. He has pain with swallowing. He said mild difficulty breathing, however he always has difficulty breathing. He states the breathing is no different than normal. He has had a little bit of cough 2. There's been no production.  no fevers. He is on Coumadin. Pa. st Medical History  Diagnosis Date  . Coronary artery disease   . Cardiomyopathy, ischemic     EF of 30-35%  . Status post CVA   . VT (ventricular tachycardia) aug of 2010    removed old ICD and replaced with biventricular ICD  . CKD (chronic kidney disease) stage 3, GFR 30-59 ml/min   . Atrial fibrillation     chronic  . Hypertension   . Dyslipidemia   . Peripheral vascular disease   . Thyroid disease     hypo,due to amiodarone therapy,resolved  . Myocardial infarction January 2001     History of anteroseptal myocardial infarction presenting with left bundle-branch block  . Anxiety   . Tobacco abuse     History of 2 pack per day tobacco habituation  . Pacemaker   . CHF (congestive heart failure)     Past Surgical History  Procedure Laterality Date  . Cholecystectomy    . Femoral bypass      fem-fem bpg, right fem pop bpg  . Icd  07/15/2009    implant Dr. Ladona Ridgel  . Coronary artery bypass graft  2001    x 4  . Cardiac catheterization  2001    PTCA  . Cardiac catheterization  2005    patent grafts  . Cataracts  2006    right eye    Family  History  Problem Relation Age of Onset  . Heart disease Mother   . Aneurysm Brother   . Stroke Mother   . Stroke Father     History  Substance Use Topics  . Smoking status: Former Smoker    Quit date: 09/30/2000  . Smokeless tobacco: Not on file  . Alcohol Use:       Review of Systems  Constitutional: Negative for fever and fatigue.  HENT: Positive for sore throat and trouble swallowing. Negative for voice change.   Respiratory: Positive for shortness of breath. Negative for chest tightness.   Cardiovascular: Negative for chest pain.  Gastrointestinal: Negative for nausea and blood in stool.  Genitourinary: Negative for dysuria.  Musculoskeletal: Negative for back pain.  Neurological: Negative for dizziness and headaches.    Allergies  Nitroglycerin and Other  Home Medications   Current Outpatient Rx  Name  Route  Sig  Dispense  Refill  . allopurinol (ZYLOPRIM) 100 MG tablet   Oral   Take 1 tablet (100 mg total) by mouth daily.   90 tablet   3   . ALPRAZolam (XANAX) 0.5 MG tablet   Oral   Take 1 tablet (0.5 mg total) by mouth at bedtime as needed for sleep.   90 tablet   3   .  calcitRIOL (ROCALTROL) 0.25 MCG capsule   Oral   Take 0.25 mcg by mouth daily.          . carvedilol (COREG) 25 MG tablet   Oral   Take 1 tablet (25 mg total) by mouth 2 (two) times daily.   180 tablet   1   . digoxin (LANOXIN) 0.125 MG tablet   Oral   Take 1 tablet (125 mcg total) by mouth daily.   90 tablet   3   . furosemide (LASIX) 40 MG tablet      Take 2 tablets in the morning and 1 tablet in the evening         . lovastatin (MEVACOR) 40 MG tablet   Oral   Take 1 tablet (40 mg total) by mouth at bedtime.   90 tablet   3   . Multiple Vitamins-Minerals (PRESERVISION AREDS 2) CAPS   Oral   Take 1 capsule by mouth 2 (two) times daily.         . vitamin B-12 (CYANOCOBALAMIN) 100 MCG tablet   Oral   Take 50 mcg by mouth daily.         Marland Kitchen warfarin  (COUMADIN) 1 MG tablet   Oral   Take 1 mg by mouth daily. Take 1.5 tablet Mon and Fri and 1 tablet the rest of the week         . clindamycin (CLEOCIN) 150 MG capsule   Oral   Take 2 capsules (300 mg total) by mouth 3 (three) times daily.   15 capsule   0   . HYDROcodone-acetaminophen (HYCET) 7.5-325 mg/15 ml solution   Oral   Take 15 mLs by mouth every 6 (six) hours as needed for pain.   150 mL   0     BP 156/70  Pulse 89  Temp(Src) 98.4 F (36.9 C) (Oral)  Resp 15  SpO2 100%  Physical Exam  Constitutional: He appears well-developed.  HENT:  Mild uvular edema. Left tonsil is protruding and swollen. periTonsillar swelling. Uvula is slightly shifted to the right. No stridor. No trismus  Eyes: Pupils are equal, round, and reactive to light.  Neck: Neck supple.  Pulmonary/Chest:  Mildly harsh breath sounds  Abdominal: There is no tenderness.  Musculoskeletal: He exhibits edema.  Neurological: He is alert.  Skin: Skin is warm.    ED Course  Procedures (including critical care time)  Labs Reviewed  CBC WITH DIFFERENTIAL - Abnormal; Notable for the following:    WBC 11.8 (*)    Neutro Abs 8.1 (*)    Monocytes Relative 13 (*)    Monocytes Absolute 1.6 (*)    All other components within normal limits  PROTIME-INR - Abnormal; Notable for the following:    Prothrombin Time 28.8 (*)    INR 2.90 (*)    All other components within normal limits  POCT I-STAT, CHEM 8   Dg Chest 2 View  01/24/2013  *RADIOLOGY REPORT*  Clinical Data: Shortness of breath.  CHEST - 2 VIEW  Comparison: 11/28/2012 and prior chest radiographs  Findings: Cardiomegaly, CABG changes and left-sided pacemaker / AICD again noted. Mild interstitial prominence is unchanged. COPD/emphysema again noted. There is no evidence of focal airspace disease, pulmonary edema, suspicious pulmonary nodule/mass, pleural effusion, or pneumothorax. No acute bony abnormalities are identified.  IMPRESSION: No evidence of  acute cardiopulmonary disease.  Cardiomegaly and COPD/emphysema.   Original Report Authenticated By: Harmon Pier, M.D.      1. Peritonsillar abscess  MDM  Patient with peritonsillar abscess that was drained in the ER by ENT. Patient feels much better after the treatment. He has mild hypoxia but states this is baseline for him. X-ray is reassuring. Patient was discharged home.         Juliet Rude. Rubin Payor, MD 01/24/13 [phone]

## 2013-01-24 NOTE — ED Notes (Signed)
Pt sent here from [facility]. Reports sore throat and pain to left side of face/ear for several days. Pt is able to swallow but having pain. spo2 91%, large swelling noted to tonsils and airway. Sent here due to possible airway obstruction due to swelling

## 2013-01-30 ENCOUNTER — Ambulatory Visit (INDEPENDENT_AMBULATORY_CARE_PROVIDER_SITE_OTHER): Payer: Medicare Other | Admitting: *Deleted

## 2013-01-30 DIAGNOSIS — I4891 Unspecified atrial fibrillation: Secondary | ICD-10-CM

## 2013-01-30 LAB — POCT INR: INR: 3.3

## 2013-02-12 ENCOUNTER — Other Ambulatory Visit: Payer: Self-pay

## 2013-02-12 MED ORDER — CARVEDILOL 25 MG PO TABS: 25.0000 mg | ORAL_TABLET | Freq: Two times a day (BID) | ORAL | Status: DC

## 2013-02-20 ENCOUNTER — Ambulatory Visit (INDEPENDENT_AMBULATORY_CARE_PROVIDER_SITE_OTHER): Payer: Medicare Other | Admitting: *Deleted

## 2013-02-20 DIAGNOSIS — I4891 Unspecified atrial fibrillation: Secondary | ICD-10-CM

## 2013-02-20 DIAGNOSIS — Z7901 Long term (current) use of anticoagulants: Secondary | ICD-10-CM

## 2013-02-20 DIAGNOSIS — I6350 Cerebral infarction due to unspecified occlusion or stenosis of unspecified cerebral artery: Secondary | ICD-10-CM

## 2013-02-20 LAB — POCT INR: INR: 2.6

## 2013-03-03 ENCOUNTER — Ambulatory Visit (INDEPENDENT_AMBULATORY_CARE_PROVIDER_SITE_OTHER): Payer: Medicare Other | Admitting: Internal Medicine

## 2013-03-03 ENCOUNTER — Encounter: Payer: Self-pay | Admitting: Internal Medicine

## 2013-03-03 VITALS — BP 105/68 | HR 81 | Ht 68.0 in | Wt 155.0 lb

## 2013-03-03 DIAGNOSIS — I4891 Unspecified atrial fibrillation: Secondary | ICD-10-CM

## 2013-03-03 DIAGNOSIS — I472 Ventricular tachycardia, unspecified: Secondary | ICD-10-CM

## 2013-03-03 DIAGNOSIS — I5022 Chronic systolic (congestive) heart failure: Secondary | ICD-10-CM

## 2013-03-03 DIAGNOSIS — Z95810 Presence of automatic (implantable) cardiac defibrillator: Secondary | ICD-10-CM

## 2013-03-03 DIAGNOSIS — I2589 Other forms of chronic ischemic heart disease: Secondary | ICD-10-CM

## 2013-03-03 DIAGNOSIS — I255 Ischemic cardiomyopathy: Secondary | ICD-10-CM

## 2013-03-03 LAB — ICD DEVICE OBSERVATION
AL AMPLITUDE: 1.4 mv
AL IMPEDENCE ICD: 437 Ohm
LV LEAD IMPEDENCE ICD: 703 Ohm
RV LEAD IMPEDENCE ICD: 665 Ohm
RV LEAD THRESHOLD: 1.25 V

## 2013-03-03 NOTE — Patient Instructions (Addendum)
Your physician wants you to follow-up in: 11/2013 with Dr Court Joy will receive a reminder letter in the mail two months in advance. If you don't receive a letter, please call our office to schedule the follow-up appointment.    Remote monitoring is used to monitor your Pacemaker of ICD from home. This monitoring reduces the number of office visits required to check your device to one time per year. It allows [place] to keep an eye on the functioning of your device to ensure it is working properly. You are scheduled for a device check from home on 06/08/13. You may send your transmission at any time that day. If you have a wireless device, the transmission will be sent automatically. After your physician reviews your transmission, you will receive a postcard with your next transmission date.

## 2013-03-03 NOTE — Assessment & Plan Note (Signed)
His chronic systolic heart failure is class IIA. I've encouraged him to continue his medical therapy, maintain a low-sodium diet, and increase his physical activity.

## 2013-03-03 NOTE — Progress Notes (Signed)
HPI [name] returns today for followup. He is a very pleasant 72 year old man with an ischemic cardiomyopathy, chronic atrial fibrillation, complete heart block, ventricular tachycardia, status post ICD insertion. He underwent generator removal and insertion of a new device back in December. Since then he has been stable. He states today that he feels better than he has in many years. He denies chest pain or shortness of breath. No syncope. No ICD shock. No peripheral edema. Allergies  Allergen Reactions  . Nitroglycerin Other (See Comments)    Became severely hypotensive  . Other     Intolerance to ACE inhibitors     Current Outpatient Prescriptions  Medication Sig Dispense Refill  . allopurinol (ZYLOPRIM) 100 MG tablet Take 1 tablet (100 mg total) by mouth daily.  90 tablet  3  . ALPRAZolam (XANAX) 0.5 MG tablet Take 1 tablet (0.5 mg total) by mouth at bedtime as needed for sleep.  90 tablet  3  . calcitRIOL (ROCALTROL) 0.25 MCG capsule Take 0.25 mcg by mouth daily.       . carvedilol (COREG) 25 MG tablet Take 1 tablet (25 mg total) by mouth 2 (two) times daily.  180 tablet  0  . clindamycin (CLEOCIN) 150 MG capsule Take 2 capsules (300 mg total) by mouth 3 (three) times daily.  15 capsule  0  . digoxin (LANOXIN) 0.125 MG tablet Take 1 tablet (125 mcg total) by mouth daily.  90 tablet  3  . furosemide (LASIX) 40 MG tablet Take 2 tablets in the morning and 1 tablet in the evening      . HYDROcodone-acetaminophen (HYCET) 7.5-325 mg/15 ml solution Take 15 mLs by mouth every 6 (six) hours as needed for pain.  150 mL  0  . lovastatin (MEVACOR) 40 MG tablet Take 1 tablet (40 mg total) by mouth at bedtime.  90 tablet  3  . Multiple Vitamins-Minerals (PRESERVISION AREDS 2) CAPS Take 1 capsule by mouth 2 (two) times daily.      . vitamin B-12 (CYANOCOBALAMIN) 100 MCG tablet Take 50 mcg by mouth daily.      Marland Kitchen warfarin (COUMADIN) 1 MG tablet Take 1 mg by mouth daily. Take 1.5 tablet Mon and Fri  and 1 tablet the rest of the week       No current facility-administered medications for this visit.     Past Medical History  Diagnosis Date  . Coronary artery disease   . Cardiomyopathy, ischemic     EF of 30-35%  . Status post CVA   . VT (ventricular tachycardia) aug of 2010    removed old ICD and replaced with biventricular ICD  . CKD (chronic kidney disease) stage 3, GFR 30-59 ml/min   . Atrial fibrillation     chronic  . Hypertension   . Dyslipidemia   . Peripheral vascular disease   . Thyroid disease     hypo,due to amiodarone therapy,resolved  . Myocardial infarction January 2001     History of anteroseptal myocardial infarction presenting with left bundle-branch block  . Anxiety   . Tobacco abuse     History of 2 pack per day tobacco habituation  . Pacemaker   . CHF (congestive heart failure)     ROS:   All systems reviewed and negative except as noted in the HPI.   Past Surgical History  Procedure Laterality Date  . Cholecystectomy    . Femoral bypass      fem-fem bpg, right fem pop bpg  .  Icd  07/15/2009    implant Dr. Ladona Ridgel  . Coronary artery bypass graft  2001    x 4  . Cardiac catheterization  2001    PTCA  . Cardiac catheterization  2005    patent grafts  . Cataracts  2006    right eye     Family History  Problem Relation Age of Onset  . Heart disease Mother   . Aneurysm Brother   . Stroke Mother   . Stroke Father      History   Social History  . Marital Status: Married    Spouse Name: N/A    Number of Children: 2  . Years of Education: N/A   Occupational History  . Psychologist, prison and probation services    Social History Main Topics  . Smoking status: Former Smoker    Quit date: 09/30/2000  . Smokeless tobacco: Not on file  . Alcohol Use:   . Drug Use:   . Sexually Active:    Other Topics Concern  . Not on file   Social History Narrative  . No narrative on file     BP 105/68  Pulse 81  Ht 5\' 8"  (1.727 m)  Wt 155 lb (70.308 kg)   BMI 23.57 kg/m2  Physical Exam:  Well appearing 72 year old man,NAD HEENT: Unremarkable Neck:  No JVD, no thyromegally Lungs:  Clear with no wheezes, rales, or rhonchi. Well-healed ICD incision. HEART:  Regular rate rhythm, no murmurs, no rubs, no clicks Abd:  soft, positive bowel sounds, no organomegally, no rebound, no guarding Ext:  2 plus pulses, no edema, no cyanosis, no clubbing Skin:  No rashes no nodules Neuro:  CN II through XII intact, motor grossly intact   DEVICE  Normal device function.  See PaceArt for details.   Assess/Plan:

## 2013-03-03 NOTE — Assessment & Plan Note (Signed)
His ventricular rate is well controlled. He'll continue his current medical therapy. 

## 2013-03-03 NOTE — Assessment & Plan Note (Signed)
His Medtronic biventricular ICD is working normally. We'll plan to recheck in several months. 

## 2013-03-06 ENCOUNTER — Other Ambulatory Visit: Payer: Self-pay

## 2013-03-06 DIAGNOSIS — F419 Anxiety disorder, unspecified: Secondary | ICD-10-CM

## 2013-03-06 MED ORDER — ALPRAZOLAM 0.5 MG PO TABS: 0.5000 mg | ORAL_TABLET | Freq: Every evening | ORAL | Status: DC | PRN

## 2013-03-24 ENCOUNTER — Ambulatory Visit (INDEPENDENT_AMBULATORY_CARE_PROVIDER_SITE_OTHER): Payer: Medicare Other

## 2013-03-24 DIAGNOSIS — Z7901 Long term (current) use of anticoagulants: Secondary | ICD-10-CM

## 2013-03-24 DIAGNOSIS — I4891 Unspecified atrial fibrillation: Secondary | ICD-10-CM

## 2013-03-24 DIAGNOSIS — I6350 Cerebral infarction due to unspecified occlusion or stenosis of unspecified cerebral artery: Secondary | ICD-10-CM

## 2013-03-25 ENCOUNTER — Other Ambulatory Visit: Payer: Self-pay | Admitting: *Deleted

## 2013-03-25 DIAGNOSIS — I2510 Atherosclerotic heart disease of native coronary artery without angina pectoris: Secondary | ICD-10-CM

## 2013-03-25 MED ORDER — CARVEDILOL 25 MG PO TABS: 25.0000 mg | ORAL_TABLET | Freq: Two times a day (BID) | ORAL | Status: DC

## 2013-04-01 ENCOUNTER — Other Ambulatory Visit: Payer: Self-pay

## 2013-04-01 ENCOUNTER — Telehealth: Payer: Self-pay | Admitting: Cardiology

## 2013-04-01 DIAGNOSIS — I2510 Atherosclerotic heart disease of native coronary artery without angina pectoris: Secondary | ICD-10-CM

## 2013-04-01 DIAGNOSIS — F419 Anxiety disorder, unspecified: Secondary | ICD-10-CM

## 2013-04-01 MED ORDER — CARVEDILOL 25 MG PO TABS: 25.0000 mg | ORAL_TABLET | Freq: Two times a day (BID) | ORAL | Status: DC

## 2013-04-06 MED ORDER — ALPRAZOLAM 0.5 MG PO TABS: 0.5000 mg | ORAL_TABLET | Freq: Every evening | ORAL | Status: DC | PRN

## 2013-04-06 NOTE — Telephone Encounter (Signed)
New problem   Pt stated he was told to call you to get his refill b/c it wouldn't take long to do it this way Alprazolom 0.[facility]mg . [facility].

## 2013-04-06 NOTE — Telephone Encounter (Signed)
Xanax refilled faxed to [facility] fax # [phone].

## 2013-04-07 ENCOUNTER — Ambulatory Visit (INDEPENDENT_AMBULATORY_CARE_PROVIDER_SITE_OTHER): Payer: Medicare Other | Admitting: *Deleted

## 2013-04-07 DIAGNOSIS — I6350 Cerebral infarction due to unspecified occlusion or stenosis of unspecified cerebral artery: Secondary | ICD-10-CM

## 2013-04-07 DIAGNOSIS — Z7901 Long term (current) use of anticoagulants: Secondary | ICD-10-CM

## 2013-04-07 DIAGNOSIS — I4891 Unspecified atrial fibrillation: Secondary | ICD-10-CM

## 2013-04-10 ENCOUNTER — Telehealth: Payer: Self-pay | Admitting: Cardiology

## 2013-04-10 DIAGNOSIS — F419 Anxiety disorder, unspecified: Secondary | ICD-10-CM

## 2013-04-10 MED ORDER — ALPRAZOLAM 0.5 MG PO TABS: 0.5000 mg | ORAL_TABLET | Freq: Every evening | ORAL | Status: DC | PRN

## 2013-04-10 NOTE — Telephone Encounter (Signed)
Follow up    Pt stated he never received his medication for Xanax that was supposed to have been called in. Pt is completely out and want to know if they can call a prescription in to his pharmacy @ [facility]. Please call pt.

## 2013-04-10 NOTE — Telephone Encounter (Signed)
Returned call to patient's daughter,90 day supply for xanax sent to [facility] 04/06/13.Daughter stated father has not received yet and he is out. Called in xanax [facility] refill to [facility].

## 2013-04-10 NOTE — Telephone Encounter (Signed)
New Problem:    Patient's wife called in wanting to speak with you regarding her husband's medications.  Please call back.

## 2013-04-13 ENCOUNTER — Telehealth: Payer: Self-pay

## 2013-04-13 ENCOUNTER — Telehealth: Payer: Self-pay | Admitting: Cardiology

## 2013-04-13 ENCOUNTER — Other Ambulatory Visit: Payer: Self-pay

## 2013-04-13 MED ORDER — ALPRAZOLAM 0.5 MG PO TABS: ORAL_TABLET | ORAL | Status: DC

## 2013-04-13 NOTE — Telephone Encounter (Signed)
Ms Mckillop called in this morning to the refill line stating that her husbands RX for coreg was never sent in. In Epic it says it was sent to [facility]. I called [facility] and they stated that the medication was sent in but that a local pharmacy was trying to fill the medication also and that's why it was not filled. I tried calling Ms Okubo back to let her know all she has to do is call pharmacy and state she needs it filled and they will do it. The telephone number for Ms Valdes listed in Epic just keeps ringing and ringing and I couldn't leave a message.

## 2013-04-13 NOTE — Telephone Encounter (Signed)
Returned call to patient spoke to wife she stated patient takes alprazolam 0.5 mg three times a day as needed.States wrong directions called in to [facility] alprazolam 0.5 mg three times a day as needed # 90 refill x 3.

## 2013-04-13 NOTE — Telephone Encounter (Signed)
New Prob     States ALPRAZOLAM was sent to pharmacy with the wrong directions and would like to get it fixed.

## 2013-04-30 ENCOUNTER — Ambulatory Visit (INDEPENDENT_AMBULATORY_CARE_PROVIDER_SITE_OTHER): Payer: Medicare Other | Admitting: Pharmacist

## 2013-04-30 DIAGNOSIS — Z7901 Long term (current) use of anticoagulants: Secondary | ICD-10-CM

## 2013-04-30 DIAGNOSIS — I6350 Cerebral infarction due to unspecified occlusion or stenosis of unspecified cerebral artery: Secondary | ICD-10-CM

## 2013-04-30 DIAGNOSIS — I4891 Unspecified atrial fibrillation: Secondary | ICD-10-CM

## 2013-04-30 LAB — POCT INR: INR: 2.8

## 2013-05-18 ENCOUNTER — Other Ambulatory Visit: Payer: Self-pay

## 2013-05-18 MED ORDER — FUROSEMIDE 40 MG PO TABS: 40.0000 mg | ORAL_TABLET | ORAL | Status: DC

## 2013-05-18 MED ORDER — WARFARIN SODIUM 1 MG PO TABS: 1.0000 mg | ORAL_TABLET | ORAL | Status: DC

## 2013-05-18 NOTE — Telephone Encounter (Signed)
03/03/2013 2:30 PM   Office Visit furosemide (LASIX) 40 MG tablet  Take 2 tablets in the morning and 1 tablet in the evening

## 2013-05-28 ENCOUNTER — Ambulatory Visit (INDEPENDENT_AMBULATORY_CARE_PROVIDER_SITE_OTHER): Payer: Medicare Other | Admitting: *Deleted

## 2013-05-28 DIAGNOSIS — I6350 Cerebral infarction due to unspecified occlusion or stenosis of unspecified cerebral artery: Secondary | ICD-10-CM

## 2013-05-28 DIAGNOSIS — I4891 Unspecified atrial fibrillation: Secondary | ICD-10-CM

## 2013-05-28 DIAGNOSIS — Z7901 Long term (current) use of anticoagulants: Secondary | ICD-10-CM

## 2013-05-28 LAB — POCT INR: INR: 2.5

## 2013-06-08 ENCOUNTER — Encounter: Payer: Medicare Other | Admitting: *Deleted

## 2013-06-17 ENCOUNTER — Encounter: Payer: Self-pay | Admitting: *Deleted

## 2013-06-30 ENCOUNTER — Ambulatory Visit: Payer: Medicare Other | Admitting: Cardiology

## 2013-07-06 ENCOUNTER — Other Ambulatory Visit: Payer: Self-pay | Admitting: *Deleted

## 2013-07-06 MED ORDER — FUROSEMIDE 40 MG PO TABS: 40.0000 mg | ORAL_TABLET | ORAL | Status: DC

## 2013-07-08 ENCOUNTER — Ambulatory Visit (INDEPENDENT_AMBULATORY_CARE_PROVIDER_SITE_OTHER): Payer: Medicare Other | Admitting: *Deleted

## 2013-07-08 DIAGNOSIS — I6350 Cerebral infarction due to unspecified occlusion or stenosis of unspecified cerebral artery: Secondary | ICD-10-CM

## 2013-07-08 DIAGNOSIS — I4891 Unspecified atrial fibrillation: Secondary | ICD-10-CM

## 2013-07-08 DIAGNOSIS — Z7901 Long term (current) use of anticoagulants: Secondary | ICD-10-CM

## 2013-07-08 LAB — POCT INR: INR: 2.3

## 2013-07-14 ENCOUNTER — Ambulatory Visit (INDEPENDENT_AMBULATORY_CARE_PROVIDER_SITE_OTHER): Payer: Medicare Other | Admitting: Ophthalmology

## 2013-08-04 ENCOUNTER — Other Ambulatory Visit: Payer: Self-pay | Admitting: *Deleted

## 2013-08-04 MED ORDER — WARFARIN SODIUM 1 MG PO TABS: 1.0000 mg | ORAL_TABLET | ORAL | Status: DC

## 2013-08-17 ENCOUNTER — Other Ambulatory Visit: Payer: Self-pay | Admitting: *Deleted

## 2013-08-17 DIAGNOSIS — I2510 Atherosclerotic heart disease of native coronary artery without angina pectoris: Secondary | ICD-10-CM

## 2013-08-17 MED ORDER — FUROSEMIDE 40 MG PO TABS: 40.0000 mg | ORAL_TABLET | ORAL | Status: DC

## 2013-08-17 MED ORDER — CARVEDILOL 25 MG PO TABS: 25.0000 mg | ORAL_TABLET | Freq: Two times a day (BID) | ORAL | Status: DC

## 2013-08-19 ENCOUNTER — Ambulatory Visit (INDEPENDENT_AMBULATORY_CARE_PROVIDER_SITE_OTHER): Payer: Medicare Other | Admitting: Pharmacist

## 2013-08-19 DIAGNOSIS — Z7901 Long term (current) use of anticoagulants: Secondary | ICD-10-CM

## 2013-08-19 DIAGNOSIS — I6350 Cerebral infarction due to unspecified occlusion or stenosis of unspecified cerebral artery: Secondary | ICD-10-CM

## 2013-08-19 DIAGNOSIS — I4891 Unspecified atrial fibrillation: Secondary | ICD-10-CM

## 2013-08-31 ENCOUNTER — Other Ambulatory Visit: Payer: Self-pay

## 2013-08-31 DIAGNOSIS — I2510 Atherosclerotic heart disease of native coronary artery without angina pectoris: Secondary | ICD-10-CM

## 2013-08-31 MED ORDER — FUROSEMIDE 40 MG PO TABS: ORAL_TABLET | ORAL | Status: DC

## 2013-08-31 MED ORDER — CARVEDILOL 25 MG PO TABS: 25.0000 mg | ORAL_TABLET | Freq: Two times a day (BID) | ORAL | Status: DC

## 2013-09-02 ENCOUNTER — Encounter: Payer: Self-pay | Admitting: Cardiology

## 2013-09-02 ENCOUNTER — Ambulatory Visit (INDEPENDENT_AMBULATORY_CARE_PROVIDER_SITE_OTHER): Payer: Medicare Other | Admitting: Cardiology

## 2013-09-02 VITALS — BP 112/74 | HR 72 | Ht 67.0 in | Wt 153.0 lb

## 2013-09-02 DIAGNOSIS — I2510 Atherosclerotic heart disease of native coronary artery without angina pectoris: Secondary | ICD-10-CM

## 2013-09-02 DIAGNOSIS — I472 Ventricular tachycardia: Secondary | ICD-10-CM

## 2013-09-02 DIAGNOSIS — I4891 Unspecified atrial fibrillation: Secondary | ICD-10-CM

## 2013-09-02 DIAGNOSIS — I5022 Chronic systolic (congestive) heart failure: Secondary | ICD-10-CM

## 2013-09-02 DIAGNOSIS — I10 Essential (primary) hypertension: Secondary | ICD-10-CM

## 2013-09-02 MED ORDER — ALPRAZOLAM 0.5 MG PO TABS: ORAL_TABLET | ORAL | Status: DC

## 2013-09-02 NOTE — Patient Instructions (Addendum)
Continue your current therapy  I will see you 6 months   

## 2013-09-02 NOTE — Progress Notes (Signed)
[name] [name] Date of Birth: 12/10/1941 Medical Record #[id number]  History of Present Illness: [name] [name]  is seen today for followup.  He has a history of coronary disease and is status post CABG in 2001. This included an LIMA graft to the LAD, saphenous vein graft to the first obtuse marginal vessel, and saphenous vein graft to the PDA. Cardiac catheterization in 2005 showed patent grafts. He has a history of chronic atrial fibrillation. He also has a history of ventricular tachycardia and has  a biventricular ICD. This was revised in December 2013. He has ischemic cardiomyopathy with ejection fraction of 30-35%. At the time of his cardiac catheterization in 2005 he suffered an embolic stroke. This is managed timely with thrombectomy with an excellent result. He does have chronic kidney disease which has been stable. On followup today he states he is doing very well. He remains active. He has no significant swelling, dyspnea, or chest pain. He said the dizziness or palpitations. He does complain of his ankles ache in the late afternoon. He reports that he is eating a very healthy diet.  Current Outpatient Prescriptions on File Prior to Visit  Medication Sig Dispense Refill  . allopurinol (ZYLOPRIM) 100 MG tablet Take 1 tablet (100 mg total) by mouth daily.  90 tablet  3  . ALPRAZolam (XANAX) 0.5 MG tablet Take 0.5 mg three times a day as needed  90 tablet  3  . calcitRIOL (ROCALTROL) 0.25 MCG capsule Take 0.25 mcg by mouth daily.       . carvedilol (COREG) 25 MG tablet Take 1 tablet (25 mg total) by mouth 2 (two) times daily.  180 tablet  3  . digoxin (LANOXIN) 0.125 MG tablet Take 1 tablet (125 mcg total) by mouth daily.  90 tablet  3  . furosemide (LASIX) 40 MG tablet Take 2 tablets in the morning and 1 tablet in the evening  21 tablet  0  . lovastatin (MEVACOR) 40 MG tablet Take 1 tablet (40 mg total) by mouth at bedtime.  90 tablet  3  . Multiple Vitamins-Minerals (PRESERVISION AREDS 2) CAPS  Take 1 capsule by mouth 2 (two) times daily.      . vitamin B-12 (CYANOCOBALAMIN) 100 MCG tablet Take 50 mcg by mouth daily.      Marland Kitchen warfarin (COUMADIN) 1 MG tablet Take 1 tablet (1 mg total) by mouth as directed.  120 tablet  0   No current facility-administered medications on file prior to visit.    Allergies  Allergen Reactions  . Nitroglycerin Other (See Comments)    Became severely hypotensive  . Other     Intolerance to ACE inhibitors    Past Medical History  Diagnosis Date  . Coronary artery disease   . Cardiomyopathy, ischemic     EF of 30-35%  . Status post CVA   . VT (ventricular tachycardia) aug of 2010    removed old ICD and replaced with biventricular ICD  . CKD (chronic kidney disease) stage 3, GFR 30-59 ml/min   . Atrial fibrillation     chronic  . Hypertension   . Dyslipidemia   . Peripheral vascular disease   . Thyroid disease     hypo,due to amiodarone therapy,resolved  . Myocardial infarction January 2001     History of anteroseptal myocardial infarction presenting with left bundle-branch block  . Anxiety   . Tobacco abuse     History of 2 pack per day tobacco habituation  . Pacemaker   .  CHF (congestive heart failure)     Past Surgical History  Procedure Laterality Date  . Cholecystectomy    . Femoral bypass      fem-fem bpg, right fem pop bpg  . Icd  07/15/2009    implant Dr. Ladona Ridgel  . Coronary artery bypass graft  2001    x 4  . Cardiac catheterization  2001    PTCA  . Cardiac catheterization  2005    patent grafts  . Cataracts  2006    right eye    History  Smoking status  . Former Smoker  . Quit date: 09/30/2000  Smokeless tobacco  . Not on file    History  Alcohol Use     Family History  Problem Relation Age of Onset  . Heart disease Mother   . Aneurysm Brother   . Stroke Mother   . Stroke Father     Review of Systems: As noted in history of present illness. All other systems were reviewed and are  negative.  Physical Exam: BP 112/74  Pulse 72  Ht 5\' 7"  (1.702 m)  Wt 69.4 kg (153 lb)  BMI 23.96 kg/m2 He is a pleasant white male who appears somewhat chronically ill. HEENT exam is unremarkable. Neck is supple no JVD, adenopathy, thyromegaly, or bruits. Lungs are clear. His ICD site is stable. Cardiac exam reveals a regular rate and rhythm without gallop, murmur, or click. Abdomen is soft and nontender. There are no masses or bruits. Extremities are without edema. Pedal pulses are good. Skin is warm and dry. He is alert and oriented x3. Cranial nerves II through XII are intact. LABORATORY DATA: Reviewed from the nephrologist in July-iron stores are normal. CBC is normal. Urinalysis is normal. Chemistries reveal a glucose of 112. Electrolytes are normal. Creatinine was 1.6.  Assessment / Plan: 1. Chronic systolic congestive heart failure. He is well compensated. We will continue with his current medication including carvedilol, digoxin, and Lasix. Avoid ACE inhibitor/ARB due to to renal insufficiency. I requested his old chart to see when his last echocardiogram was performed. I'll followup again in 6 months. 2. History of ventricular tachycardia. Status post biventricular ICD. 3. Chronic atrial fibrillation. He is on anticoagulation with therapeutic INRs. Rate is very well controlled. 4. Remote CVA. 5. Chronic kidney disease stage III. 6. Coronary disease status post CABG. No recurrent angina.

## 2013-09-07 ENCOUNTER — Encounter: Payer: Self-pay | Admitting: Cardiology

## 2013-09-29 ENCOUNTER — Other Ambulatory Visit: Payer: Self-pay

## 2013-09-29 DIAGNOSIS — I2510 Atherosclerotic heart disease of native coronary artery without angina pectoris: Secondary | ICD-10-CM

## 2013-09-29 MED ORDER — DIGOXIN 125 MCG PO TABS: 125.0000 ug | ORAL_TABLET | Freq: Every day | ORAL | Status: DC

## 2013-10-01 ENCOUNTER — Other Ambulatory Visit: Payer: Self-pay | Admitting: *Deleted

## 2013-10-01 ENCOUNTER — Ambulatory Visit (INDEPENDENT_AMBULATORY_CARE_PROVIDER_SITE_OTHER): Payer: Medicare Other | Admitting: *Deleted

## 2013-10-01 DIAGNOSIS — I2510 Atherosclerotic heart disease of native coronary artery without angina pectoris: Secondary | ICD-10-CM

## 2013-10-01 DIAGNOSIS — I4891 Unspecified atrial fibrillation: Secondary | ICD-10-CM

## 2013-10-01 DIAGNOSIS — Z7901 Long term (current) use of anticoagulants: Secondary | ICD-10-CM

## 2013-10-01 DIAGNOSIS — I6350 Cerebral infarction due to unspecified occlusion or stenosis of unspecified cerebral artery: Secondary | ICD-10-CM

## 2013-10-01 MED ORDER — DIGOXIN 125 MCG PO TABS: 125.0000 ug | ORAL_TABLET | Freq: Every day | ORAL | Status: DC

## 2013-10-11 ENCOUNTER — Other Ambulatory Visit: Payer: Self-pay | Admitting: Cardiology

## 2013-10-22 ENCOUNTER — Telehealth: Payer: Self-pay

## 2013-10-22 ENCOUNTER — Ambulatory Visit (INDEPENDENT_AMBULATORY_CARE_PROVIDER_SITE_OTHER): Payer: Medicare Other | Admitting: Pharmacist

## 2013-10-22 DIAGNOSIS — Z7901 Long term (current) use of anticoagulants: Secondary | ICD-10-CM

## 2013-10-22 DIAGNOSIS — I4891 Unspecified atrial fibrillation: Secondary | ICD-10-CM

## 2013-10-22 DIAGNOSIS — I6350 Cerebral infarction due to unspecified occlusion or stenosis of unspecified cerebral artery: Secondary | ICD-10-CM

## 2013-10-22 MED ORDER — ALPRAZOLAM 0.5 MG PO TABS: ORAL_TABLET | ORAL | Status: DC

## 2013-10-22 NOTE — Telephone Encounter (Signed)
Patient called spoke to wife received a message from Dr.Jordan ok to refill alprazolam with 90 tablets and refills.Prescription called into pharmacy.

## 2013-11-19 ENCOUNTER — Ambulatory Visit (INDEPENDENT_AMBULATORY_CARE_PROVIDER_SITE_OTHER): Payer: Medicare Other | Admitting: *Deleted

## 2013-11-19 DIAGNOSIS — Z7901 Long term (current) use of anticoagulants: Secondary | ICD-10-CM

## 2013-11-19 DIAGNOSIS — I4891 Unspecified atrial fibrillation: Secondary | ICD-10-CM

## 2013-11-19 DIAGNOSIS — I6350 Cerebral infarction due to unspecified occlusion or stenosis of unspecified cerebral artery: Secondary | ICD-10-CM

## 2013-11-19 LAB — POCT INR: INR: 1.9

## 2013-12-16 ENCOUNTER — Ambulatory Visit (INDEPENDENT_AMBULATORY_CARE_PROVIDER_SITE_OTHER): Payer: Medicare Other | Admitting: Pharmacist

## 2013-12-16 DIAGNOSIS — I4891 Unspecified atrial fibrillation: Secondary | ICD-10-CM

## 2013-12-16 DIAGNOSIS — I6350 Cerebral infarction due to unspecified occlusion or stenosis of unspecified cerebral artery: Secondary | ICD-10-CM

## 2013-12-16 DIAGNOSIS — Z7901 Long term (current) use of anticoagulants: Secondary | ICD-10-CM

## 2013-12-16 LAB — POCT INR: INR: 1.8

## 2013-12-17 ENCOUNTER — Encounter: Payer: Self-pay | Admitting: *Deleted

## 2014-01-06 ENCOUNTER — Ambulatory Visit (INDEPENDENT_AMBULATORY_CARE_PROVIDER_SITE_OTHER): Payer: Medicare Other | Admitting: *Deleted

## 2014-01-06 DIAGNOSIS — Z7901 Long term (current) use of anticoagulants: Secondary | ICD-10-CM

## 2014-01-06 DIAGNOSIS — I6350 Cerebral infarction due to unspecified occlusion or stenosis of unspecified cerebral artery: Secondary | ICD-10-CM

## 2014-01-06 DIAGNOSIS — I4891 Unspecified atrial fibrillation: Secondary | ICD-10-CM

## 2014-01-06 DIAGNOSIS — Z5181 Encounter for therapeutic drug level monitoring: Secondary | ICD-10-CM | POA: Insufficient documentation

## 2014-01-06 LAB — POCT INR: INR: 2.1

## 2014-01-12 ENCOUNTER — Other Ambulatory Visit: Payer: Self-pay | Admitting: *Deleted

## 2014-01-12 MED ORDER — WARFARIN SODIUM 1 MG PO TABS: 1.0000 mg | ORAL_TABLET | ORAL | Status: DC

## 2014-01-18 ENCOUNTER — Other Ambulatory Visit: Payer: Self-pay

## 2014-01-18 DIAGNOSIS — E785 Hyperlipidemia, unspecified: Secondary | ICD-10-CM

## 2014-01-18 MED ORDER — ALLOPURINOL 100 MG PO TABS: 100.0000 mg | ORAL_TABLET | Freq: Every day | ORAL | Status: DC

## 2014-01-18 MED ORDER — LOVASTATIN 40 MG PO TABS: 40.0000 mg | ORAL_TABLET | Freq: Every day | ORAL | Status: DC

## 2014-01-20 ENCOUNTER — Other Ambulatory Visit: Payer: Self-pay | Admitting: *Deleted

## 2014-01-20 DIAGNOSIS — E785 Hyperlipidemia, unspecified: Secondary | ICD-10-CM

## 2014-01-20 MED ORDER — ALLOPURINOL 100 MG PO TABS: 100.0000 mg | ORAL_TABLET | Freq: Every day | ORAL | Status: DC

## 2014-01-20 MED ORDER — LOVASTATIN 40 MG PO TABS: 40.0000 mg | ORAL_TABLET | Freq: Every day | ORAL | Status: DC

## 2014-01-29 ENCOUNTER — Other Ambulatory Visit: Payer: Self-pay

## 2014-01-29 MED ORDER — FUROSEMIDE 40 MG PO TABS: ORAL_TABLET | ORAL | Status: DC

## 2014-02-03 ENCOUNTER — Ambulatory Visit (INDEPENDENT_AMBULATORY_CARE_PROVIDER_SITE_OTHER): Payer: Medicare Other | Admitting: *Deleted

## 2014-02-03 DIAGNOSIS — Z5181 Encounter for therapeutic drug level monitoring: Secondary | ICD-10-CM

## 2014-02-03 DIAGNOSIS — I6350 Cerebral infarction due to unspecified occlusion or stenosis of unspecified cerebral artery: Secondary | ICD-10-CM

## 2014-02-03 DIAGNOSIS — Z7901 Long term (current) use of anticoagulants: Secondary | ICD-10-CM

## 2014-02-03 DIAGNOSIS — I4891 Unspecified atrial fibrillation: Secondary | ICD-10-CM

## 2014-02-03 LAB — POCT INR: INR: 2.1

## 2014-03-09 ENCOUNTER — Ambulatory Visit: Payer: Medicare Other | Admitting: Cardiology

## 2014-03-15 ENCOUNTER — Other Ambulatory Visit: Payer: Self-pay | Admitting: *Deleted

## 2014-03-15 DIAGNOSIS — E785 Hyperlipidemia, unspecified: Secondary | ICD-10-CM

## 2014-03-15 MED ORDER — LOVASTATIN 40 MG PO TABS: 40.0000 mg | ORAL_TABLET | Freq: Every day | ORAL | Status: DC

## 2014-03-15 MED ORDER — ALLOPURINOL 100 MG PO TABS: 100.0000 mg | ORAL_TABLET | Freq: Every day | ORAL | Status: DC

## 2014-03-19 ENCOUNTER — Ambulatory Visit (INDEPENDENT_AMBULATORY_CARE_PROVIDER_SITE_OTHER): Payer: Medicare Other

## 2014-03-19 DIAGNOSIS — I4891 Unspecified atrial fibrillation: Secondary | ICD-10-CM

## 2014-03-19 DIAGNOSIS — I6350 Cerebral infarction due to unspecified occlusion or stenosis of unspecified cerebral artery: Secondary | ICD-10-CM

## 2014-03-19 DIAGNOSIS — Z7901 Long term (current) use of anticoagulants: Secondary | ICD-10-CM

## 2014-03-19 DIAGNOSIS — Z5181 Encounter for therapeutic drug level monitoring: Secondary | ICD-10-CM

## 2014-03-19 LAB — POCT INR: INR: 3.2

## 2014-04-01 ENCOUNTER — Encounter: Payer: Self-pay | Admitting: Internal Medicine

## 2014-04-09 ENCOUNTER — Encounter: Payer: Self-pay | Admitting: Internal Medicine

## 2014-04-09 ENCOUNTER — Ambulatory Visit (INDEPENDENT_AMBULATORY_CARE_PROVIDER_SITE_OTHER): Payer: Medicare Other

## 2014-04-09 ENCOUNTER — Encounter (INDEPENDENT_AMBULATORY_CARE_PROVIDER_SITE_OTHER): Payer: Self-pay

## 2014-04-09 ENCOUNTER — Ambulatory Visit (INDEPENDENT_AMBULATORY_CARE_PROVIDER_SITE_OTHER): Payer: Medicare Other | Admitting: Cardiology

## 2014-04-09 ENCOUNTER — Encounter: Payer: Self-pay | Admitting: Cardiology

## 2014-04-09 ENCOUNTER — Encounter (INDEPENDENT_AMBULATORY_CARE_PROVIDER_SITE_OTHER): Payer: Medicare Other | Admitting: *Deleted

## 2014-04-09 VITALS — BP 110/64 | HR 79 | Ht 67.0 in | Wt 157.0 lb

## 2014-04-09 DIAGNOSIS — I428 Other cardiomyopathies: Secondary | ICD-10-CM

## 2014-04-09 DIAGNOSIS — I4891 Unspecified atrial fibrillation: Secondary | ICD-10-CM

## 2014-04-09 DIAGNOSIS — I472 Ventricular tachycardia, unspecified: Secondary | ICD-10-CM

## 2014-04-09 DIAGNOSIS — Z7901 Long term (current) use of anticoagulants: Secondary | ICD-10-CM

## 2014-04-09 DIAGNOSIS — I5022 Chronic systolic (congestive) heart failure: Secondary | ICD-10-CM

## 2014-04-09 DIAGNOSIS — I2510 Atherosclerotic heart disease of native coronary artery without angina pectoris: Secondary | ICD-10-CM

## 2014-04-09 DIAGNOSIS — Z5181 Encounter for therapeutic drug level monitoring: Secondary | ICD-10-CM

## 2014-04-09 DIAGNOSIS — I4729 Other ventricular tachycardia: Secondary | ICD-10-CM

## 2014-04-09 DIAGNOSIS — I6350 Cerebral infarction due to unspecified occlusion or stenosis of unspecified cerebral artery: Secondary | ICD-10-CM

## 2014-04-09 LAB — MDC_IDC_ENUM_SESS_TYPE_INCLINIC
Battery Remaining Longevity: 71 mo
Brady Statistic AP VP Percent: 0 %
Brady Statistic AP VS Percent: 0 %
Brady Statistic AS VP Percent: 80.95 %
Brady Statistic AS VS Percent: 19.05 %
Date Time Interrogation Session: 20150501114346
HighPow Impedance: 209 Ohm
HighPow Impedance: 48 Ohm
HighPow Impedance: 61 Ohm
Lead Channel Impedance Value: 323 Ohm
Lead Channel Impedance Value: 437 Ohm
Lead Channel Impedance Value: 665 Ohm
Lead Channel Pacing Threshold Amplitude: 1 V
Lead Channel Pacing Threshold Amplitude: 1.25 V
Lead Channel Sensing Intrinsic Amplitude: 1.125 mV
Lead Channel Setting Pacing Amplitude: 2.5 V
MDC IDC MSMT BATTERY VOLTAGE: 2.96 V
MDC IDC MSMT LEADCHNL LV IMPEDANCE VALUE: 836 Ohm
MDC IDC MSMT LEADCHNL LV PACING THRESHOLD PULSEWIDTH: 0.8 ms
MDC IDC MSMT LEADCHNL RV IMPEDANCE VALUE: 665 Ohm
MDC IDC MSMT LEADCHNL RV PACING THRESHOLD PULSEWIDTH: 0.8 ms
MDC IDC SET LEADCHNL LV PACING AMPLITUDE: 2.25 V
MDC IDC SET LEADCHNL LV PACING PULSEWIDTH: 0.8 ms
MDC IDC SET LEADCHNL RV PACING PULSEWIDTH: 0.8 ms
MDC IDC SET LEADCHNL RV SENSING SENSITIVITY: 0.3 mV
MDC IDC SET ZONE DETECTION INTERVAL: 320 ms
MDC IDC SET ZONE DETECTION INTERVAL: 400 ms
MDC IDC STAT BRADY RA PERCENT PACED: 0 %
MDC IDC STAT BRADY RV PERCENT PACED: 86.45 %
Zone Setting Detection Interval: 350 ms
Zone Setting Detection Interval: 450 ms

## 2014-04-09 LAB — POCT INR: INR: 3.2

## 2014-04-09 NOTE — Patient Instructions (Signed)
Continue your current therapy  We will schedule you for fasting lab work and a Dig level.  We will schedule you for an Echocardiogram.  I will see you in 6 months

## 2014-04-09 NOTE — Progress Notes (Signed)
[name] LassoDonnie J [name] Date of Birth: December 30, 1940 Medical Record #[id number]  History of Present Illness: [name] BoringDonnie  is seen today for followup.  He has a history of coronary disease and is status post CABG in 2001. This included an LIMA graft to the LAD, saphenous vein graft to the first obtuse marginal vessel, and saphenous vein graft to the PDA. Cardiac catheterization in 2005 showed patent grafts. He has a history of chronic atrial fibrillation. He also has a history of ventricular tachycardia and has  a biventricular ICD. This was revised in December 2013. He has ischemic cardiomyopathy with ejection fraction of 30-35%. At the time of his cardiac catheterization in 2005 he suffered an embolic stroke. This is managed timely with thrombectomy with an excellent result. He does have chronic kidney disease and is followed by nephrology. On followup today he states he is doing very well. He remains active. He has no significant swelling, dyspnea, or chest pain. He did give out recently while doing some work on a ladder. He states he has slowed down some but still does all his own yardwork.  Current Outpatient Prescriptions on File Prior to Visit  Medication Sig Dispense Refill  . allopurinol (ZYLOPRIM) 100 MG tablet Take 1 tablet (100 mg total) by mouth daily.  30 tablet  0  . ALPRAZolam (XANAX) 0.5 MG tablet Take 0.5 mg three times a day as needed  90 tablet  3  . calcitRIOL (ROCALTROL) 0.25 MCG capsule Take 0.25 mcg by mouth daily.       . carvedilol (COREG) 25 MG tablet Take 1 tablet (25 mg total) by mouth 2 (two) times daily.  180 tablet  3  . digoxin (LANOXIN) 0.125 MG tablet Take 1 tablet (125 mcg total) by mouth daily.  90 tablet  1  . furosemide (LASIX) 40 MG tablet Take 2 tablets in the morning and 1 tablet in the evening  270 tablet  2  . lovastatin (MEVACOR) 40 MG tablet Take 1 tablet (40 mg total) by mouth at bedtime.  30 tablet  0  . Multiple Vitamins-Minerals (PRESERVISION AREDS 2) CAPS Take 1  capsule by mouth 2 (two) times daily.      . vitamin B-12 (CYANOCOBALAMIN) 100 MCG tablet Take 50 mcg by mouth daily.      Marland Kitchen. warfarin (COUMADIN) 1 MG tablet Take 1 tablet (1 mg total) by mouth as directed.  120 tablet  1   No current facility-administered medications on file prior to visit.    Allergies  Allergen Reactions  . Nitroglycerin Other (See Comments)    Became severely hypotensive  . Other     Intolerance to ACE inhibitors    Past Medical History  Diagnosis Date  . Coronary artery disease   . Cardiomyopathy, ischemic     EF of 30-35%  . Status post CVA   . VT (ventricular tachycardia) aug of 2010    removed old ICD and replaced with biventricular ICD  . CKD (chronic kidney disease) stage 3, GFR 30-59 ml/min   . Atrial fibrillation     chronic  . Hypertension   . Dyslipidemia   . Peripheral vascular disease   . Thyroid disease     hypo,due to amiodarone therapy,resolved  . Myocardial infarction January 2001     History of anteroseptal myocardial infarction presenting with left bundle-branch block  . Anxiety   . Tobacco abuse     History of 2 pack per day tobacco habituation  . Pacemaker   .  CHF (congestive heart failure)     Past Surgical History  Procedure Laterality Date  . Cholecystectomy    . Femoral bypass      fem-fem bpg, right fem pop bpg  . Icd  07/15/2009    implant Dr. Ladona Ridgel  . Coronary artery bypass graft  2001    x 4  . Cardiac catheterization  2001    PTCA  . Cardiac catheterization  2005    patent grafts  . Cataracts  2006    right eye    History  Smoking status  . Former Smoker  . Quit date: 09/30/2000  Smokeless tobacco  . Not on file    History  Alcohol Use     Family History  Problem Relation Age of Onset  . Heart disease Mother   . Aneurysm Brother   . Stroke Mother   . Stroke Father     Review of Systems: As noted in history of present illness. All other systems were reviewed and are negative.  Physical  Exam: BP 110/64  Pulse 79  Ht 5\' 7"  (1.702 m)  Wt 157 lb (71.215 kg)  BMI 24.58 kg/m2 He is a pleasant white male who appears somewhat chronically ill. HEENT exam is unremarkable. Neck is supple no JVD, adenopathy, thyromegaly, or bruits. Lungs are clear. His ICD site is stable. Cardiac exam reveals a regular rate and rhythm without gallop, murmur, or click. Abdomen is soft and nontender. There are no masses or bruits. Extremities are without edema. Pedal pulses are good. Skin is warm and dry. He is alert and oriented x3. Cranial nerves II through XII are intact. LABORATORY DATA: Reviewed from the nephrologist in February-creatinine stable at 1.59. UA is clear. Electrolytes were normal.  Ecg today V paced rate 79 bpm  Assessment / Plan: 1. Chronic systolic congestive heart failure. He is well compensated. We will continue with his current medication including carvedilol, digoxin, and Lasix. Avoid ACE inhibitor/ARB due to to renal insufficiency. Will update his Echo since his last study was in 2010. Will have him return for fasting lipid panel and Dig level. I will see him in 6 months.  2. History of ventricular tachycardia. Status post biventricular ICD. Has not had ICD check since March 2014. To see Dr. Ladona Ridgel today.  3. Chronic atrial fibrillation. He is on anticoagulation with therapeutic INRs. Rate is very well controlled. Check INR today.  4. Remote CVA.  5. Chronic kidney disease stage III. Stable.  6. Coronary disease status post CABG. No recurrent angina.

## 2014-04-14 ENCOUNTER — Other Ambulatory Visit (INDEPENDENT_AMBULATORY_CARE_PROVIDER_SITE_OTHER): Payer: Medicare Other

## 2014-04-14 DIAGNOSIS — I2510 Atherosclerotic heart disease of native coronary artery without angina pectoris: Secondary | ICD-10-CM

## 2014-04-14 DIAGNOSIS — I5022 Chronic systolic (congestive) heart failure: Secondary | ICD-10-CM

## 2014-04-14 DIAGNOSIS — I472 Ventricular tachycardia, unspecified: Secondary | ICD-10-CM

## 2014-04-14 DIAGNOSIS — I4891 Unspecified atrial fibrillation: Secondary | ICD-10-CM

## 2014-04-14 DIAGNOSIS — I4729 Other ventricular tachycardia: Secondary | ICD-10-CM

## 2014-04-14 LAB — LIPID PANEL
CHOLESTEROL: 81 mg/dL (ref 0–200)
HDL: 27.5 mg/dL — ABNORMAL LOW (ref >39.00)
LDL Cholesterol: 33 mg/dL (ref 0–99)
Total CHOL/HDL Ratio: 3
Triglycerides: 102 mg/dL (ref 0.0–149.0)
VLDL: 20.4 mg/dL (ref 0.0–40.0)

## 2014-04-15 LAB — DIGOXIN LEVEL: Digoxin Level: 1.8 ng/mL (ref 0.8–2.0)

## 2014-04-16 ENCOUNTER — Other Ambulatory Visit: Payer: Self-pay

## 2014-04-28 ENCOUNTER — Ambulatory Visit (INDEPENDENT_AMBULATORY_CARE_PROVIDER_SITE_OTHER): Payer: Medicare Other | Admitting: Surgery

## 2014-04-28 DIAGNOSIS — Z7901 Long term (current) use of anticoagulants: Secondary | ICD-10-CM

## 2014-04-28 DIAGNOSIS — Z5181 Encounter for therapeutic drug level monitoring: Secondary | ICD-10-CM

## 2014-04-28 DIAGNOSIS — I6350 Cerebral infarction due to unspecified occlusion or stenosis of unspecified cerebral artery: Secondary | ICD-10-CM

## 2014-04-28 DIAGNOSIS — I4891 Unspecified atrial fibrillation: Secondary | ICD-10-CM

## 2014-04-28 LAB — POCT INR: INR: 2.7

## 2014-05-05 ENCOUNTER — Other Ambulatory Visit: Payer: Self-pay | Admitting: *Deleted

## 2014-05-05 MED ORDER — ALPRAZOLAM 0.5 MG PO TABS: ORAL_TABLET | ORAL | Status: DC

## 2014-05-10 ENCOUNTER — Ambulatory Visit (HOSPITAL_COMMUNITY): Payer: Medicare Other | Attending: Cardiovascular Disease | Admitting: Cardiology

## 2014-05-10 DIAGNOSIS — I5022 Chronic systolic (congestive) heart failure: Secondary | ICD-10-CM

## 2014-05-10 DIAGNOSIS — I4891 Unspecified atrial fibrillation: Secondary | ICD-10-CM | POA: Insufficient documentation

## 2014-05-10 DIAGNOSIS — Z951 Presence of aortocoronary bypass graft: Secondary | ICD-10-CM | POA: Insufficient documentation

## 2014-05-10 DIAGNOSIS — I472 Ventricular tachycardia, unspecified: Secondary | ICD-10-CM

## 2014-05-10 DIAGNOSIS — I2510 Atherosclerotic heart disease of native coronary artery without angina pectoris: Secondary | ICD-10-CM | POA: Insufficient documentation

## 2014-05-10 DIAGNOSIS — I255 Ischemic cardiomyopathy: Secondary | ICD-10-CM

## 2014-05-10 DIAGNOSIS — I509 Heart failure, unspecified: Secondary | ICD-10-CM | POA: Insufficient documentation

## 2014-05-10 DIAGNOSIS — F17200 Nicotine dependence, unspecified, uncomplicated: Secondary | ICD-10-CM | POA: Insufficient documentation

## 2014-05-10 NOTE — Progress Notes (Signed)
Echo performed. 

## 2014-05-25 ENCOUNTER — Ambulatory Visit (INDEPENDENT_AMBULATORY_CARE_PROVIDER_SITE_OTHER): Payer: Medicare Other

## 2014-05-25 ENCOUNTER — Other Ambulatory Visit: Payer: Self-pay

## 2014-05-25 DIAGNOSIS — Z7901 Long term (current) use of anticoagulants: Secondary | ICD-10-CM

## 2014-05-25 DIAGNOSIS — I6350 Cerebral infarction due to unspecified occlusion or stenosis of unspecified cerebral artery: Secondary | ICD-10-CM

## 2014-05-25 DIAGNOSIS — I4891 Unspecified atrial fibrillation: Secondary | ICD-10-CM

## 2014-05-25 DIAGNOSIS — Z5181 Encounter for therapeutic drug level monitoring: Secondary | ICD-10-CM

## 2014-05-25 LAB — POCT INR: INR: 2.3

## 2014-05-25 MED ORDER — ALLOPURINOL 100 MG PO TABS: 100.0000 mg | ORAL_TABLET | Freq: Every day | ORAL | Status: DC

## 2014-05-31 ENCOUNTER — Other Ambulatory Visit: Payer: Self-pay | Admitting: *Deleted

## 2014-05-31 ENCOUNTER — Telehealth: Payer: Self-pay | Admitting: *Deleted

## 2014-05-31 DIAGNOSIS — E785 Hyperlipidemia, unspecified: Secondary | ICD-10-CM

## 2014-05-31 MED ORDER — WARFARIN SODIUM 1 MG PO TABS: ORAL_TABLET | ORAL | Status: DC

## 2014-05-31 MED ORDER — LOVASTATIN 40 MG PO TABS: 40.0000 mg | ORAL_TABLET | Freq: Every day | ORAL | Status: DC

## 2014-05-31 MED ORDER — ALLOPURINOL 100 MG PO TABS: 100.0000 mg | ORAL_TABLET | Freq: Every day | ORAL | Status: DC

## 2014-05-31 MED ORDER — FUROSEMIDE 40 MG PO TABS: ORAL_TABLET | ORAL | Status: DC

## 2014-05-31 MED ORDER — DIGOXIN 125 MCG PO TABS: ORAL_TABLET | ORAL | Status: DC

## 2014-05-31 NOTE — Telephone Encounter (Signed)
Patient needs warfarin refill sent to [facility]. Thanks, MI

## 2014-06-27 ENCOUNTER — Telehealth: Payer: Self-pay | Admitting: Internal Medicine

## 2014-06-27 NOTE — Telephone Encounter (Signed)
[name] [name] called to tell us he had his ICD fire on Thursday. He had no chest pain/warning/lightheadedness and felt fine afterwards. He was speaking with a friend today who told him he should notify his cardiologist. He tells me he's been feeling fine but wanted [place] to know. I instructed him to call clinic tomorrow AM unless he has any warning signs. He assures me he feels well, compliant with medications.

## 2014-06-28 ENCOUNTER — Ambulatory Visit: Payer: Medicare Other | Admitting: Internal Medicine

## 2014-06-28 ENCOUNTER — Ambulatory Visit (INDEPENDENT_AMBULATORY_CARE_PROVIDER_SITE_OTHER): Payer: Medicare Other | Admitting: *Deleted

## 2014-06-28 ENCOUNTER — Other Ambulatory Visit (INDEPENDENT_AMBULATORY_CARE_PROVIDER_SITE_OTHER): Payer: Medicare Other

## 2014-06-28 DIAGNOSIS — I509 Heart failure, unspecified: Secondary | ICD-10-CM

## 2014-06-28 DIAGNOSIS — R5381 Other malaise: Secondary | ICD-10-CM

## 2014-06-28 DIAGNOSIS — I2589 Other forms of chronic ischemic heart disease: Secondary | ICD-10-CM

## 2014-06-28 DIAGNOSIS — Z79899 Other long term (current) drug therapy: Secondary | ICD-10-CM

## 2014-06-28 DIAGNOSIS — R5383 Other fatigue: Secondary | ICD-10-CM

## 2014-06-28 DIAGNOSIS — I4891 Unspecified atrial fibrillation: Secondary | ICD-10-CM

## 2014-06-28 DIAGNOSIS — I6350 Cerebral infarction due to unspecified occlusion or stenosis of unspecified cerebral artery: Secondary | ICD-10-CM

## 2014-06-28 DIAGNOSIS — Z7901 Long term (current) use of anticoagulants: Secondary | ICD-10-CM

## 2014-06-28 DIAGNOSIS — Z5181 Encounter for therapeutic drug level monitoring: Secondary | ICD-10-CM

## 2014-06-28 LAB — MDC_IDC_ENUM_SESS_TYPE_INCLINIC
Battery Remaining Longevity: 3.8
Brady Statistic AP VP Percent: 0 %
Brady Statistic AS VP Percent: 89.2 %
Brady Statistic AS VS Percent: 10.8 %
Lead Channel Impedance Value: 437 Ohm
Lead Channel Pacing Threshold Amplitude: 1.25 V
Lead Channel Pacing Threshold Amplitude: 1.5 V
Lead Channel Pacing Threshold Pulse Width: 0.4 ms
Lead Channel Sensing Intrinsic Amplitude: 1.9 mV
Lead Channel Sensing Intrinsic Amplitude: 13.5 mV
Lead Channel Setting Pacing Amplitude: 2.25 V
Lead Channel Setting Pacing Amplitude: 2.5 V
Lead Channel Setting Pacing Pulse Width: 0.8 ms
Lead Channel Setting Pacing Pulse Width: 0.8 ms
MDC IDC MSMT LEADCHNL LV IMPEDANCE VALUE: 608 Ohm
MDC IDC MSMT LEADCHNL LV PACING THRESHOLD PULSEWIDTH: 0.8 ms
MDC IDC MSMT LEADCHNL RV IMPEDANCE VALUE: 665 Ohm
MDC IDC SET LEADCHNL RV SENSING SENSITIVITY: 0.3 mV
MDC IDC SET ZONE DETECTION INTERVAL: 320 ms
MDC IDC STAT BRADY AP VS PERCENT: 0 %
Zone Setting Detection Interval: 350 ms
Zone Setting Detection Interval: 400 ms
Zone Setting Detection Interval: 450 ms

## 2014-06-28 LAB — POCT INR: INR: 1.9

## 2014-06-28 MED ORDER — AMIODARONE HCL 200 MG PO TABS: 200.0000 mg | ORAL_TABLET | Freq: Two times a day (BID) | ORAL | Status: DC

## 2014-06-29 ENCOUNTER — Other Ambulatory Visit: Payer: Self-pay

## 2014-06-29 DIAGNOSIS — E876 Hypokalemia: Secondary | ICD-10-CM

## 2014-06-29 LAB — COMPREHENSIVE METABOLIC PANEL
ALT: 12 U/L (ref 0–53)
AST: 21 U/L (ref 0–37)
Albumin: 4 g/dL (ref 3.5–5.2)
Alkaline Phosphatase: 96 U/L (ref 39–117)
BILIRUBIN TOTAL: 1 mg/dL (ref 0.2–1.2)
BUN: 27 mg/dL — ABNORMAL HIGH (ref 6–23)
CALCIUM: 9.9 mg/dL (ref 8.4–10.5)
CHLORIDE: 106 meq/L (ref 96–112)
CO2: 27 mEq/L (ref 19–32)
CREATININE: 1.7 mg/dL — AB (ref 0.4–1.5)
GFR: 41.63 mL/min — ABNORMAL LOW (ref >60.00)
GLUCOSE: 81 mg/dL (ref 70–99)
Potassium: 3.3 mEq/L — ABNORMAL LOW (ref 3.5–5.1)
Sodium: 141 mEq/L (ref 135–145)
TOTAL PROTEIN: 7.3 g/dL (ref 6.0–8.3)

## 2014-06-29 LAB — TSH: TSH: 2.83 u[IU]/mL (ref 0.35–4.50)

## 2014-06-29 MED ORDER — POTASSIUM CHLORIDE ER 10 MEQ PO TBCR: 10.0000 meq | EXTENDED_RELEASE_TABLET | Freq: Every day | ORAL | Status: DC

## 2014-07-06 ENCOUNTER — Telehealth: Payer: Self-pay | Admitting: *Deleted

## 2014-07-06 NOTE — Telephone Encounter (Signed)
Pt's wife states pt was not feeling well and called to make appointment on 7/24 for 7/27. Pt feeling better so he did not need his device checked on 7/27. Pt knows she has a coumadin appt on 8/[address] at [address] Dr. Ladona Ridgel appt at [address] on 9/2, & Dr. [name] at [place] on 10/28. Pt will also have blood drawn on 8/3 after coumadin check.

## 2014-07-06 NOTE — Telephone Encounter (Signed)
Attempted to contact pt to understand why he made appt w/ device clinic on 07/02/14 for 07/05/14 (although he was seen 06/28/14) but did not show up. Phone rings continuously w/out VM picking up.

## 2014-07-12 ENCOUNTER — Ambulatory Visit (INDEPENDENT_AMBULATORY_CARE_PROVIDER_SITE_OTHER): Payer: Medicare Other | Admitting: Pharmacist

## 2014-07-12 DIAGNOSIS — I6350 Cerebral infarction due to unspecified occlusion or stenosis of unspecified cerebral artery: Secondary | ICD-10-CM

## 2014-07-12 DIAGNOSIS — Z5181 Encounter for therapeutic drug level monitoring: Secondary | ICD-10-CM

## 2014-07-12 DIAGNOSIS — I4891 Unspecified atrial fibrillation: Secondary | ICD-10-CM

## 2014-07-12 DIAGNOSIS — Z7901 Long term (current) use of anticoagulants: Secondary | ICD-10-CM

## 2014-07-12 LAB — POCT INR: INR: 2.3

## 2014-07-15 ENCOUNTER — Encounter: Payer: Medicare Other | Admitting: Internal Medicine

## 2014-07-20 ENCOUNTER — Ambulatory Visit (INDEPENDENT_AMBULATORY_CARE_PROVIDER_SITE_OTHER): Payer: Medicare Other | Admitting: *Deleted

## 2014-07-20 DIAGNOSIS — I10 Essential (primary) hypertension: Secondary | ICD-10-CM

## 2014-07-20 LAB — BASIC METABOLIC PANEL
BUN: 38 mg/dL — ABNORMAL HIGH (ref 6–23)
CHLORIDE: 102 meq/L (ref 96–112)
CO2: 23 mEq/L (ref 19–32)
Calcium: 9.6 mg/dL (ref 8.4–10.5)
Creatinine, Ser: 2.6 mg/dL — ABNORMAL HIGH (ref 0.4–1.5)
GFR: 25.89 mL/min — ABNORMAL LOW (ref >60.00)
GLUCOSE: 115 mg/dL — AB (ref 70–99)
POTASSIUM: 3.8 meq/L (ref 3.5–5.1)
SODIUM: 137 meq/L (ref 135–145)

## 2014-07-21 ENCOUNTER — Telehealth: Payer: Self-pay | Admitting: Internal Medicine

## 2014-07-21 DIAGNOSIS — R7989 Other specified abnormal findings of blood chemistry: Secondary | ICD-10-CM

## 2014-07-21 DIAGNOSIS — R799 Abnormal finding of blood chemistry, unspecified: Secondary | ICD-10-CM

## 2014-07-21 NOTE — Telephone Encounter (Signed)
    On 07/20/14. Per Scherrie Bateman LPN. PER DR MCALHANY HOLD LASIX TONIGHT AND AM DOSE TOMORROW .OFFICE TO CALL PT TOMORROW IF HAVE NOT HEARD FROM OFFICE TO CALL AND LEAVE MESSAGE FOR TRIAGE TO ADDRESS ELEVATED BUN AND CR ./CY   Pt called today to see if the BUN 38 from 27 and creatinine levels 2.6 from 1.7 on 7/20, have been addressed. Pt is aware that I will send the message to MD and nurse for recommendations.

## 2014-07-21 NOTE — Telephone Encounter (Signed)
New message           Pt wife calling about pt lab results

## 2014-07-22 NOTE — Telephone Encounter (Signed)
Spoke to patient's daughter she stated father's ICD discharged 3 times recently.Stated Dr.Taylor ordered bmet on 07/20/14.Stated they received a call 07/20/14 with results.Stated father was told to hold furosemide that afternoon and hold furosemide 07/21/14.Stated father takes furosemide 40 mg 2 tablets am and 1 tablet pm.Stated she called Dr.Taylor's office yesterday 07/21/14 for advice on how to take furosemide and was told Dr.Jordan will need to advise.Dr.Jordan out of office this week.Stated father took furosemide 40 mg 2 tablets this morning.Advised to hold pm dose of furosemide.Advised will need to repeat bmet this afternoon.Stated father is too tired to go to lab this afternoon.Advised to go to [facility] at [employer] morning 07/23/14 for a stat bmet.Dr.Crensahw's nurse Deliah Goody RN will call back 07/23/14 with results and instructions for furosemide.

## 2014-07-23 ENCOUNTER — Other Ambulatory Visit: Payer: Self-pay | Admitting: *Deleted

## 2014-07-23 DIAGNOSIS — N289 Disorder of kidney and ureter, unspecified: Secondary | ICD-10-CM

## 2014-07-23 LAB — BASIC METABOLIC PANEL
BUN: 36 mg/dL — AB (ref 6–23)
CHLORIDE: 104 meq/L (ref 96–112)
CO2: 25 meq/L (ref 19–32)
CREATININE: 2.56 mg/dL — AB (ref 0.50–1.35)
Calcium: 10.5 mg/dL (ref 8.4–10.5)
GLUCOSE: 101 mg/dL — AB (ref 70–99)
Potassium: 4.7 mEq/L (ref 3.5–5.3)
Sodium: 139 mEq/L (ref 135–145)

## 2014-07-23 MED ORDER — FUROSEMIDE 40 MG PO TABS: ORAL_TABLET | ORAL | Status: DC

## 2014-07-25 NOTE — Telephone Encounter (Signed)
I would reduce lasix to 80 mg daily. Call if weight or swelling increase.  Peter [name] MD, [facility]

## 2014-07-26 ENCOUNTER — Telehealth: Payer: Self-pay | Admitting: Cardiology

## 2014-07-26 LAB — BASIC METABOLIC PANEL WITH GFR
BUN: 31 mg/dL — AB (ref 6–23)
CO2: 24 mEq/L (ref 19–32)
CREATININE: 2.4 mg/dL — AB (ref 0.50–1.35)
Calcium: 9.9 mg/dL (ref 8.4–10.5)
Chloride: 103 mEq/L (ref 96–112)
GFR, EST AFRICAN AMERICAN: 29 mL/min — AB
GFR, Est Non African American: 25 mL/min — ABNORMAL LOW
Glucose, Bld: 93 mg/dL (ref 70–99)
Potassium: 4.7 mEq/L (ref 3.5–5.3)
Sodium: 137 mEq/L (ref 135–145)

## 2014-07-26 MED ORDER — AMIODARONE HCL 200 MG PO TABS: 200.0000 mg | ORAL_TABLET | Freq: Two times a day (BID) | ORAL | Status: DC

## 2014-07-26 NOTE — Telephone Encounter (Signed)
Pt's wife was returning [name] [name]'s call regarding some test that were ran on [name] [name] this morning. Please call  Thanks

## 2014-07-26 NOTE — Telephone Encounter (Signed)
Returned call to patient's wife bmet results given.Advised to continue lasix 40 mg am and only take 40 mg in pm if needed.Stated husband needed refill for amiodarone.Refill sent to pharmacy.

## 2014-07-29 ENCOUNTER — Other Ambulatory Visit: Payer: Self-pay

## 2014-07-29 MED ORDER — CARVEDILOL 25 MG PO TABS: 25.0000 mg | ORAL_TABLET | Freq: Two times a day (BID) | ORAL | Status: DC

## 2014-07-30 ENCOUNTER — Other Ambulatory Visit: Payer: Self-pay | Admitting: *Deleted

## 2014-07-30 MED ORDER — CARVEDILOL 25 MG PO TABS: 25.0000 mg | ORAL_TABLET | Freq: Two times a day (BID) | ORAL | Status: DC

## 2014-08-03 ENCOUNTER — Encounter: Payer: Self-pay | Admitting: Internal Medicine

## 2014-08-03 ENCOUNTER — Telehealth: Payer: Self-pay

## 2014-08-03 ENCOUNTER — Ambulatory Visit (INDEPENDENT_AMBULATORY_CARE_PROVIDER_SITE_OTHER): Payer: Medicare Other

## 2014-08-03 DIAGNOSIS — I6350 Cerebral infarction due to unspecified occlusion or stenosis of unspecified cerebral artery: Secondary | ICD-10-CM

## 2014-08-03 DIAGNOSIS — I4891 Unspecified atrial fibrillation: Secondary | ICD-10-CM

## 2014-08-03 DIAGNOSIS — Z7901 Long term (current) use of anticoagulants: Secondary | ICD-10-CM

## 2014-08-03 DIAGNOSIS — Z5181 Encounter for therapeutic drug level monitoring: Secondary | ICD-10-CM

## 2014-08-03 LAB — POCT INR: INR: 2.6

## 2014-08-04 NOTE — Telephone Encounter (Signed)
You may refill his alprazolam 0.5 mg with refills x 6  [name] [name] [name] MD, [facility]

## 2014-08-05 MED ORDER — ALPRAZOLAM 0.5 MG PO TABS: ORAL_TABLET | ORAL | Status: DC

## 2014-08-05 NOTE — Telephone Encounter (Signed)
Alprazolam refill phoned in to pharmacy.

## 2014-08-05 NOTE — Addendum Note (Signed)
Addended by: Meda Klinefelter D on: 08/05/2014 03:43 PM   Modules accepted: Orders

## 2014-08-11 ENCOUNTER — Ambulatory Visit (INDEPENDENT_AMBULATORY_CARE_PROVIDER_SITE_OTHER): Payer: Medicare Other | Admitting: *Deleted

## 2014-08-11 ENCOUNTER — Ambulatory Visit (INDEPENDENT_AMBULATORY_CARE_PROVIDER_SITE_OTHER): Payer: Medicare Other | Admitting: Internal Medicine

## 2014-08-11 ENCOUNTER — Encounter: Payer: Self-pay | Admitting: Internal Medicine

## 2014-08-11 VITALS — BP 132/70 | HR 65 | Ht 67.0 in | Wt 155.0 lb

## 2014-08-11 DIAGNOSIS — I2589 Other forms of chronic ischemic heart disease: Secondary | ICD-10-CM

## 2014-08-11 DIAGNOSIS — I4891 Unspecified atrial fibrillation: Secondary | ICD-10-CM

## 2014-08-11 DIAGNOSIS — I5022 Chronic systolic (congestive) heart failure: Secondary | ICD-10-CM

## 2014-08-11 DIAGNOSIS — I482 Chronic atrial fibrillation, unspecified: Secondary | ICD-10-CM

## 2014-08-11 DIAGNOSIS — I4729 Other ventricular tachycardia: Secondary | ICD-10-CM

## 2014-08-11 DIAGNOSIS — I255 Ischemic cardiomyopathy: Secondary | ICD-10-CM

## 2014-08-11 DIAGNOSIS — I472 Ventricular tachycardia, unspecified: Secondary | ICD-10-CM

## 2014-08-11 DIAGNOSIS — Z5181 Encounter for therapeutic drug level monitoring: Secondary | ICD-10-CM

## 2014-08-11 DIAGNOSIS — I6350 Cerebral infarction due to unspecified occlusion or stenosis of unspecified cerebral artery: Secondary | ICD-10-CM

## 2014-08-11 DIAGNOSIS — Z7901 Long term (current) use of anticoagulants: Secondary | ICD-10-CM

## 2014-08-11 DIAGNOSIS — Z95810 Presence of automatic (implantable) cardiac defibrillator: Secondary | ICD-10-CM

## 2014-08-11 LAB — MDC_IDC_ENUM_SESS_TYPE_INCLINIC
Battery Remaining Longevity: 48 mo
Brady Statistic AP VP Percent: 0 %
Brady Statistic AP VS Percent: 0 %
Brady Statistic AS VS Percent: 0.87 %
Brady Statistic RA Percent Paced: 0 %
Brady Statistic RV Percent Paced: 99.17 %
Date Time Interrogation Session: 20150902101139
HIGH POWER IMPEDANCE MEASURED VALUE: 44 Ohm
HighPow Impedance: 228 Ohm
HighPow Impedance: 59 Ohm
Lead Channel Impedance Value: 323 Ohm
Lead Channel Impedance Value: 646 Ohm
Lead Channel Pacing Threshold Pulse Width: 0.4 ms
Lead Channel Pacing Threshold Pulse Width: 0.8 ms
Lead Channel Sensing Intrinsic Amplitude: 1.125 mV
Lead Channel Sensing Intrinsic Amplitude: 11.5 mV
Lead Channel Sensing Intrinsic Amplitude: 14.75 mV
Lead Channel Setting Pacing Amplitude: 2.5 V
Lead Channel Setting Pacing Pulse Width: 0.6 ms
Lead Channel Setting Pacing Pulse Width: 0.8 ms
Lead Channel Setting Sensing Sensitivity: 0.3 mV
MDC IDC MSMT BATTERY VOLTAGE: 2.97 V
MDC IDC MSMT LEADCHNL LV IMPEDANCE VALUE: 779 Ohm
MDC IDC MSMT LEADCHNL LV PACING THRESHOLD AMPLITUDE: 2 V
MDC IDC MSMT LEADCHNL RA IMPEDANCE VALUE: 456 Ohm
MDC IDC MSMT LEADCHNL RA SENSING INTR AMPL: 0.875 mV
MDC IDC MSMT LEADCHNL RV IMPEDANCE VALUE: 665 Ohm
MDC IDC MSMT LEADCHNL RV PACING THRESHOLD AMPLITUDE: 1.375 V
MDC IDC SET LEADCHNL LV PACING AMPLITUDE: 2.75 V
MDC IDC SET ZONE DETECTION INTERVAL: 350 ms
MDC IDC STAT BRADY AS VP PERCENT: 99.13 %
Zone Setting Detection Interval: 320 ms
Zone Setting Detection Interval: 400 ms
Zone Setting Detection Interval: 450 ms

## 2014-08-11 LAB — POCT INR: INR: 2.9

## 2014-08-11 MED ORDER — AMIODARONE HCL 200 MG PO TABS: 200.0000 mg | ORAL_TABLET | Freq: Every day | ORAL | Status: DC

## 2014-08-11 NOTE — Patient Instructions (Addendum)
Your physician has recommended you make the following change in your medication:  1) DECREASE Amiodarone to 200 mg daily (1 tablet)  Remote monitoring is used to monitor your Pacemaker of ICD from home. This monitoring reduces the number of office visits required to check your device to one time per year. It allows [place] to keep an eye on the functioning of your device to ensure it is working properly. You are scheduled for a device check from home on 11/15/14. You may send your transmission at any time that day. If you have a wireless device, the transmission will be sent automatically. After your physician reviews your transmission, you will receive a postcard with your next transmission date.  Your physician wants you to follow-up in: 6 months with Dr. Ladona Ridgel.  You will receive a reminder letter in the mail two months in advance. If you don't receive a letter, please call our office to schedule the follow-up appointment.

## 2014-08-11 NOTE — Assessment & Plan Note (Signed)
He has had no additional VT since being placed on amiodarone. Will follow. Today I asked him to reduce his dose.

## 2014-08-11 NOTE — Assessment & Plan Note (Signed)
His ventricular rate is well controlled. No change.

## 2014-08-11 NOTE — Progress Notes (Signed)
HPI [name] [name] returns today for followup. He is a very pleasant 73 year old man with an ischemic cardiomyopathy, chronic atrial fibrillation, complete heart block, ventricular tachycardia, status post ICD insertion. He underwent generator removal and insertion of a new device back in December. Since then he has been stable. He states today that he feels better than he has in many years. He denies chest pain or shortness of breath. No syncope. No ICD shock. No peripheral edema. He received an ICD shock in July for VF and we started amiodarone.  Allergies  Allergen Reactions  . Nitroglycerin Other (See Comments)    Became severely hypotensive  . Other     Intolerance to ACE inhibitors     Current Outpatient Prescriptions  Medication Sig Dispense Refill  . allopurinol (ZYLOPRIM) 100 MG tablet Take 1 tablet (100 mg total) by mouth daily.  90 tablet  1  . ALPRAZolam (XANAX) 0.5 MG tablet Take 0.5 mg three times a day as needed  90 tablet  0  . amiodarone (PACERONE) 200 MG tablet Take 1 tablet (200 mg total) by mouth 2 (two) times daily.  60 tablet  6  . calcitRIOL (ROCALTROL) 0.25 MCG capsule Take 0.25 mcg by mouth daily.       . carvedilol (COREG) 25 MG tablet Take 1 tablet (25 mg total) by mouth 2 (two) times daily.  180 tablet  0  . digoxin (LANOXIN) 0.125 MG tablet Take 1/2 tablet daily  45 tablet  1  . furosemide (LASIX) 40 MG tablet Take 40 mg by mouth daily. Take 1 tablets in the morning and 1 tablet in the evening prn      . lovastatin (MEVACOR) 40 MG tablet Take 1 tablet (40 mg total) by mouth at bedtime.  90 tablet  1  . Multiple Vitamins-Minerals (PRESERVISION AREDS 2) CAPS Take 1 capsule by mouth 2 (two) times daily.      . potassium chloride (K-DUR) 10 MEQ tablet Take 1 tablet (10 mEq total) by mouth daily.  30 tablet  6  . vitamin B-12 (CYANOCOBALAMIN) 100 MCG tablet Take 50 mcg by mouth daily.      Marland Kitchen warfarin (COUMADIN) 1 MG tablet 1 tablet every day or as directed by coumadin  clinic  100 tablet  1   No current facility-administered medications for this visit.     Past Medical History  Diagnosis Date  . Coronary artery disease   . Cardiomyopathy, ischemic     EF of 30-35%  . Status post CVA   . VT (ventricular tachycardia) aug of 2010    removed old ICD and replaced with biventricular ICD  . CKD (chronic kidney disease) stage 3, GFR 30-59 ml/min   . Atrial fibrillation     chronic  . Hypertension   . Dyslipidemia   . Peripheral vascular disease   . Thyroid disease     hypo,due to amiodarone therapy,resolved  . Myocardial infarction January 2001     History of anteroseptal myocardial infarction presenting with left bundle-branch block  . Anxiety   . Tobacco abuse     History of 2 pack per day tobacco habituation  . Pacemaker   . CHF (congestive heart failure)     ROS:   All systems reviewed and negative except as noted in the HPI.   Past Surgical History  Procedure Laterality Date  . Cholecystectomy    . Femoral bypass      fem-fem bpg, right fem pop bpg  . Icd  07/15/2009    implant Dr. Ladona Ridgel  . Coronary artery bypass graft  2001    x 4  . Cardiac catheterization  2001    PTCA  . Cardiac catheterization  2005    patent grafts  . Cataracts  2006    right eye     Family History  Problem Relation Age of Onset  . Heart disease Mother   . Aneurysm Brother   . Stroke Mother   . Stroke Father      History   Social History  . Marital Status: Married    Spouse Name: N/A    Number of Children: 2  . Years of Education: N/A   Occupational History  . Psychologist, prison and probation services    Social History Main Topics  . Smoking status: Former Smoker    Quit date: 09/30/2000  . Smokeless tobacco: Not on file  . Alcohol Use:   . Drug Use:   . Sexual Activity:    Other Topics Concern  . Not on file   Social History Narrative  . No narrative on file     BP 132/70  Pulse 65  Ht  (1.702 m)  Wt 155 lb (70.308 kg)  BMI 24.27 kg/m2   SpO2 90%  Physical Exam:  Well appearing 73 year old man,NAD HEENT: Unremarkable Neck:  No JVD, no thyromegally Lungs:  Clear with no wheezes, rales, or rhonchi. Well-healed ICD incision. HEART:  Regular rate rhythm, no murmurs, no rubs, no clicks Abd:  soft, positive bowel sounds, no organomegally, no rebound, no guarding Ext:  2 plus pulses, no edema, no cyanosis, no clubbing Skin:  No rashes no nodules Neuro:  CN II through XII intact, motor grossly intact   DEVICE  Normal device function.  See PaceArt for details.   Assess/Plan:

## 2014-08-11 NOTE — Assessment & Plan Note (Signed)
He denies chest pain or other anginal symptoms. Will follow.

## 2014-08-16 ENCOUNTER — Encounter: Payer: Self-pay | Admitting: Internal Medicine

## 2014-08-23 ENCOUNTER — Other Ambulatory Visit: Payer: Self-pay | Admitting: Internal Medicine

## 2014-08-25 ENCOUNTER — Ambulatory Visit (INDEPENDENT_AMBULATORY_CARE_PROVIDER_SITE_OTHER): Payer: Medicare Other | Admitting: *Deleted

## 2014-08-25 DIAGNOSIS — Z5181 Encounter for therapeutic drug level monitoring: Secondary | ICD-10-CM

## 2014-08-25 DIAGNOSIS — I6350 Cerebral infarction due to unspecified occlusion or stenosis of unspecified cerebral artery: Secondary | ICD-10-CM

## 2014-08-25 DIAGNOSIS — Z7901 Long term (current) use of anticoagulants: Secondary | ICD-10-CM

## 2014-08-25 DIAGNOSIS — I4891 Unspecified atrial fibrillation: Secondary | ICD-10-CM

## 2014-08-25 LAB — POCT INR: INR: 2.8

## 2014-09-01 ENCOUNTER — Telehealth: Payer: Self-pay

## 2014-09-01 NOTE — Telephone Encounter (Signed)
OK to refill xanax with 5 refills.  Kalem Rockwell [name] MD, [facility]

## 2014-09-02 MED ORDER — ALPRAZOLAM 0.5 MG PO TABS: ORAL_TABLET | ORAL | Status: DC

## 2014-09-02 NOTE — Telephone Encounter (Signed)
Spoke to patient's wife xanax refill sent to pharmacy.

## 2014-09-02 NOTE — Addendum Note (Signed)
Addended by: Meda Klinefelter D on: 09/02/2014 08:40 AM   Modules accepted: Orders

## 2014-09-08 ENCOUNTER — Ambulatory Visit (INDEPENDENT_AMBULATORY_CARE_PROVIDER_SITE_OTHER): Payer: Medicare Other | Admitting: Pharmacist

## 2014-09-08 DIAGNOSIS — I4891 Unspecified atrial fibrillation: Secondary | ICD-10-CM

## 2014-09-08 DIAGNOSIS — I6350 Cerebral infarction due to unspecified occlusion or stenosis of unspecified cerebral artery: Secondary | ICD-10-CM

## 2014-09-08 DIAGNOSIS — Z7901 Long term (current) use of anticoagulants: Secondary | ICD-10-CM

## 2014-09-08 DIAGNOSIS — Z5181 Encounter for therapeutic drug level monitoring: Secondary | ICD-10-CM

## 2014-09-08 LAB — POCT INR: INR: 3.2

## 2014-09-29 ENCOUNTER — Ambulatory Visit (INDEPENDENT_AMBULATORY_CARE_PROVIDER_SITE_OTHER): Payer: Medicare Other | Admitting: *Deleted

## 2014-09-29 DIAGNOSIS — I6350 Cerebral infarction due to unspecified occlusion or stenosis of unspecified cerebral artery: Secondary | ICD-10-CM

## 2014-09-29 DIAGNOSIS — Z5181 Encounter for therapeutic drug level monitoring: Secondary | ICD-10-CM

## 2014-09-29 DIAGNOSIS — I639 Cerebral infarction, unspecified: Secondary | ICD-10-CM

## 2014-09-29 DIAGNOSIS — Z7901 Long term (current) use of anticoagulants: Secondary | ICD-10-CM

## 2014-09-29 DIAGNOSIS — I4891 Unspecified atrial fibrillation: Secondary | ICD-10-CM

## 2014-09-29 LAB — POCT INR: INR: 4.4

## 2014-10-06 ENCOUNTER — Ambulatory Visit: Payer: Medicare Other | Admitting: Cardiology

## 2014-10-14 ENCOUNTER — Ambulatory Visit (INDEPENDENT_AMBULATORY_CARE_PROVIDER_SITE_OTHER): Payer: Medicare Other

## 2014-10-14 DIAGNOSIS — Z5181 Encounter for therapeutic drug level monitoring: Secondary | ICD-10-CM

## 2014-10-14 DIAGNOSIS — I639 Cerebral infarction, unspecified: Secondary | ICD-10-CM

## 2014-10-14 DIAGNOSIS — I4891 Unspecified atrial fibrillation: Secondary | ICD-10-CM

## 2014-10-14 DIAGNOSIS — I6350 Cerebral infarction due to unspecified occlusion or stenosis of unspecified cerebral artery: Secondary | ICD-10-CM

## 2014-10-14 DIAGNOSIS — Z7901 Long term (current) use of anticoagulants: Secondary | ICD-10-CM

## 2014-10-14 LAB — POCT INR: INR: 3.2

## 2014-11-01 ENCOUNTER — Other Ambulatory Visit: Payer: Self-pay

## 2014-11-01 MED ORDER — CARVEDILOL 25 MG PO TABS
25.0000 mg | ORAL_TABLET | Freq: Two times a day (BID) | ORAL | Status: DC
Start: 2014-11-01 — End: 2015-03-02

## 2014-11-08 ENCOUNTER — Other Ambulatory Visit: Payer: Self-pay | Admitting: Cardiology

## 2014-11-15 ENCOUNTER — Encounter: Payer: Medicare Other | Admitting: *Deleted

## 2014-11-15 ENCOUNTER — Telehealth: Payer: Self-pay | Admitting: Cardiology

## 2014-11-15 ENCOUNTER — Telehealth: Payer: Self-pay | Admitting: *Deleted

## 2014-11-15 NOTE — Telephone Encounter (Signed)
Patients wife requests [facility]. Thanks, MI

## 2014-11-15 NOTE — Telephone Encounter (Signed)
Spoke with pt and reminded pt of remote transmission that is due today. Pt verbalized understanding.   

## 2014-11-15 NOTE — Telephone Encounter (Signed)
wirex ordered for pt. Pt aware.

## 2014-11-16 ENCOUNTER — Encounter: Payer: Self-pay | Admitting: Cardiology

## 2014-11-16 MED ORDER — ALPRAZOLAM 0.5 MG PO TABS: ORAL_TABLET | ORAL | Status: DC

## 2014-11-16 NOTE — Telephone Encounter (Signed)
Returned call to patient no answer.Left message on personal voice mail alprazolam refill sent to pharmacy.

## 2014-11-17 ENCOUNTER — Ambulatory Visit (INDEPENDENT_AMBULATORY_CARE_PROVIDER_SITE_OTHER): Payer: Medicare Other | Admitting: *Deleted

## 2014-11-17 DIAGNOSIS — I639 Cerebral infarction, unspecified: Secondary | ICD-10-CM

## 2014-11-17 DIAGNOSIS — Z5181 Encounter for therapeutic drug level monitoring: Secondary | ICD-10-CM

## 2014-11-17 DIAGNOSIS — I6350 Cerebral infarction due to unspecified occlusion or stenosis of unspecified cerebral artery: Secondary | ICD-10-CM

## 2014-11-17 DIAGNOSIS — I4891 Unspecified atrial fibrillation: Secondary | ICD-10-CM

## 2014-11-17 DIAGNOSIS — Z7901 Long term (current) use of anticoagulants: Secondary | ICD-10-CM

## 2014-11-17 LAB — POCT INR: INR: 4.7

## 2014-11-18 ENCOUNTER — Encounter (HOSPITAL_COMMUNITY): Payer: Self-pay | Admitting: Internal Medicine

## 2014-11-29 ENCOUNTER — Telehealth: Payer: Self-pay | Admitting: Internal Medicine

## 2014-11-29 NOTE — Telephone Encounter (Signed)
New Msg     Pt wife calling, not sure if they have device hooked up correctly since they have received new parts    Please call [phone].

## 2014-11-29 NOTE — Telephone Encounter (Signed)
LMOVM for pt wife to return call.  

## 2014-11-29 NOTE — Telephone Encounter (Signed)
LMOVM pt wife to return call.

## 2014-11-30 ENCOUNTER — Telehealth: Payer: Self-pay | Admitting: Cardiology

## 2014-11-30 ENCOUNTER — Ambulatory Visit (INDEPENDENT_AMBULATORY_CARE_PROVIDER_SITE_OTHER): Payer: Medicare Other | Admitting: Pharmacist

## 2014-11-30 DIAGNOSIS — I4891 Unspecified atrial fibrillation: Secondary | ICD-10-CM

## 2014-11-30 DIAGNOSIS — Z5181 Encounter for therapeutic drug level monitoring: Secondary | ICD-10-CM

## 2014-11-30 DIAGNOSIS — I6350 Cerebral infarction due to unspecified occlusion or stenosis of unspecified cerebral artery: Secondary | ICD-10-CM

## 2014-11-30 DIAGNOSIS — Z7901 Long term (current) use of anticoagulants: Secondary | ICD-10-CM

## 2014-11-30 DIAGNOSIS — I639 Cerebral infarction, unspecified: Secondary | ICD-10-CM

## 2014-11-30 LAB — POCT INR: INR: 3.8

## 2014-11-30 NOTE — Telephone Encounter (Signed)
3rd attempt. LMOVM for pt wife to return call. Message will be closed due to 3rd attempt.

## 2014-11-30 NOTE — Telephone Encounter (Signed)
Instructed pt wife how to set up wriex w/ home monitor. Pt wife verbalized understanding. I walked her through a manual transmission but transmission was not successful. Instructed pt wife to call tech services. Pt wife verbalized understanding.

## 2014-12-20 ENCOUNTER — Encounter: Payer: Self-pay | Admitting: Cardiology

## 2014-12-20 ENCOUNTER — Ambulatory Visit (INDEPENDENT_AMBULATORY_CARE_PROVIDER_SITE_OTHER): Payer: Medicare Other | Admitting: Pharmacist Clinician (PhC)/ Clinical Pharmacy Specialist

## 2014-12-20 ENCOUNTER — Ambulatory Visit (INDEPENDENT_AMBULATORY_CARE_PROVIDER_SITE_OTHER): Payer: Medicare Other | Admitting: Cardiology

## 2014-12-20 ENCOUNTER — Ambulatory Visit: Payer: Medicare Other | Admitting: Pharmacist Clinician (PhC)/ Clinical Pharmacy Specialist

## 2014-12-20 VITALS — BP 90/65 | HR 58 | Ht 64.0 in | Wt 160.5 lb

## 2014-12-20 DIAGNOSIS — Z7901 Long term (current) use of anticoagulants: Secondary | ICD-10-CM

## 2014-12-20 DIAGNOSIS — I482 Chronic atrial fibrillation, unspecified: Secondary | ICD-10-CM

## 2014-12-20 DIAGNOSIS — I5022 Chronic systolic (congestive) heart failure: Secondary | ICD-10-CM

## 2014-12-20 DIAGNOSIS — I4891 Unspecified atrial fibrillation: Secondary | ICD-10-CM

## 2014-12-20 DIAGNOSIS — I639 Cerebral infarction, unspecified: Secondary | ICD-10-CM

## 2014-12-20 DIAGNOSIS — Z951 Presence of aortocoronary bypass graft: Secondary | ICD-10-CM

## 2014-12-20 DIAGNOSIS — I472 Ventricular tachycardia, unspecified: Secondary | ICD-10-CM

## 2014-12-20 DIAGNOSIS — I25810 Atherosclerosis of coronary artery bypass graft(s) without angina pectoris: Secondary | ICD-10-CM

## 2014-12-20 DIAGNOSIS — I6350 Cerebral infarction due to unspecified occlusion or stenosis of unspecified cerebral artery: Secondary | ICD-10-CM

## 2014-12-20 DIAGNOSIS — Z5181 Encounter for therapeutic drug level monitoring: Secondary | ICD-10-CM

## 2014-12-20 LAB — POCT INR: INR: 3.9

## 2014-12-20 NOTE — Progress Notes (Signed)
[name] [name] Date of Birth: 09-Aug-1941 Medical Record #[id number]  History of Present Illness: [name] [name]  is seen today for followup CHF.  He has a history of coronary disease and is status post CABG in 2001. This included an LIMA graft to the LAD, saphenous vein graft to the first obtuse marginal vessel, and saphenous vein graft to the PDA. Cardiac catheterization in 2005 showed patent grafts. He has a history of chronic atrial fibrillation. He also has a history of ventricular tachycardia and has a biventricular ICD. This was revised in December 2013. He has ischemic cardiomyopathy with ejection fraction of 20-25% by Echo in June 2015. He does have chronic kidney disease and is followed by nephrology. In July 2015 he received an ICD shock and was started on amiodarone. His dose was reduced when seen by Dr. Ladona Ridgel in September.  On followup today he states he is doing very well. He remains active. He has no significant swelling or chest pain. He does note some fatigue and SOB about 2 pm but no orthopnea or PND. No palpitations or ICD discharges.   Current Outpatient Prescriptions on File Prior to Visit  Medication Sig Dispense Refill  . allopurinol (ZYLOPRIM) 100 MG tablet TAKE 1 BY MOUTH DAILY 90 tablet 0  . ALPRAZolam (XANAX) 0.5 MG tablet Take 0.5 mg three times a day as needed 90 tablet 3  . amiodarone (PACERONE) 200 MG tablet Take 1 tablet (200 mg total) by mouth daily. 30 tablet 6  . calcitRIOL (ROCALTROL) 0.25 MCG capsule Take 0.25 mcg by mouth daily.     . carvedilol (COREG) 25 MG tablet Take 1 tablet (25 mg total) by mouth 2 (two) times daily. 180 tablet 0  . digoxin (LANOXIN) 0.125 MG tablet Take 1/2 tablet daily 45 tablet 1  . furosemide (LASIX) 40 MG tablet Take 40 mg by mouth daily. Take 1 tablets in the morning and 1 tablet in the evening prn    . lovastatin (MEVACOR) 40 MG tablet Take 1 tablet (40 mg total) by mouth at bedtime. 90 tablet 1  . Multiple Vitamins-Minerals  (PRESERVISION AREDS 2) CAPS Take 1 capsule by mouth 2 (two) times daily.    . potassium chloride (K-DUR) 10 MEQ tablet Take 1 tablet (10 mEq total) by mouth daily. 30 tablet 6  . vitamin B-12 (CYANOCOBALAMIN) 100 MCG tablet Take 50 mcg by mouth daily.    Marland Kitchen warfarin (COUMADIN) 1 MG tablet 1 tablet every day or as directed by coumadin clinic 100 tablet 1   No current facility-administered medications on file prior to visit.    Allergies  Allergen Reactions  . Nitroglycerin Other (See Comments)    Became severely hypotensive  . Other     Intolerance to ACE inhibitors    Past Medical History  Diagnosis Date  . Coronary artery disease   . Cardiomyopathy, ischemic     EF of 30-35%  . Status post CVA   . VT (ventricular tachycardia) aug of 2010    removed old ICD and replaced with biventricular ICD  . CKD (chronic kidney disease) stage 3, GFR 30-59 ml/min   . Atrial fibrillation     chronic  . Hypertension   . Dyslipidemia   . Peripheral vascular disease   . Thyroid disease     hypo,due to amiodarone therapy,resolved  . Myocardial infarction January 2001     History of anteroseptal myocardial infarction presenting with left bundle-branch block  . Anxiety   . Tobacco abuse  History of 2 pack per day tobacco habituation  . Pacemaker   . CHF (congestive heart failure)     Past Surgical History  Procedure Laterality Date  . Cholecystectomy    . Femoral bypass      fem-fem bpg, right fem pop bpg  . Icd  07/15/2009    implant Dr. Ladona Ridgel  . Coronary artery bypass graft  2001    x 4  . Cardiac catheterization  2001    PTCA  . Cardiac catheterization  2005    patent grafts  . Cataracts  2006    right eye  . Implantable cardioverter defibrillator (icd) generator change N/A 11/28/2012    Procedure: ICD GENERATOR CHANGE;  Surgeon: Marinus Maw, MD;  Location: [facility] CATH LAB;  Service: Cardiovascular;  Laterality: N/A;    History  Smoking status  . Former Smoker  . Quit  date: 09/30/2000  Smokeless tobacco  . Not on file    History  Alcohol Use: Not on file    Family History  Problem Relation Age of Onset  . Heart disease Mother   . Aneurysm Brother   . Stroke Mother   . Stroke Father     Review of Systems: As noted in history of present illness. All other systems were reviewed and are negative.  Physical Exam: BP 90/65 mmHg  Pulse 58  Ht 5\' 4"  (1.626 m)  Wt 160 lb 8 oz (72.802 kg)  BMI 27.54 kg/m2 He is a pleasant white male who appears chronically ill. HEENT exam is unremarkable. Neck is supple no JVD, adenopathy, thyromegaly, or bruits. Lungs are clear. His ICD site is stable. Cardiac exam reveals a regular rate and rhythm without gallop, murmur, or click. Abdomen is soft and nontender. There are no masses or bruits. Extremities are without edema. Pedal pulses are good. Skin is warm and dry. He is alert and oriented x3. Cranial nerves II through XII are intact.  LABORATORY DATA: Echo 05/10/14: Study Conclusions  - Left ventricle: Multiple RWMA;s including septum, apex anterior wall and mid and distal inferior wall. The cavity size was severely dilated. Wall thickness was normal. Systolic function was severely reduced. The estimated ejection fraction was in the range of 20% to 25%. - Left atrium: The atrium was mildly dilated. - Atrial septum: No defect or patent foramen ovale was identified.   Assessment / Plan: 1. Chronic systolic congestive heart failure. EF 20-25%.  He is well compensated. We will continue with his current medication including carvedilol, digoxin, and Lasix. Avoid ACE inhibitor/ARB due to to renal insufficiency. I will check Dig level, BNP, and BMET today. His BP does run low but I am reluctant to reduce his beta blocker with his VT.  He s BiV paced.   2. History of ventricular tachycardia. Status post biventricular ICD. ICD shock in July 2015. Now on amiodarone. Will check TSH. No recurrent shocks on  amiodarone.  3. Chronic atrial fibrillation. He is on anticoagulation with therapeutic INRs. Rate is very well controlled. INR 3.9 today and dose adjusted.  4. Remote CVA.  5. Chronic kidney disease stage III. Stable.  6. Coronary disease status post CABG. No recurrent angina.

## 2014-12-20 NOTE — Patient Instructions (Addendum)
Continue your current therapy  We will check some blood work today  I will see you in 6 months.

## 2014-12-21 LAB — BASIC METABOLIC PANEL
BUN: 34 mg/dL — ABNORMAL HIGH (ref 6–23)
CO2: 25 meq/L (ref 19–32)
Calcium: 9.6 mg/dL (ref 8.4–10.5)
Chloride: 106 mEq/L (ref 96–112)
Creat: 2.4 mg/dL — ABNORMAL HIGH (ref 0.50–1.35)
GLUCOSE: 69 mg/dL — AB (ref 70–99)
POTASSIUM: 4.5 meq/L (ref 3.5–5.3)
Sodium: 140 mEq/L (ref 135–145)

## 2014-12-28 ENCOUNTER — Telehealth: Payer: Self-pay | Admitting: Cardiology

## 2014-12-28 NOTE — Telephone Encounter (Signed)
Returned call to patient's wife Dr.Jordan advised ok for patient to get Prevnar 13.

## 2014-12-28 NOTE — Telephone Encounter (Signed)
She says [facility] has the pneumonia shot. They said they give him one there,if Dr [name] says it is okay.They will do it when he get his Coumadin checked on 1-26.

## 2015-01-04 ENCOUNTER — Ambulatory Visit (INDEPENDENT_AMBULATORY_CARE_PROVIDER_SITE_OTHER): Payer: Medicare Other | Admitting: *Deleted

## 2015-01-04 DIAGNOSIS — I4891 Unspecified atrial fibrillation: Secondary | ICD-10-CM

## 2015-01-04 DIAGNOSIS — I6350 Cerebral infarction due to unspecified occlusion or stenosis of unspecified cerebral artery: Secondary | ICD-10-CM

## 2015-01-04 DIAGNOSIS — I482 Chronic atrial fibrillation, unspecified: Secondary | ICD-10-CM

## 2015-01-04 DIAGNOSIS — Z5181 Encounter for therapeutic drug level monitoring: Secondary | ICD-10-CM

## 2015-01-04 DIAGNOSIS — I639 Cerebral infarction, unspecified: Secondary | ICD-10-CM

## 2015-01-04 DIAGNOSIS — Z7901 Long term (current) use of anticoagulants: Secondary | ICD-10-CM

## 2015-01-04 LAB — POCT INR: INR: 2.6

## 2015-01-17 ENCOUNTER — Other Ambulatory Visit: Payer: Self-pay | Admitting: Cardiology

## 2015-01-18 ENCOUNTER — Ambulatory Visit (INDEPENDENT_AMBULATORY_CARE_PROVIDER_SITE_OTHER): Payer: Medicare Other | Admitting: *Deleted

## 2015-01-18 DIAGNOSIS — I4891 Unspecified atrial fibrillation: Secondary | ICD-10-CM

## 2015-01-18 DIAGNOSIS — I639 Cerebral infarction, unspecified: Secondary | ICD-10-CM

## 2015-01-18 DIAGNOSIS — I482 Chronic atrial fibrillation, unspecified: Secondary | ICD-10-CM

## 2015-01-18 DIAGNOSIS — I6350 Cerebral infarction due to unspecified occlusion or stenosis of unspecified cerebral artery: Secondary | ICD-10-CM

## 2015-01-18 DIAGNOSIS — Z5181 Encounter for therapeutic drug level monitoring: Secondary | ICD-10-CM

## 2015-01-18 DIAGNOSIS — Z7901 Long term (current) use of anticoagulants: Secondary | ICD-10-CM

## 2015-01-18 LAB — POCT INR: INR: 2.8

## 2015-01-19 ENCOUNTER — Other Ambulatory Visit: Payer: Self-pay | Admitting: *Deleted

## 2015-01-19 ENCOUNTER — Telehealth: Payer: Self-pay | Admitting: Cardiology

## 2015-01-19 MED ORDER — POTASSIUM CHLORIDE ER 10 MEQ PO TBCR: 10.0000 meq | EXTENDED_RELEASE_TABLET | Freq: Every day | ORAL | Status: DC

## 2015-01-19 NOTE — Telephone Encounter (Signed)
°  1. Which medications need to be refilled? potassium   2. Which pharmacy is medication [facility] [place] [facility] [place]   3. Do they need a 30 day or 90 day supply?  90  4. Would they like a call back once the medication has been sent to the pharmacy? Yes

## 2015-01-19 NOTE — Telephone Encounter (Signed)
Potassium refilled

## 2015-01-20 ENCOUNTER — Other Ambulatory Visit: Payer: Self-pay

## 2015-01-26 ENCOUNTER — Telehealth: Payer: Self-pay | Admitting: Cardiology

## 2015-01-26 NOTE — Telephone Encounter (Signed)
[name] [name] is calling in stating that a prior authorization is needed to refill the pt's Alprazolam prescription for 90 days. She states that she is going to pick up a his prescription for 20 pills but once Dr. [name] sends in the PA he will be refunded that money paid for the 20 pills. Please f/u  thanks

## 2015-01-31 NOTE — Telephone Encounter (Signed)
Patient called spoke to daughter insurance will no longer cover alprazolam.Advised will need to call PCP for a different medication.

## 2015-01-31 NOTE — Telephone Encounter (Signed)
Received call from patient's daughter she stated fathers insurance will not cover any medication like alprazolam.Stated he will continue alprazolam and pay out of pocket for it.

## 2015-02-01 ENCOUNTER — Encounter: Payer: Medicare Other | Admitting: Internal Medicine

## 2015-02-02 ENCOUNTER — Telehealth: Payer: Self-pay | Admitting: Cardiology

## 2015-02-02 ENCOUNTER — Ambulatory Visit (INDEPENDENT_AMBULATORY_CARE_PROVIDER_SITE_OTHER): Payer: Medicare Other | Admitting: *Deleted

## 2015-02-02 DIAGNOSIS — Z5181 Encounter for therapeutic drug level monitoring: Secondary | ICD-10-CM

## 2015-02-02 DIAGNOSIS — I482 Chronic atrial fibrillation, unspecified: Secondary | ICD-10-CM

## 2015-02-02 DIAGNOSIS — Z7901 Long term (current) use of anticoagulants: Secondary | ICD-10-CM

## 2015-02-02 DIAGNOSIS — I6350 Cerebral infarction due to unspecified occlusion or stenosis of unspecified cerebral artery: Secondary | ICD-10-CM

## 2015-02-02 DIAGNOSIS — I639 Cerebral infarction, unspecified: Secondary | ICD-10-CM

## 2015-02-02 DIAGNOSIS — I4891 Unspecified atrial fibrillation: Secondary | ICD-10-CM

## 2015-02-02 LAB — POCT INR: INR: 3.4

## 2015-02-02 NOTE — Telephone Encounter (Signed)
Calling because the non-formulary request for Alprazolam has been denied.

## 2015-02-03 NOTE — Telephone Encounter (Signed)
Patient's daughter [name] [name] called no answer.Left message on personal voice mail patient's insurance will not cover alprazolam.

## 2015-02-10 ENCOUNTER — Ambulatory Visit (INDEPENDENT_AMBULATORY_CARE_PROVIDER_SITE_OTHER): Payer: Medicare Other | Admitting: Pharmacist

## 2015-02-10 ENCOUNTER — Ambulatory Visit (INDEPENDENT_AMBULATORY_CARE_PROVIDER_SITE_OTHER): Payer: Medicare Other | Admitting: Internal Medicine

## 2015-02-10 ENCOUNTER — Encounter: Payer: Self-pay | Admitting: Internal Medicine

## 2015-02-10 VITALS — BP 102/70 | HR 61 | Ht 64.0 in | Wt 155.0 lb

## 2015-02-10 DIAGNOSIS — I482 Chronic atrial fibrillation, unspecified: Secondary | ICD-10-CM

## 2015-02-10 DIAGNOSIS — I10 Essential (primary) hypertension: Secondary | ICD-10-CM

## 2015-02-10 DIAGNOSIS — Z95810 Presence of automatic (implantable) cardiac defibrillator: Secondary | ICD-10-CM

## 2015-02-10 DIAGNOSIS — I472 Ventricular tachycardia, unspecified: Secondary | ICD-10-CM

## 2015-02-10 DIAGNOSIS — I5022 Chronic systolic (congestive) heart failure: Secondary | ICD-10-CM

## 2015-02-10 DIAGNOSIS — I639 Cerebral infarction, unspecified: Secondary | ICD-10-CM

## 2015-02-10 DIAGNOSIS — I6350 Cerebral infarction due to unspecified occlusion or stenosis of unspecified cerebral artery: Secondary | ICD-10-CM

## 2015-02-10 DIAGNOSIS — I255 Ischemic cardiomyopathy: Secondary | ICD-10-CM | POA: Diagnosis not present

## 2015-02-10 DIAGNOSIS — I4891 Unspecified atrial fibrillation: Secondary | ICD-10-CM

## 2015-02-10 DIAGNOSIS — I4729 Other ventricular tachycardia: Secondary | ICD-10-CM

## 2015-02-10 DIAGNOSIS — Z7901 Long term (current) use of anticoagulants: Secondary | ICD-10-CM

## 2015-02-10 DIAGNOSIS — Z5181 Encounter for therapeutic drug level monitoring: Secondary | ICD-10-CM

## 2015-02-10 LAB — MDC_IDC_ENUM_SESS_TYPE_INCLINIC
Battery Voltage: 2.96 V
Brady Statistic AP VP Percent: 0 %
Brady Statistic AS VP Percent: 99.81 %
Brady Statistic RA Percent Paced: 0 %
Brady Statistic RV Percent Paced: 99.84 %
Date Time Interrogation Session: 20160303103117
HIGH POWER IMPEDANCE MEASURED VALUE: 40 Ohm
HighPow Impedance: 228 Ohm
HighPow Impedance: 55 Ohm
Lead Channel Impedance Value: 760 Ohm
Lead Channel Pacing Threshold Amplitude: 1 V
Lead Channel Pacing Threshold Amplitude: 2.375 V
Lead Channel Pacing Threshold Pulse Width: 0.4 ms
Lead Channel Sensing Intrinsic Amplitude: 0.75 mV
Lead Channel Sensing Intrinsic Amplitude: 13 mV
Lead Channel Setting Pacing Amplitude: 2.75 V
Lead Channel Setting Pacing Amplitude: 3.5 V
Lead Channel Setting Pacing Pulse Width: 0.8 ms
Lead Channel Setting Sensing Sensitivity: 0.3 mV
MDC IDC MSMT BATTERY REMAINING LONGEVITY: 37 mo
MDC IDC MSMT LEADCHNL LV IMPEDANCE VALUE: 323 Ohm
MDC IDC MSMT LEADCHNL LV IMPEDANCE VALUE: 646 Ohm
MDC IDC MSMT LEADCHNL LV PACING THRESHOLD PULSEWIDTH: 0.8 ms
MDC IDC MSMT LEADCHNL RA IMPEDANCE VALUE: 437 Ohm
MDC IDC MSMT LEADCHNL RA PACING THRESHOLD PULSEWIDTH: 0.4 ms
MDC IDC MSMT LEADCHNL RV IMPEDANCE VALUE: 665 Ohm
MDC IDC MSMT LEADCHNL RV PACING THRESHOLD AMPLITUDE: 1 V
MDC IDC SET LEADCHNL RA PACING AMPLITUDE: 2 V
MDC IDC SET LEADCHNL RV PACING PULSEWIDTH: 0.4 ms
MDC IDC SET ZONE DETECTION INTERVAL: 350 ms
MDC IDC SET ZONE DETECTION INTERVAL: 450 ms
MDC IDC STAT BRADY AP VS PERCENT: 0 %
MDC IDC STAT BRADY AS VS PERCENT: 0.19 %
Zone Setting Detection Interval: 320 ms
Zone Setting Detection Interval: 400 ms

## 2015-02-10 LAB — POCT INR: INR: 2.9

## 2015-02-10 NOTE — Assessment & Plan Note (Signed)
He has had no recurrent symptoms and will continue his oral amiodarone.

## 2015-02-10 NOTE — Assessment & Plan Note (Signed)
His symptoms remain class 2. He is encouraged to increase his physical activity. Will follow.

## 2015-02-10 NOTE — Progress Notes (Signed)
HPI Mr. Studley returns today for followup. He is a very pleasant 74 year old man with an ischemic cardiomyopathy, chronic atrial fibrillation, complete heart block, ventricular tachycardia, status post ICD insertion.  Since then he has been stable. He states today that he feels better than he has in many years. He denies chest pain or shortness of breath. No syncope. No ICD shock. No peripheral edema. He has been on amiodarone for VT for over a year and has reverted back to NSR.  He has not felt any better or worse. He admits to some dietary indiscretion. Allergies  Allergen Reactions  . Nitroglycerin Other (See Comments)    Became severely hypotensive  . Other     Intolerance to ACE inhibitors UNKNOWN REACTION     Current Outpatient Prescriptions  Medication Sig Dispense Refill  . allopurinol (ZYLOPRIM) 100 MG tablet TAKE 1 BY MOUTH DAILY (Patient taking differently: TAKE 1 TABLET BY MOUTH DAILY) 90 tablet 0  . ALPRAZolam (XANAX) 0.5 MG tablet Take 0.5 mg three times a day as needed (Patient taking differently: Take 0.5 mg three times a day as needed for anxiety) 90 tablet 3  . amiodarone (PACERONE) 200 MG tablet Take 1 tablet (200 mg total) by mouth daily. 30 tablet 6  . calcitRIOL (ROCALTROL) 0.25 MCG capsule Take 0.25 mcg by mouth daily.     . carvedilol (COREG) 25 MG tablet Take 1 tablet (25 mg total) by mouth 2 (two) times daily. 180 tablet 0  . digoxin (LANOXIN) 0.125 MG tablet Take 1/2 tablet daily (Patient taking differently: Take 1/2 tablet by mouth daily) 45 tablet 1  . furosemide (LASIX) 40 MG tablet Take 40 mg by mouth daily. Take 1 tablets in the morning and 1 tablet in the evening prn    . lovastatin (MEVACOR) 40 MG tablet TAKE 1 BY MOUTH AT BEDTIME (Patient taking differently: TAKE 1 TABLET BY MOUTH AT BEDTIME) 90 tablet 3  . Multiple Vitamins-Minerals (PRESERVISION AREDS 2) CAPS Take 1 capsule by mouth 2 (two) times daily.    . potassium chloride (K-DUR) 10 MEQ tablet Take 1  tablet (10 mEq total) by mouth daily. 90 tablet 3  . vitamin B-12 (CYANOCOBALAMIN) 100 MCG tablet Take 50 mcg by mouth daily.    Marland Kitchen warfarin (COUMADIN) 1 MG tablet 1 tablet every day or as directed by coumadin clinic 100 tablet 1   No current facility-administered medications for this visit.     Past Medical History  Diagnosis Date  . Coronary artery disease   . Cardiomyopathy, ischemic     EF of 30-35%  . Status post CVA   . VT (ventricular tachycardia) aug of 2010    removed old ICD and replaced with biventricular ICD  . CKD (chronic kidney disease) stage 3, GFR 30-59 ml/min   . Atrial fibrillation     chronic  . Hypertension   . Dyslipidemia   . Peripheral vascular disease   . Thyroid disease     hypo,due to amiodarone therapy,resolved  . Myocardial infarction January 2001     History of anteroseptal myocardial infarction presenting with left bundle-branch block  . Anxiety   . Tobacco abuse     History of 2 pack per day tobacco habituation  . Pacemaker   . CHF (congestive heart failure)     ROS:   All systems reviewed and negative except as noted in the HPI.   Past Surgical History  Procedure Laterality Date  . Cholecystectomy    . Femoral bypass  fem-fem bpg, right fem pop bpg  . Icd  07/15/2009    implant Dr. Ladona Ridgel  . Coronary artery bypass graft  2001    x 4  . Cardiac catheterization  2001    PTCA  . Cardiac catheterization  2005    patent grafts  . Cataracts  2006    right eye  . Implantable cardioverter defibrillator (icd) generator change N/A 11/28/2012    Procedure: ICD GENERATOR CHANGE;  Surgeon: Marinus Maw, MD;  Location: [facility] CATH LAB;  Service: Cardiovascular;  Laterality: N/A;     Family History  Problem Relation Age of Onset  . Heart disease Mother   . Aneurysm Brother   . Stroke Mother   . Stroke Father      History   Social History  . Marital Status: Married    Spouse Name: N/A  . Number of Children: 2  . Years of  Education: N/A   Occupational History  . Psychologist, prison and probation services    Social History Main Topics  . Smoking status: Former Smoker    Quit date: 09/30/2000  . Smokeless tobacco: Not on file  . Alcohol Use: Not on file  . Drug Use: Not on file  . Sexual Activity: Not on file   Other Topics Concern  . Not on file   Social History Narrative     BP 102/70 mmHg  Pulse 61  Ht 5\' 4"  (1.626 m)  Wt 155 lb (70.308 kg)  BMI 26.59 kg/m2  Physical Exam:  Well appearing 74 year old man,NAD HEENT: Unremarkable Neck:  No JVD, no thyromegally Lungs:  Clear with no wheezes, rales, or rhonchi. Well-healed ICD incision. HEART:  Regular rate rhythm, no murmurs, no rubs, no clicks Abd:  soft, positive bowel sounds, no organomegally, no rebound, no guarding Ext:  2 plus pulses, no edema, no cyanosis, no clubbing Skin:  No rashes no nodules Neuro:  CN II through XII intact, motor grossly intact   DEVICE  Normal device function.  See PaceArt for details.   Assess/Plan:

## 2015-02-10 NOTE — Assessment & Plan Note (Signed)
His blood pressure is well controlled. Will follow. 

## 2015-02-10 NOTE — Assessment & Plan Note (Signed)
After years of chronic atrial fib, he has reverted to nsr after several months of amiodarone. He will continue his current meds.

## 2015-02-10 NOTE — Patient Instructions (Signed)
Your physician wants you to follow-up in: 12  Months with Dr Court Joy will receive a reminder letter in the mail two months in advance. If you don't receive a letter, please call our office to schedule the follow-up appointment.  Remote monitoring is used to monitor your Pacemaker or ICD from home. This monitoring reduces the number of office visits required to check your device to one time per year. It allows [place] to keep an eye on the functioning of your device to ensure it is working properly. You are scheduled for a device check from home on 05/12/15. You may send your transmission at any time that day. If you have a wireless device, the transmission will be sent automatically. After your physician reviews your transmission, you will receive a postcard with your next transmission date.

## 2015-02-10 NOTE — Assessment & Plan Note (Signed)
His Medtronic ICD is working normally. Will recheck in several months. 

## 2015-02-11 ENCOUNTER — Telehealth: Payer: Self-pay

## 2015-02-11 MED ORDER — LORAZEPAM 1 MG PO TABS
1.0000 mg | ORAL_TABLET | Freq: Three times a day (TID) | ORAL | Status: DC | PRN
Start: 2015-02-11 — End: 2015-02-18

## 2015-02-11 NOTE — Telephone Encounter (Signed)
Received call from patient's daughter stated insurance will not cover lorazepam.Stated she wanted to ask Dr.Jordan if he can take diazepam.Dr.Jordan out of office will ask him 02/16/15 and call her back.

## 2015-02-11 NOTE — Telephone Encounter (Signed)
Insurance will not cover alprazolam.Dr.Jordan advised ok for lorazepam 1 mg three times a day as needed for anxiety.Prescription phoned into pharmacy.

## 2015-02-18 ENCOUNTER — Telehealth: Payer: Self-pay | Admitting: Cardiology

## 2015-02-18 MED ORDER — ALPRAZOLAM 0.5 MG PO TABS: 0.5000 mg | ORAL_TABLET | Freq: Two times a day (BID) | ORAL | Status: DC | PRN

## 2015-02-18 NOTE — Telephone Encounter (Signed)
Returned call to patient's wife Dr.Jordan advised he did not recommend diazepam.Advised to continue alprazolam.Refill sent to pharmacy.

## 2015-02-18 NOTE — Telephone Encounter (Signed)
She says Pt can get his Alprazolam cheaper at [facility],She needs a prescription for this.

## 2015-02-25 ENCOUNTER — Other Ambulatory Visit: Payer: Self-pay | Admitting: Cardiology

## 2015-02-25 MED ORDER — ALPRAZOLAM 0.5 MG PO TABS: 0.5000 mg | ORAL_TABLET | Freq: Two times a day (BID) | ORAL | Status: DC | PRN

## 2015-02-25 NOTE — Telephone Encounter (Signed)
OK to fill Xanax? If so, quantity & refills?

## 2015-02-25 NOTE — Telephone Encounter (Signed)
Alprazolam 0.5 mg called to pharmacy.

## 2015-02-25 NOTE — Telephone Encounter (Signed)
°  1. Which medications need to be refilled? Alprazolam(BID)  2. Which pharmacy is medication to be sent to?[facility]  3. Do they need a 30 day or 90 day supply? 30  4. Would they like a call back once the medication has been sent to the pharmacy? yes

## 2015-02-28 ENCOUNTER — Other Ambulatory Visit: Payer: Self-pay | Admitting: Cardiology

## 2015-03-02 ENCOUNTER — Other Ambulatory Visit: Payer: Self-pay | Admitting: Pharmacist

## 2015-03-02 MED ORDER — CARVEDILOL 25 MG PO TABS
25.0000 mg | ORAL_TABLET | Freq: Two times a day (BID) | ORAL | Status: DC
Start: 2015-03-02 — End: 2015-06-10

## 2015-03-02 MED ORDER — WARFARIN SODIUM 1 MG PO TABS: ORAL_TABLET | ORAL | Status: DC

## 2015-03-10 ENCOUNTER — Ambulatory Visit (INDEPENDENT_AMBULATORY_CARE_PROVIDER_SITE_OTHER): Payer: Medicare Other | Admitting: Surgery

## 2015-03-10 DIAGNOSIS — I482 Chronic atrial fibrillation, unspecified: Secondary | ICD-10-CM

## 2015-03-10 DIAGNOSIS — Z7901 Long term (current) use of anticoagulants: Secondary | ICD-10-CM

## 2015-03-10 DIAGNOSIS — I639 Cerebral infarction, unspecified: Secondary | ICD-10-CM

## 2015-03-10 DIAGNOSIS — Z5181 Encounter for therapeutic drug level monitoring: Secondary | ICD-10-CM | POA: Diagnosis not present

## 2015-03-10 DIAGNOSIS — I4891 Unspecified atrial fibrillation: Secondary | ICD-10-CM

## 2015-03-10 DIAGNOSIS — I6350 Cerebral infarction due to unspecified occlusion or stenosis of unspecified cerebral artery: Secondary | ICD-10-CM

## 2015-03-10 LAB — POCT INR: INR: 3.3

## 2015-03-31 ENCOUNTER — Ambulatory Visit (INDEPENDENT_AMBULATORY_CARE_PROVIDER_SITE_OTHER): Payer: Medicare Other | Admitting: *Deleted

## 2015-03-31 DIAGNOSIS — I639 Cerebral infarction, unspecified: Secondary | ICD-10-CM

## 2015-03-31 DIAGNOSIS — I482 Chronic atrial fibrillation, unspecified: Secondary | ICD-10-CM

## 2015-03-31 DIAGNOSIS — I4891 Unspecified atrial fibrillation: Secondary | ICD-10-CM | POA: Diagnosis not present

## 2015-03-31 DIAGNOSIS — Z7901 Long term (current) use of anticoagulants: Secondary | ICD-10-CM | POA: Diagnosis not present

## 2015-03-31 DIAGNOSIS — Z5181 Encounter for therapeutic drug level monitoring: Secondary | ICD-10-CM

## 2015-03-31 DIAGNOSIS — I6350 Cerebral infarction due to unspecified occlusion or stenosis of unspecified cerebral artery: Secondary | ICD-10-CM

## 2015-03-31 LAB — POCT INR: INR: 2.7

## 2015-04-28 ENCOUNTER — Telehealth: Payer: Self-pay

## 2015-04-28 ENCOUNTER — Ambulatory Visit (INDEPENDENT_AMBULATORY_CARE_PROVIDER_SITE_OTHER): Payer: Medicare Other

## 2015-04-28 DIAGNOSIS — I639 Cerebral infarction, unspecified: Secondary | ICD-10-CM | POA: Diagnosis not present

## 2015-04-28 DIAGNOSIS — I4891 Unspecified atrial fibrillation: Secondary | ICD-10-CM

## 2015-04-28 DIAGNOSIS — I6350 Cerebral infarction due to unspecified occlusion or stenosis of unspecified cerebral artery: Secondary | ICD-10-CM

## 2015-04-28 DIAGNOSIS — Z5181 Encounter for therapeutic drug level monitoring: Secondary | ICD-10-CM | POA: Diagnosis not present

## 2015-04-28 DIAGNOSIS — Z7901 Long term (current) use of anticoagulants: Secondary | ICD-10-CM | POA: Diagnosis not present

## 2015-04-28 LAB — POCT INR: INR: 3

## 2015-04-28 MED ORDER — ALPRAZOLAM 0.5 MG PO TABS: 0.5000 mg | ORAL_TABLET | Freq: Three times a day (TID) | ORAL | Status: DC | PRN

## 2015-04-28 NOTE — Telephone Encounter (Signed)
Received a call from patient's daughter Angelique Blonder she stated father is at [facility] to pick up alprazolam and it is too early.Stated father has took three times a day several times.Stated he needs a new prescription called in with three times a day if needed for anxiety.Alprazolam prescription called into pharmacy.

## 2015-05-12 ENCOUNTER — Telehealth: Payer: Self-pay | Admitting: Cardiology

## 2015-05-12 ENCOUNTER — Encounter: Payer: Medicare Other | Admitting: *Deleted

## 2015-05-12 NOTE — Telephone Encounter (Signed)
Confirmed remote transmission pt daughter

## 2015-05-13 ENCOUNTER — Encounter: Payer: Self-pay | Admitting: Cardiology

## 2015-05-25 ENCOUNTER — Ambulatory Visit (INDEPENDENT_AMBULATORY_CARE_PROVIDER_SITE_OTHER): Payer: Medicare Other | Admitting: *Deleted

## 2015-05-25 DIAGNOSIS — I4891 Unspecified atrial fibrillation: Secondary | ICD-10-CM

## 2015-05-25 DIAGNOSIS — Z5181 Encounter for therapeutic drug level monitoring: Secondary | ICD-10-CM

## 2015-05-25 DIAGNOSIS — I639 Cerebral infarction, unspecified: Secondary | ICD-10-CM | POA: Diagnosis not present

## 2015-05-25 DIAGNOSIS — Z7901 Long term (current) use of anticoagulants: Secondary | ICD-10-CM

## 2015-05-25 DIAGNOSIS — I6350 Cerebral infarction due to unspecified occlusion or stenosis of unspecified cerebral artery: Secondary | ICD-10-CM

## 2015-05-25 LAB — POCT INR: INR: 3.3

## 2015-06-09 ENCOUNTER — Telehealth: Payer: Self-pay | Admitting: Cardiology

## 2015-06-09 NOTE — Telephone Encounter (Signed)
°  1. Which medications need to be refilled? Carvedilol  2. Which pharmacy is medication to be sent to?[facility] 3. Do they need a 30 day or 90 day supply? 30 and refills  4. Would they like a call back once the medication has been sent to the pharmacy? yes

## 2015-06-10 MED ORDER — CARVEDILOL 25 MG PO TABS: 25.0000 mg | ORAL_TABLET | Freq: Two times a day (BID) | ORAL | Status: DC

## 2015-06-10 NOTE — Telephone Encounter (Signed)
Pt calling back again today,still have not received his medicine.

## 2015-06-10 NOTE — Telephone Encounter (Signed)
Carvedilol refill sent to pharmacy 

## 2015-06-22 ENCOUNTER — Ambulatory Visit (INDEPENDENT_AMBULATORY_CARE_PROVIDER_SITE_OTHER): Payer: Medicare Other | Admitting: *Deleted

## 2015-06-22 ENCOUNTER — Telehealth: Payer: Self-pay | Admitting: Cardiovascular Disease

## 2015-06-22 DIAGNOSIS — Z7901 Long term (current) use of anticoagulants: Secondary | ICD-10-CM

## 2015-06-22 DIAGNOSIS — I6350 Cerebral infarction due to unspecified occlusion or stenosis of unspecified cerebral artery: Secondary | ICD-10-CM

## 2015-06-22 DIAGNOSIS — Z5181 Encounter for therapeutic drug level monitoring: Secondary | ICD-10-CM

## 2015-06-22 DIAGNOSIS — I4891 Unspecified atrial fibrillation: Secondary | ICD-10-CM | POA: Diagnosis not present

## 2015-06-22 DIAGNOSIS — I639 Cerebral infarction, unspecified: Secondary | ICD-10-CM | POA: Diagnosis not present

## 2015-06-22 LAB — POCT INR: INR: 2.6

## 2015-06-22 NOTE — Telephone Encounter (Signed)
ERron

## 2015-06-29 ENCOUNTER — Ambulatory Visit (INDEPENDENT_AMBULATORY_CARE_PROVIDER_SITE_OTHER): Payer: Medicare Other | Admitting: *Deleted

## 2015-06-29 DIAGNOSIS — I5022 Chronic systolic (congestive) heart failure: Secondary | ICD-10-CM

## 2015-06-29 DIAGNOSIS — I255 Ischemic cardiomyopathy: Secondary | ICD-10-CM | POA: Diagnosis not present

## 2015-06-29 NOTE — Progress Notes (Signed)
Remote ICD transmission.   

## 2015-07-11 ENCOUNTER — Other Ambulatory Visit: Payer: Self-pay

## 2015-07-11 ENCOUNTER — Ambulatory Visit (INDEPENDENT_AMBULATORY_CARE_PROVIDER_SITE_OTHER): Payer: Medicare Other | Admitting: Cardiology

## 2015-07-11 ENCOUNTER — Ambulatory Visit (INDEPENDENT_AMBULATORY_CARE_PROVIDER_SITE_OTHER): Payer: Medicare Other | Admitting: Pharmacist Clinician (PhC)/ Clinical Pharmacy Specialist

## 2015-07-11 ENCOUNTER — Encounter: Payer: Self-pay | Admitting: Cardiology

## 2015-07-11 VITALS — BP 112/68 | HR 82 | Ht 66.0 in | Wt 156.0 lb

## 2015-07-11 DIAGNOSIS — I639 Cerebral infarction, unspecified: Secondary | ICD-10-CM

## 2015-07-11 DIAGNOSIS — I472 Ventricular tachycardia: Secondary | ICD-10-CM

## 2015-07-11 DIAGNOSIS — I482 Chronic atrial fibrillation, unspecified: Secondary | ICD-10-CM

## 2015-07-11 DIAGNOSIS — Z95810 Presence of automatic (implantable) cardiac defibrillator: Secondary | ICD-10-CM

## 2015-07-11 DIAGNOSIS — Z5181 Encounter for therapeutic drug level monitoring: Secondary | ICD-10-CM

## 2015-07-11 DIAGNOSIS — Z951 Presence of aortocoronary bypass graft: Secondary | ICD-10-CM

## 2015-07-11 DIAGNOSIS — I4891 Unspecified atrial fibrillation: Secondary | ICD-10-CM

## 2015-07-11 DIAGNOSIS — I6350 Cerebral infarction due to unspecified occlusion or stenosis of unspecified cerebral artery: Secondary | ICD-10-CM

## 2015-07-11 DIAGNOSIS — I5022 Chronic systolic (congestive) heart failure: Secondary | ICD-10-CM | POA: Diagnosis not present

## 2015-07-11 DIAGNOSIS — Z7901 Long term (current) use of anticoagulants: Secondary | ICD-10-CM | POA: Diagnosis not present

## 2015-07-11 DIAGNOSIS — I25709 Atherosclerosis of coronary artery bypass graft(s), unspecified, with unspecified angina pectoris: Secondary | ICD-10-CM

## 2015-07-11 DIAGNOSIS — I255 Ischemic cardiomyopathy: Secondary | ICD-10-CM | POA: Diagnosis not present

## 2015-07-11 DIAGNOSIS — I4729 Other ventricular tachycardia: Secondary | ICD-10-CM

## 2015-07-11 DIAGNOSIS — I10 Essential (primary) hypertension: Secondary | ICD-10-CM

## 2015-07-11 LAB — CBC WITH DIFFERENTIAL/PLATELET
Basophils Absolute: 0.1 10*3/uL (ref 0.0–0.1)
Basophils Relative: 1 % (ref 0–1)
EOS PCT: 2 % (ref 0–5)
Eosinophils Absolute: 0.1 10*3/uL (ref 0.0–0.7)
HCT: 43.3 % (ref 39.0–52.0)
Hemoglobin: 14.7 g/dL (ref 13.0–17.0)
LYMPHS ABS: 1.8 10*3/uL (ref 0.7–4.0)
LYMPHS PCT: 27 % (ref 12–46)
MCH: 32 pg (ref 26.0–34.0)
MCHC: 33.9 g/dL (ref 30.0–36.0)
MCV: 94.3 fL (ref 78.0–100.0)
MPV: 10.7 fL (ref 8.6–12.4)
Monocytes Absolute: 0.9 10*3/uL (ref 0.1–1.0)
Monocytes Relative: 13 % — ABNORMAL HIGH (ref 3–12)
NEUTROS ABS: 3.8 10*3/uL (ref 1.7–7.7)
Neutrophils Relative %: 57 % (ref 43–77)
PLATELETS: 166 10*3/uL (ref 150–400)
RBC: 4.59 MIL/uL (ref 4.22–5.81)
RDW: 15.3 % (ref 11.5–15.5)
WBC: 6.7 10*3/uL (ref 4.0–10.5)

## 2015-07-11 LAB — POCT INR: INR: 2.5

## 2015-07-11 MED ORDER — CARVEDILOL 25 MG PO TABS: 25.0000 mg | ORAL_TABLET | Freq: Two times a day (BID) | ORAL | Status: DC

## 2015-07-11 MED ORDER — ALLOPURINOL 100 MG PO TABS: 100.0000 mg | ORAL_TABLET | Freq: Every day | ORAL | Status: DC

## 2015-07-11 NOTE — Progress Notes (Signed)
[name] [name] Date of Birth: [birth year] Medical Record #[id number]  History of Present Illness: [name] [name]  is seen today for followup CHF.  He has a history of coronary disease and is status post CABG in 2001. This included an LIMA graft to the LAD, saphenous vein graft to the first obtuse marginal vessel, and saphenous vein graft to the PDA. Cardiac catheterization in 2005 showed patent grafts. He has a history of chronic atrial fibrillation. He also has a history of ventricular tachycardia and has a biventricular ICD. This was revised in December 2013. He has ischemic cardiomyopathy with ejection fraction of 20-25% by Echo in June 2015. He does have chronic kidney disease and is followed by nephrology. In July 2015 he received an ICD shock and was started on amiodarone. His last ICD check on July 20th was OK.  On followup today he states he is doing very well. He remains active. He has no significant swelling or chest pain. He does note some fatigue and SOB about 1 pm but no orthopnea or PND. No palpitations or ICD discharges.   Current Outpatient Prescriptions on File Prior to Visit  Medication Sig Dispense Refill  . allopurinol (ZYLOPRIM) 100 MG tablet TAKE 1 BY MOUTH DAILY 90 tablet 0  . ALPRAZolam (XANAX) 0.5 MG tablet Take 1 tablet (0.5 mg total) by mouth 3 (three) times daily as needed for anxiety. 90 tablet 1  . amiodarone (PACERONE) 200 MG tablet Take 1 tablet (200 mg total) by mouth daily. 30 tablet 6  . calcitRIOL (ROCALTROL) 0.25 MCG capsule Take 0.25 mcg by mouth daily.     . carvedilol (COREG) 25 MG tablet Take 1 tablet (25 mg total) by mouth 2 (two) times daily. 60 tablet 6  . digoxin (LANOXIN) 0.125 MG tablet Take 1/2 tablet daily (Patient taking differently: Take 1/2 tablet by mouth daily) 45 tablet 1  . furosemide (LASIX) 40 MG tablet Take 40 mg by mouth daily. Take 1 tablets in the morning and 1 tablet in the evening prn    . lovastatin (MEVACOR) 40 MG tablet TAKE 1 BY MOUTH AT  BEDTIME (Patient taking differently: TAKE 1 TABLET BY MOUTH AT BEDTIME) 90 tablet 3  . Multiple Vitamins-Minerals (PRESERVISION AREDS 2) CAPS Take 1 capsule by mouth 2 (two) times daily.    . potassium chloride (K-DUR) 10 MEQ tablet Take 1 tablet (10 mEq total) by mouth daily. 90 tablet 3  . vitamin B-12 (CYANOCOBALAMIN) 100 MCG tablet Take 50 mcg by mouth daily.    Marland Kitchen warfarin (COUMADIN) 1 MG tablet 1 tablet every day or as directed by coumadin clinic 90 tablet 1   No current facility-administered medications on file prior to visit.    Allergies  Allergen Reactions  . Nitroglycerin Other (See Comments)    Became severely hypotensive  . Other     Intolerance to ACE inhibitors UNKNOWN REACTION    Past Medical History  Diagnosis Date  . Coronary artery disease   . Cardiomyopathy, ischemic     EF of 30-35%  . Status post CVA   . VT (ventricular tachycardia) aug of 2010    removed old ICD and replaced with biventricular ICD  . CKD (chronic kidney disease) stage 3, GFR 30-59 ml/min   . Atrial fibrillation     chronic  . Hypertension   . Dyslipidemia   . Peripheral vascular disease   . Thyroid disease     hypo,due to amiodarone therapy,resolved  . Myocardial infarction January 2001  History of anteroseptal myocardial infarction presenting with left bundle-branch block  . Anxiety   . Tobacco abuse     History of 2 pack per day tobacco habituation  . Pacemaker   . CHF (congestive heart failure)     Past Surgical History  Procedure Laterality Date  . Cholecystectomy    . Femoral bypass      fem-fem bpg, right fem pop bpg  . Icd  07/15/2009    implant Dr. Ladona Ridgel  . Coronary artery bypass graft  2001    x 4  . Cardiac catheterization  2001    PTCA  . Cardiac catheterization  2005    patent grafts  . Cataracts  2006    right eye  . Implantable cardioverter defibrillator (icd) generator change N/A 11/28/2012    Procedure: ICD GENERATOR CHANGE;  Surgeon: Marinus Maw,  MD;  Location: [facility] CATH LAB;  Service: Cardiovascular;  Laterality: N/A;    History  Smoking status  . Former Smoker  . Quit date: 09/30/2000  Smokeless tobacco  . Not on file    History  Alcohol Use: Not on file    Family History  Problem Relation Age of Onset  . Heart disease Mother   . Aneurysm Brother   . Stroke Mother   . Stroke Father     Review of Systems: As noted in history of present illness. All other systems were reviewed and are negative.  Physical Exam: BP 112/68 mmHg  Pulse 82  Ht  (1.676 m)  Wt 70.761 kg (156 lb)  BMI 25.19 kg/m2 He is a pleasant white male who appears chronically ill. HEENT exam is unremarkable. Neck is supple no JVD, adenopathy, thyromegaly, or bruits. Lungs are clear. His ICD site is stable. Cardiac exam reveals a regular rate and rhythm without gallop, murmur, or click. Abdomen is soft and nontender. There are no masses or bruits. Extremities are without edema. Pedal pulses are good. Skin is warm and dry. He is alert and oriented x3. Cranial nerves II through XII are intact.  LABORATORY DATA: Echo 05/10/14: Study Conclusions  - Left ventricle: Multiple RWMA;s including septum, apex anterior wall and mid and distal inferior wall. The cavity size was severely dilated. Wall thickness was normal. Systolic function was severely reduced. The estimated ejection fraction was in the range of 20% to 25%. - Left atrium: The atrium was mildly dilated. - Atrial septum: No defect or patent foramen ovale was identified.   Assessment / Plan: 1. Chronic systolic congestive heart failure. EF 20-25%.  He is well compensated. We will continue with his current medication including carvedilol, digoxin, and Lasix. Avoid ACE inhibitor/ARB due to to renal insufficiency. I will check Dig level, CMET, and CBC today.   He is BiV paced.   2. History of ventricular tachycardia. Status post biventricular ICD. ICD shock in July 2015. Now on amiodarone.  Will check TSH. No recurrent shocks on amiodarone.  3. Chronic atrial fibrillation. He is on anticoagulation with therapeutic INRs. Rate is very well controlled. INR check today  4. Remote CVA.  5. Chronic kidney disease stage III. Stable.  6. Coronary disease status post CABG. No recurrent angina.

## 2015-07-11 NOTE — Patient Instructions (Signed)
Continue your current therapy  We will check blood work today  I will see you in 6 months. 

## 2015-07-12 ENCOUNTER — Other Ambulatory Visit: Payer: Self-pay

## 2015-07-12 DIAGNOSIS — R7989 Other specified abnormal findings of blood chemistry: Secondary | ICD-10-CM

## 2015-07-12 LAB — COMPREHENSIVE METABOLIC PANEL
ALBUMIN: 4 g/dL (ref 3.6–5.1)
ALT: 50 U/L — ABNORMAL HIGH (ref 9–46)
AST: 55 U/L — AB (ref 10–35)
Alkaline Phosphatase: 75 U/L (ref 40–115)
BUN: 49 mg/dL — ABNORMAL HIGH (ref 7–25)
CO2: 26 mmol/L (ref 20–31)
CREATININE: 2.68 mg/dL — AB (ref 0.70–1.18)
Calcium: 9.6 mg/dL (ref 8.6–10.3)
Chloride: 102 mmol/L (ref 98–110)
Glucose, Bld: 82 mg/dL (ref 65–99)
Potassium: 4.6 mmol/L (ref 3.5–5.3)
Sodium: 140 mmol/L (ref 135–146)
TOTAL PROTEIN: 7 g/dL (ref 6.1–8.1)
Total Bilirubin: 0.6 mg/dL (ref 0.2–1.2)

## 2015-07-12 LAB — LIPID PANEL
Cholesterol: 104 mg/dL — ABNORMAL LOW (ref 125–200)
HDL: 32 mg/dL — ABNORMAL LOW (ref >=40)
LDL CALC: 37 mg/dL (ref <130)
TRIGLYCERIDES: 173 mg/dL — AB (ref <150)
Total CHOL/HDL Ratio: 3.3 Ratio (ref <=5.0)
VLDL: 35 mg/dL — ABNORMAL HIGH (ref <30)

## 2015-07-12 LAB — TSH: TSH: 10.754 u[IU]/mL — ABNORMAL HIGH (ref 0.350–4.500)

## 2015-07-12 LAB — DIGOXIN LEVEL: DIGOXIN LVL: 1.3 ug/L (ref 0.8–2.0)

## 2015-07-12 MED ORDER — LEVOTHYROXINE SODIUM 50 MCG PO TABS: 50.0000 ug | ORAL_TABLET | Freq: Every day | ORAL | Status: DC

## 2015-07-13 LAB — CUP PACEART REMOTE DEVICE CHECK
Battery Remaining Longevity: 28 mo
Battery Voltage: 2.93 V
Brady Statistic AP VS Percent: 0.01 %
Brady Statistic AS VP Percent: 9.06 %
Brady Statistic RV Percent Paced: 99.84 %
HighPow Impedance: 47 Ohm
HighPow Impedance: 62 Ohm
Lead Channel Impedance Value: 323 Ohm
Lead Channel Impedance Value: 437 Ohm
Lead Channel Impedance Value: 551 Ohm
Lead Channel Impedance Value: 665 Ohm
Lead Channel Impedance Value: 722 Ohm
Lead Channel Impedance Value: 893 Ohm
Lead Channel Pacing Threshold Pulse Width: 0.4 ms
Lead Channel Pacing Threshold Pulse Width: 0.8 ms
Lead Channel Sensing Intrinsic Amplitude: 0.625 mV
Lead Channel Sensing Intrinsic Amplitude: 0.625 mV
Lead Channel Sensing Intrinsic Amplitude: 11.5 mV
Lead Channel Setting Pacing Amplitude: 2 V
Lead Channel Setting Pacing Amplitude: 2.75 V
Lead Channel Setting Pacing Amplitude: 3 V
Lead Channel Setting Sensing Sensitivity: 0.3 mV
MDC IDC MSMT LEADCHNL LV PACING THRESHOLD AMPLITUDE: 1.625 V
MDC IDC MSMT LEADCHNL RV PACING THRESHOLD AMPLITUDE: 1.25 V
MDC IDC MSMT LEADCHNL RV SENSING INTR AMPL: 11.5 mV
MDC IDC SESS DTM: 20160720190133
MDC IDC SET LEADCHNL LV PACING PULSEWIDTH: 0.8 ms
MDC IDC SET LEADCHNL RV PACING PULSEWIDTH: 0.4 ms
MDC IDC SET ZONE DETECTION INTERVAL: 320 ms
MDC IDC SET ZONE DETECTION INTERVAL: 350 ms
MDC IDC STAT BRADY AP VP PERCENT: 90.93 %
MDC IDC STAT BRADY AS VS PERCENT: 0 %
MDC IDC STAT BRADY RA PERCENT PACED: 90.94 %
Zone Setting Detection Interval: 400 ms
Zone Setting Detection Interval: 450 ms

## 2015-07-21 ENCOUNTER — Other Ambulatory Visit: Payer: Self-pay

## 2015-07-21 ENCOUNTER — Telehealth: Payer: Self-pay | Admitting: Cardiology

## 2015-07-21 MED ORDER — FUROSEMIDE 40 MG PO TABS: 40.0000 mg | ORAL_TABLET | Freq: Every day | ORAL | Status: DC

## 2015-07-21 MED ORDER — POTASSIUM CHLORIDE ER 10 MEQ PO TBCR: 10.0000 meq | EXTENDED_RELEASE_TABLET | Freq: Every day | ORAL | Status: DC

## 2015-07-21 MED ORDER — WARFARIN SODIUM 1 MG PO TABS: ORAL_TABLET | ORAL | Status: DC

## 2015-07-21 NOTE — Telephone Encounter (Signed)
Rx sent 

## 2015-07-21 NOTE — Telephone Encounter (Signed)
Please refill Coumadin 

## 2015-07-21 NOTE — Telephone Encounter (Signed)
°  1. Which medications need to be refilled? Warfarin,Digoxin, and Tot Cl Micro ER  2. Which [phone]  3. Do they need a 30 day or 90 day supply? 30 and refills  4. Would they like a call back once the medication has been sent to the pharmacy? yes

## 2015-07-28 ENCOUNTER — Telehealth: Payer: Self-pay | Admitting: Cardiology

## 2015-07-28 NOTE — Telephone Encounter (Signed)
OK to refill Xanax but he will need to start keeping track of number of pills and how much hie is using.  Micheale Schlack [name] MD, [facility]

## 2015-07-28 NOTE — Telephone Encounter (Signed)
Spoke to daughter [name] [name] - apparently patient is running short on his Xanax - no refill authorized until Monday.   Pt prescribed for xanax 0.5mg  3 times a day as needed. Daughter states "I'm sure he's not taking more than twice a day".  She does not think med is being diverted. No explanation for why patient is short on pills - he has 5 left until Monday.  Informed her I would discuss w/ [name] at her request & send to Dr. [name] for his review.  Spoke w/ [name], reviewed chart - apparently similar problem happened about 3 months ago at time of last refill - pt ran short a couple days before refill authorized.

## 2015-07-28 NOTE — Telephone Encounter (Signed)
[name] [name] is calling about one of his medication. Please call  Thanks

## 2015-07-29 MED ORDER — ALPRAZOLAM 0.5 MG PO TABS: 0.5000 mg | ORAL_TABLET | Freq: Three times a day (TID) | ORAL | Status: DC | PRN

## 2015-07-29 NOTE — Telephone Encounter (Signed)
Returned call to patient's daughter.Dr.Jordan advised ok to refill xanax.Be more careful keeping tract of number of pills.Refill sent to pharmacy.

## 2015-07-29 NOTE — Addendum Note (Signed)
Addended by: Meda Klinefelter D on: 07/29/2015 10:18 AM   Modules accepted: Orders

## 2015-08-01 ENCOUNTER — Telehealth: Payer: Self-pay | Admitting: Cardiology

## 2015-08-01 NOTE — Telephone Encounter (Signed)
Returned call to patient's wife.She stated husband has been feeling bad since last Thurs or Fri.Stated he is weak,no energy,diarrhea,had chills last night.No chest pain.Felt a little sob this past Sat night,no sob since.Stated when she picked up new potassium prescription name changed to klor con 10 meq instead of kdur 10 meq.Advised klor con 10 meq ok to take daily.Advised she needs to call PCP.Advised to call back if needed.

## 2015-08-01 NOTE — Telephone Encounter (Signed)
Please call,concerning his Potassium medicine. He have not felt good all weekend,

## 2015-08-11 ENCOUNTER — Encounter: Payer: Self-pay | Admitting: Cardiology

## 2015-08-12 ENCOUNTER — Ambulatory Visit (INDEPENDENT_AMBULATORY_CARE_PROVIDER_SITE_OTHER): Payer: Medicare Other | Admitting: Pharmacist

## 2015-08-12 DIAGNOSIS — I482 Chronic atrial fibrillation, unspecified: Secondary | ICD-10-CM

## 2015-08-12 DIAGNOSIS — I6350 Cerebral infarction due to unspecified occlusion or stenosis of unspecified cerebral artery: Secondary | ICD-10-CM

## 2015-08-12 DIAGNOSIS — I4891 Unspecified atrial fibrillation: Secondary | ICD-10-CM | POA: Diagnosis not present

## 2015-08-12 DIAGNOSIS — Z5181 Encounter for therapeutic drug level monitoring: Secondary | ICD-10-CM | POA: Diagnosis not present

## 2015-08-12 DIAGNOSIS — Z7901 Long term (current) use of anticoagulants: Secondary | ICD-10-CM

## 2015-08-12 DIAGNOSIS — I639 Cerebral infarction, unspecified: Secondary | ICD-10-CM | POA: Diagnosis not present

## 2015-08-12 LAB — POCT INR: INR: 2.8

## 2015-08-24 ENCOUNTER — Encounter: Payer: Self-pay | Admitting: Internal Medicine

## 2015-08-24 ENCOUNTER — Telehealth: Payer: Self-pay | Admitting: Cardiology

## 2015-08-24 MED ORDER — LOVASTATIN 40 MG PO TABS: 40.0000 mg | ORAL_TABLET | Freq: Every day | ORAL | Status: DC

## 2015-08-24 MED ORDER — DIGOXIN 125 MCG PO TABS: ORAL_TABLET | ORAL | Status: DC

## 2015-08-24 NOTE — Telephone Encounter (Signed)
°  1. Which medications need to be refilled? Lovastatin and Digoxin   2. Which [facility] [place]   3. Do they need a 30 day or 90 day supply? She did not say  4. Would they like a call back once the medication has been sent to the pharmacy? Yes

## 2015-08-24 NOTE — Telephone Encounter (Signed)
Re-ordered Lovastatin and Digoxin.  [facility] [place].  Called patient LMTCB if necessary

## 2015-09-02 ENCOUNTER — Other Ambulatory Visit: Payer: Self-pay | Admitting: *Deleted

## 2015-09-02 ENCOUNTER — Other Ambulatory Visit: Payer: Self-pay

## 2015-09-02 DIAGNOSIS — I472 Ventricular tachycardia, unspecified: Secondary | ICD-10-CM

## 2015-09-02 MED ORDER — AMIODARONE HCL 200 MG PO TABS: 200.0000 mg | ORAL_TABLET | Freq: Every day | ORAL | Status: DC

## 2015-09-02 NOTE — Telephone Encounter (Signed)
2. History of ventricular tachycardia. Status post biventricular ICD. ICD shock in July 2015. Now on amiodarone. Will check TSH. No recurrent shocks on amiodarone. Peter M [name], MD at 07/11/2015 10:04 AM  amiodarone (PACERONE) 200 MG tabletTake 1 tablet (200 mg total) by mouth daily Notes Recorded by Peter M [name], MD on 07/12/2015 at 9:43 AM TSH is high indicating he is hypothyroid- probably due to amiodarone. Dig level is OK. Renal function is a little worse than 6 months ago. Other chemistries are OK. Lipids look very good. CBC is normal.  Recommend: start synthroid 50 micrograms daily. Repeat TSH and free T4 in 3 months. Should forward labs to nephrology. Continue other therapy.

## 2015-09-29 ENCOUNTER — Ambulatory Visit (INDEPENDENT_AMBULATORY_CARE_PROVIDER_SITE_OTHER): Payer: Medicare Other | Admitting: Pharmacist

## 2015-09-29 DIAGNOSIS — Z5181 Encounter for therapeutic drug level monitoring: Secondary | ICD-10-CM

## 2015-09-29 DIAGNOSIS — I6350 Cerebral infarction due to unspecified occlusion or stenosis of unspecified cerebral artery: Secondary | ICD-10-CM

## 2015-09-29 DIAGNOSIS — Z7901 Long term (current) use of anticoagulants: Secondary | ICD-10-CM | POA: Diagnosis not present

## 2015-09-29 DIAGNOSIS — I4891 Unspecified atrial fibrillation: Secondary | ICD-10-CM | POA: Diagnosis not present

## 2015-09-29 DIAGNOSIS — I482 Chronic atrial fibrillation, unspecified: Secondary | ICD-10-CM

## 2015-09-29 LAB — POCT INR: INR: 2.6

## 2015-10-03 ENCOUNTER — Telehealth: Payer: Self-pay | Admitting: Cardiology

## 2015-10-03 ENCOUNTER — Ambulatory Visit (INDEPENDENT_AMBULATORY_CARE_PROVIDER_SITE_OTHER): Payer: Medicare Other | Admitting: *Deleted

## 2015-10-03 DIAGNOSIS — I255 Ischemic cardiomyopathy: Secondary | ICD-10-CM

## 2015-10-03 DIAGNOSIS — I5022 Chronic systolic (congestive) heart failure: Secondary | ICD-10-CM | POA: Diagnosis not present

## 2015-10-03 NOTE — Telephone Encounter (Signed)
Spoke with pt and reminded pt of remote transmission that is due today. Pt verbalized understanding.   

## 2015-10-04 ENCOUNTER — Encounter: Payer: Self-pay | Admitting: Cardiology

## 2015-10-05 NOTE — Progress Notes (Signed)
Remote ICD transmission.   

## 2015-10-07 ENCOUNTER — Telehealth: Payer: Self-pay | Admitting: Internal Medicine

## 2015-10-07 ENCOUNTER — Encounter: Payer: Self-pay | Admitting: Cardiology

## 2015-10-07 LAB — CUP PACEART REMOTE DEVICE CHECK
Battery Remaining Longevity: 21 mo
Brady Statistic AP VS Percent: 0 %
Brady Statistic RV Percent Paced: 99.78 %
Date Time Interrogation Session: 20161025163146
HighPow Impedance: 46 Ohm
HighPow Impedance: 59 Ohm
Implantable Lead Implant Date: 20010126
Implantable Lead Location: 753859
Implantable Lead Model: 6944
Lead Channel Impedance Value: 456 Ohm
Lead Channel Impedance Value: 551 Ohm
Lead Channel Pacing Threshold Amplitude: 1.5 V
Lead Channel Pacing Threshold Pulse Width: 0.4 ms
Lead Channel Sensing Intrinsic Amplitude: 10.375 mV
Lead Channel Setting Pacing Amplitude: 3 V
Lead Channel Setting Pacing Amplitude: 3 V
Lead Channel Setting Pacing Pulse Width: 0.8 ms
MDC IDC LEAD IMPLANT DT: 20010126
MDC IDC LEAD IMPLANT DT: 20100806
MDC IDC LEAD LOCATION: 753858
MDC IDC LEAD LOCATION: 753860
MDC IDC LEAD MODEL: 4194
MDC IDC MSMT BATTERY VOLTAGE: 2.91 V
MDC IDC MSMT LEADCHNL LV IMPEDANCE VALUE: 323 Ohm
MDC IDC MSMT LEADCHNL LV IMPEDANCE VALUE: 722 Ohm
MDC IDC MSMT LEADCHNL LV PACING THRESHOLD AMPLITUDE: 1.875 V
MDC IDC MSMT LEADCHNL LV PACING THRESHOLD PULSEWIDTH: 0.8 ms
MDC IDC MSMT LEADCHNL RA SENSING INTR AMPL: 0.625 mV
MDC IDC MSMT LEADCHNL RA SENSING INTR AMPL: 0.625 mV
MDC IDC MSMT LEADCHNL RV IMPEDANCE VALUE: 513 Ohm
MDC IDC MSMT LEADCHNL RV IMPEDANCE VALUE: 665 Ohm
MDC IDC MSMT LEADCHNL RV SENSING INTR AMPL: 10.375 mV
MDC IDC SET LEADCHNL RA PACING AMPLITUDE: 2 V
MDC IDC SET LEADCHNL RV PACING PULSEWIDTH: 0.4 ms
MDC IDC SET LEADCHNL RV SENSING SENSITIVITY: 0.3 mV
MDC IDC STAT BRADY AP VP PERCENT: 0.39 %
MDC IDC STAT BRADY AS VP PERCENT: 99.44 %
MDC IDC STAT BRADY AS VS PERCENT: 0.17 %
MDC IDC STAT BRADY RA PERCENT PACED: 0.39 %

## 2015-10-07 NOTE — Telephone Encounter (Signed)
Ms. Rossow aware transmission was received.

## 2015-10-07 NOTE — Telephone Encounter (Signed)
New message ° ° ° ° ° ° °Did we get patient's remote transmission? °

## 2015-10-15 LAB — TSH: TSH: 7.079 u[IU]/mL — ABNORMAL HIGH (ref 0.350–4.500)

## 2015-10-15 LAB — T4, FREE: FREE T4: 1.5 ng/dL (ref 0.80–1.80)

## 2015-10-18 ENCOUNTER — Other Ambulatory Visit: Payer: Self-pay

## 2015-10-18 DIAGNOSIS — R7989 Other specified abnormal findings of blood chemistry: Secondary | ICD-10-CM

## 2015-11-08 ENCOUNTER — Other Ambulatory Visit: Payer: Self-pay | Admitting: *Deleted

## 2015-11-08 DIAGNOSIS — I472 Ventricular tachycardia, unspecified: Secondary | ICD-10-CM

## 2015-11-08 MED ORDER — AMIODARONE HCL 200 MG PO TABS: 200.0000 mg | ORAL_TABLET | Freq: Every day | ORAL | Status: DC

## 2015-11-10 ENCOUNTER — Ambulatory Visit (INDEPENDENT_AMBULATORY_CARE_PROVIDER_SITE_OTHER): Payer: Medicare Other

## 2015-11-10 DIAGNOSIS — Z5181 Encounter for therapeutic drug level monitoring: Secondary | ICD-10-CM

## 2015-11-10 DIAGNOSIS — Z7901 Long term (current) use of anticoagulants: Secondary | ICD-10-CM | POA: Diagnosis not present

## 2015-11-10 DIAGNOSIS — I482 Chronic atrial fibrillation, unspecified: Secondary | ICD-10-CM

## 2015-11-10 DIAGNOSIS — I4891 Unspecified atrial fibrillation: Secondary | ICD-10-CM

## 2015-11-10 DIAGNOSIS — I6350 Cerebral infarction due to unspecified occlusion or stenosis of unspecified cerebral artery: Secondary | ICD-10-CM

## 2015-11-10 LAB — POCT INR: INR: 2.5

## 2015-11-14 ENCOUNTER — Telehealth: Payer: Self-pay | Admitting: Cardiology

## 2015-11-14 MED ORDER — ALPRAZOLAM 0.5 MG PO TABS: 0.5000 mg | ORAL_TABLET | Freq: Three times a day (TID) | ORAL | Status: DC | PRN

## 2015-11-14 NOTE — Telephone Encounter (Signed)
Xanax refill called to pharmacy. 

## 2015-11-14 NOTE — Telephone Encounter (Signed)
°*  STAT* If patient is at the pharmacy, call can be transferred to refill team.   1. Which medications need to be refilled? (please list name of each medication and dose if known) Alprazolam   2. Which pharmacy/location (including street and city if local pharmacy) is medication to be sent to?[facility] on [address]  3. Do they need a 30 day or 90 day supply? 90

## 2015-11-14 NOTE — Addendum Note (Signed)
Addended by: Meda Klinefelter D on: 11/14/2015 06:14 PM   Modules accepted: Orders

## 2015-11-14 NOTE — Telephone Encounter (Signed)
OK to refill Xanax as before. A 90 day supply is fine with refill. 0.5 mg tid prn.  Ilian Wessell [name] MD, [facility]

## 2015-12-04 ENCOUNTER — Other Ambulatory Visit: Payer: Self-pay | Admitting: Cardiology

## 2015-12-22 ENCOUNTER — Telehealth: Payer: Self-pay | Admitting: Cardiology

## 2015-12-22 NOTE — Telephone Encounter (Signed)
Want you to call something in for his cough,he can not take everything with his condition.

## 2015-12-22 NOTE — Telephone Encounter (Signed)
Returned call to patient's wife.She stated patient has non productive cough.Advised he can take Plain Mucinex or Robitussin.

## 2016-01-02 ENCOUNTER — Other Ambulatory Visit: Payer: Self-pay | Admitting: Cardiology

## 2016-01-02 NOTE — Telephone Encounter (Signed)
Rx request sent to pharmacy.  

## 2016-01-03 ENCOUNTER — Ambulatory Visit (INDEPENDENT_AMBULATORY_CARE_PROVIDER_SITE_OTHER): Payer: Medicare Other | Admitting: *Deleted

## 2016-01-03 ENCOUNTER — Telehealth: Payer: Self-pay | Admitting: Cardiology

## 2016-01-03 DIAGNOSIS — I5022 Chronic systolic (congestive) heart failure: Secondary | ICD-10-CM

## 2016-01-03 DIAGNOSIS — I255 Ischemic cardiomyopathy: Secondary | ICD-10-CM | POA: Diagnosis not present

## 2016-01-03 DIAGNOSIS — I472 Ventricular tachycardia: Secondary | ICD-10-CM | POA: Diagnosis not present

## 2016-01-03 NOTE — Telephone Encounter (Signed)
Confirmed remote transmission w/ pt wife.   

## 2016-01-04 ENCOUNTER — Encounter: Payer: Self-pay | Admitting: Cardiology

## 2016-01-04 ENCOUNTER — Telehealth: Payer: Self-pay | Admitting: Internal Medicine

## 2016-01-04 NOTE — Telephone Encounter (Signed)
Spoke w/ pt wife and informed her that pt remote transmission was received. She verbalized understanding.

## 2016-01-04 NOTE — Telephone Encounter (Signed)
New Message   Pt calling to speak w/ Devce- if signal was sent successfully. Please call back and discuss.

## 2016-01-05 ENCOUNTER — Encounter (INDEPENDENT_AMBULATORY_CARE_PROVIDER_SITE_OTHER): Payer: Medicare Other

## 2016-01-05 DIAGNOSIS — I4891 Unspecified atrial fibrillation: Secondary | ICD-10-CM

## 2016-01-05 DIAGNOSIS — Z7901 Long term (current) use of anticoagulants: Secondary | ICD-10-CM

## 2016-01-05 DIAGNOSIS — I6350 Cerebral infarction due to unspecified occlusion or stenosis of unspecified cerebral artery: Secondary | ICD-10-CM

## 2016-01-06 NOTE — Progress Notes (Signed)
Remote ICD transmission.   

## 2016-01-09 ENCOUNTER — Encounter: Payer: Self-pay | Admitting: Cardiology

## 2016-01-09 ENCOUNTER — Ambulatory Visit (INDEPENDENT_AMBULATORY_CARE_PROVIDER_SITE_OTHER): Payer: Medicare Other | Admitting: Cardiology

## 2016-01-09 ENCOUNTER — Ambulatory Visit (INDEPENDENT_AMBULATORY_CARE_PROVIDER_SITE_OTHER): Payer: Medicare Other | Admitting: *Deleted

## 2016-01-09 VITALS — BP 90/64 | HR 62 | Ht 65.0 in | Wt 137.0 lb

## 2016-01-09 DIAGNOSIS — E039 Hypothyroidism, unspecified: Secondary | ICD-10-CM | POA: Insufficient documentation

## 2016-01-09 DIAGNOSIS — I482 Chronic atrial fibrillation, unspecified: Secondary | ICD-10-CM

## 2016-01-09 DIAGNOSIS — I472 Ventricular tachycardia, unspecified: Secondary | ICD-10-CM

## 2016-01-09 DIAGNOSIS — I5022 Chronic systolic (congestive) heart failure: Secondary | ICD-10-CM

## 2016-01-09 DIAGNOSIS — I255 Ischemic cardiomyopathy: Secondary | ICD-10-CM | POA: Diagnosis not present

## 2016-01-09 DIAGNOSIS — Z95810 Presence of automatic (implantable) cardiac defibrillator: Secondary | ICD-10-CM

## 2016-01-09 DIAGNOSIS — Z5181 Encounter for therapeutic drug level monitoring: Secondary | ICD-10-CM

## 2016-01-09 LAB — CUP PACEART REMOTE DEVICE CHECK
Brady Statistic AP VS Percent: 0 %
Brady Statistic AS VP Percent: 99.61 %
Brady Statistic RA Percent Paced: 0.32 %
Brady Statistic RV Percent Paced: 99.86 %
HIGH POWER IMPEDANCE MEASURED VALUE: 47 Ohm
HighPow Impedance: 61 Ohm
Implantable Lead Implant Date: 20010126
Implantable Lead Location: 753858
Implantable Lead Location: 753859
Implantable Lead Location: 753860
Implantable Lead Model: 5076
Lead Channel Impedance Value: 285 Ohm
Lead Channel Impedance Value: 437 Ohm
Lead Channel Impedance Value: 608 Ohm
Lead Channel Impedance Value: 608 Ohm
Lead Channel Impedance Value: 722 Ohm
Lead Channel Pacing Threshold Amplitude: 2.125 V
Lead Channel Sensing Intrinsic Amplitude: 1.125 mV
Lead Channel Sensing Intrinsic Amplitude: 9.75 mV
Lead Channel Setting Pacing Amplitude: 2 V
Lead Channel Setting Pacing Amplitude: 2.5 V
Lead Channel Setting Pacing Pulse Width: 0.4 ms
Lead Channel Setting Sensing Sensitivity: 0.3 mV
MDC IDC LEAD IMPLANT DT: 20010126
MDC IDC LEAD IMPLANT DT: 20100806
MDC IDC LEAD MODEL: 4194
MDC IDC LEAD MODEL: 6944
MDC IDC MSMT BATTERY REMAINING LONGEVITY: 18 mo
MDC IDC MSMT BATTERY VOLTAGE: 2.9 V
MDC IDC MSMT LEADCHNL LV IMPEDANCE VALUE: 760 Ohm
MDC IDC MSMT LEADCHNL LV PACING THRESHOLD PULSEWIDTH: 0.8 ms
MDC IDC MSMT LEADCHNL RA SENSING INTR AMPL: 1.125 mV
MDC IDC MSMT LEADCHNL RV PACING THRESHOLD AMPLITUDE: 0.625 V
MDC IDC MSMT LEADCHNL RV PACING THRESHOLD PULSEWIDTH: 0.4 ms
MDC IDC MSMT LEADCHNL RV SENSING INTR AMPL: 9.75 mV
MDC IDC SESS DTM: 20170125202611
MDC IDC SET LEADCHNL LV PACING AMPLITUDE: 3.25 V
MDC IDC SET LEADCHNL LV PACING PULSEWIDTH: 0.8 ms
MDC IDC STAT BRADY AP VP PERCENT: 0.32 %
MDC IDC STAT BRADY AS VS PERCENT: 0.07 %

## 2016-01-09 LAB — POCT INR: INR: 4.2

## 2016-01-09 NOTE — Progress Notes (Signed)
[name] [name] Date of Birth: [birth year] Medical Record #[id number]  History of Present Illness: [name] [name]  is seen today for followup CHF.  He has a history of coronary disease and is status post CABG in 2001. This included an LIMA graft to the LAD, saphenous vein graft to the first obtuse marginal vessel, and saphenous vein graft to the PDA. Cardiac catheterization in 2005 showed patent grafts. He has a history of chronic atrial fibrillation. He also has a history of ventricular tachycardia and has a biventricular ICD. This was revised in December 2013. He has ischemic cardiomyopathy with ejection fraction of 20-25% by Echo in June 2015. He does have chronic kidney disease and is followed by nephrology. In July 2015 he received an ICD shock and was started on amiodarone. His last ICD check on January 04 2016 was Normal with normal Optivol. About 3 weeks ago he developed an upper respiratory infection with cough productive of yellow phlegm. No fever or chills. Some clear sinus drainage. He has taken OTC cough medicine and now cough has cleared. During this time his appetite was reduced. Otherwise he is eating normally. Last August he was noted to be hypothyroid and was started on Synthroid 50 mcg daily. In Nov TSH dropped from 10>>7 with normal free T4. He has lost 19 lbs since August and his daughter is concerned this is related to his thyroid medication.   Current Outpatient Prescriptions on File Prior to Visit  Medication Sig Dispense Refill  . allopurinol (ZYLOPRIM) 100 MG tablet TAKE 1 TABLET BY MOUTH EVERY DAY 30 tablet 0  . ALPRAZolam (XANAX) 0.5 MG tablet Take 1 tablet (0.5 mg total) by mouth 3 (three) times daily as needed for anxiety. 90 tablet 1  . amiodarone (PACERONE) 200 MG tablet Take 1 tablet (200 mg total) by mouth daily. 30 tablet 10  . calcitRIOL (ROCALTROL) 0.25 MCG capsule Take 0.25 mcg by mouth daily.     . carvedilol (COREG) 25 MG tablet Take 1 tablet (25 mg total) by mouth 2  (two) times daily. 60 tablet 6  . digoxin (LANOXIN) 0.125 MG tablet Take 1/2 tablet daily 45 tablet 1  . furosemide (LASIX) 40 MG tablet Take 1 tablet (40 mg total) by mouth daily. Take 1 tablets in the morning and 1 tablet in the evening prn 180 tablet 3  . levothyroxine (SYNTHROID) 50 MCG tablet Take 1 tablet (50 mcg total) by mouth daily before breakfast. 30 tablet 6  . lovastatin (MEVACOR) 40 MG tablet Take 1 tablet (40 mg total) by mouth at bedtime. 90 tablet 3  . Multiple Vitamins-Minerals (PRESERVISION AREDS 2) CAPS Take 1 capsule by mouth 2 (two) times daily.    . potassium chloride (K-DUR) 10 MEQ tablet Take 1 tablet (10 mEq total) by mouth daily. 90 tablet 3  . vitamin B-12 (CYANOCOBALAMIN) 100 MCG tablet Take 50 mcg by mouth daily.    Marland Kitchen warfarin (COUMADIN) 1 MG tablet TAKE 1 TABLET BY MOUTH EVERY DAY OR AS DIRECTED BY COUMADIN CLINIC 30 tablet 3   No current facility-administered medications on file prior to visit.    Allergies  Allergen Reactions  . Nitroglycerin Other (See Comments)    Became severely hypotensive  . Other     Intolerance to ACE inhibitors UNKNOWN REACTION    Past Medical History  Diagnosis Date  . Coronary artery disease   . Cardiomyopathy, ischemic     EF of 30-35%  . Status post CVA   . VT (ventricular  tachycardia) (HCC) aug of 2010    removed old ICD and replaced with biventricular ICD  . CKD (chronic kidney disease) stage 3, GFR 30-59 ml/min   . Atrial fibrillation (HCC)     chronic  . Hypertension   . Dyslipidemia   . Peripheral vascular disease (HCC)   . Thyroid disease     hypo,due to amiodarone therapy,resolved  . Myocardial infarction [facility]) January 2001     History of anteroseptal myocardial infarction presenting with left bundle-branch block  . Anxiety   . Tobacco abuse     History of 2 pack per day tobacco habituation  . Pacemaker   . CHF (congestive heart failure) [facility])     Past Surgical History  Procedure Laterality Date  .  Cholecystectomy    . Femoral bypass      fem-fem bpg, right fem pop bpg  . Icd  07/15/2009    implant Dr. Ladona Ridgel  . Coronary artery bypass graft  2001    x 4  . Cardiac catheterization  2001    PTCA  . Cardiac catheterization  2005    patent grafts  . Cataracts  2006    right eye  . Implantable cardioverter defibrillator (icd) generator change N/A 11/28/2012    Procedure: ICD GENERATOR CHANGE;  Surgeon: Marinus Maw, MD;  Location[facility] CATH LAB;  Service: Cardiovascular;  Laterality: N/A;    History  Smoking status  . Former Smoker  . Quit date: 09/30/2000  Smokeless tobacco  . Not on file    History  Alcohol Use: Not on file    Family History  Problem Relation Age of Onset  . Heart disease Mother   . Aneurysm Brother   . Stroke Mother   . Stroke Father     Review of Systems: As noted in history of present illness. All other systems were reviewed and are negative.  Physical Exam: BP 90/64 mmHg  Pulse 62  Ht 5\' 5"  (1.651 m)  Wt 62.143 kg (137 lb)  BMI 22.80 kg/m2 He is a pleasant white male who appears chronically ill. HEENT exam is unremarkable. Neck is supple no JVD, adenopathy, thyromegaly, or bruits. Lungs are clear. His ICD site is stable. Cardiac exam reveals a regular rate and rhythm without gallop, murmur, or click. Abdomen is soft and nontender. There are no masses or bruits. Extremities are without edema. Pedal pulses are good. Skin is warm and dry. He is alert and oriented x3. Cranial nerves II through XII are intact.  LABORATORY DATA: Ecg today shows ventricular paced rhythm with rate 62. I have personally reviewed and interpreted this study.    Assessment / Plan: 1. Chronic systolic congestive heart failure. EF 20-25%.  He is well compensated. Lungs are clear and he has no edema. We will continue with his current medication including carvedilol, digoxin, and Lasix. Avoid ACE inhibitor/ARB due to to renal insufficiency. I will check Dig level, CMET, and  CBC today.   He is BiV paced.   2. History of ventricular tachycardia. Status post biventricular ICD. ICD shock in July 2015. Now on amiodarone. No recurrent shocks on amiodarone.  3. Chronic atrial fibrillation. He is on anticoagulation with coumadin.  Rate is very well controlled. Adjust coumadin dose per pharmacy.  4. Remote CVA.  5. Chronic kidney disease stage III. Stable.  6. Coronary disease status post CABG. No recurrent angina.  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 Now on synthroid. I am also concerned about his weight loss but  I am not sure if this is related to thyroid or his recent URI. Will check TFTs today.

## 2016-01-09 NOTE — Patient Instructions (Signed)
We will check lab work today  Adjust coumadin as per pharmacy  We will see you in 3 months.

## 2016-01-10 ENCOUNTER — Other Ambulatory Visit: Payer: Self-pay

## 2016-01-10 DIAGNOSIS — R634 Abnormal weight loss: Secondary | ICD-10-CM

## 2016-01-10 DIAGNOSIS — R748 Abnormal levels of other serum enzymes: Secondary | ICD-10-CM

## 2016-01-10 LAB — COMPREHENSIVE METABOLIC PANEL
ALBUMIN: 3.6 g/dL (ref 3.6–5.1)
ALT: 54 U/L — ABNORMAL HIGH (ref 9–46)
AST: 75 U/L — AB (ref 10–35)
Alkaline Phosphatase: 159 U/L — ABNORMAL HIGH (ref 40–115)
BILIRUBIN TOTAL: 0.9 mg/dL (ref 0.2–1.2)
BUN: 36 mg/dL — AB (ref 7–25)
CALCIUM: 9.3 mg/dL (ref 8.6–10.3)
CHLORIDE: 99 mmol/L (ref 98–110)
CO2: 25 mmol/L (ref 20–31)
CREATININE: 1.94 mg/dL — AB (ref 0.70–1.18)
Glucose, Bld: 123 mg/dL — ABNORMAL HIGH (ref 65–99)
POTASSIUM: 4.5 mmol/L (ref 3.5–5.3)
Sodium: 135 mmol/L (ref 135–146)
Total Protein: 6.7 g/dL (ref 6.1–8.1)

## 2016-01-10 LAB — CBC WITH DIFFERENTIAL/PLATELET
Basophils Absolute: 0 10*3/uL (ref 0.0–0.1)
Basophils Relative: 0 % (ref 0–1)
EOS PCT: 1 % (ref 0–5)
Eosinophils Absolute: 0.1 10*3/uL (ref 0.0–0.7)
HEMATOCRIT: 44.7 % (ref 39.0–52.0)
Hemoglobin: 14.5 g/dL (ref 13.0–17.0)
LYMPHS PCT: 18 % (ref 12–46)
Lymphs Abs: 1.7 10*3/uL (ref 0.7–4.0)
MCH: 30.4 pg (ref 26.0–34.0)
MCHC: 32.4 g/dL (ref 30.0–36.0)
MCV: 93.7 fL (ref 78.0–100.0)
MONO ABS: 0.9 10*3/uL (ref 0.1–1.0)
MONOS PCT: 10 % (ref 3–12)
MPV: 11.1 fL (ref 8.6–12.4)
Neutro Abs: 6.7 10*3/uL (ref 1.7–7.7)
Neutrophils Relative %: 71 % (ref 43–77)
Platelets: 237 10*3/uL (ref 150–400)
RBC: 4.77 MIL/uL (ref 4.22–5.81)
RDW: 15 % (ref 11.5–15.5)
WBC: 9.4 10*3/uL (ref 4.0–10.5)

## 2016-01-10 LAB — DIGOXIN LEVEL: DIGOXIN LVL: 1.5 ug/L (ref 0.8–2.0)

## 2016-01-10 LAB — T4, FREE: Free T4: 1.49 ng/dL (ref 0.80–1.80)

## 2016-01-10 LAB — TSH: TSH: 4.983 u[IU]/mL — ABNORMAL HIGH (ref 0.350–4.500)

## 2016-01-11 ENCOUNTER — Telehealth: Payer: Self-pay | Admitting: Cardiology

## 2016-01-11 ENCOUNTER — Encounter: Payer: Self-pay | Admitting: Cardiology

## 2016-01-11 NOTE — Telephone Encounter (Signed)
New message      Pt cannot remember which medication he is to stop or reduce taking.  Please call

## 2016-01-11 NOTE — Telephone Encounter (Signed)
Returned call to patient's daughter Harriett Sine.Advised decrease digoxin 0.125 mg 1/2 every other day.

## 2016-01-13 ENCOUNTER — Ambulatory Visit (HOSPITAL_COMMUNITY): Payer: Medicare Other

## 2016-01-19 ENCOUNTER — Ambulatory Visit (INDEPENDENT_AMBULATORY_CARE_PROVIDER_SITE_OTHER): Payer: Medicare Other | Admitting: *Deleted

## 2016-01-19 DIAGNOSIS — Z7901 Long term (current) use of anticoagulants: Secondary | ICD-10-CM | POA: Diagnosis not present

## 2016-01-19 DIAGNOSIS — Z5181 Encounter for therapeutic drug level monitoring: Secondary | ICD-10-CM

## 2016-01-19 DIAGNOSIS — I4891 Unspecified atrial fibrillation: Secondary | ICD-10-CM

## 2016-01-19 DIAGNOSIS — I6350 Cerebral infarction due to unspecified occlusion or stenosis of unspecified cerebral artery: Secondary | ICD-10-CM | POA: Diagnosis not present

## 2016-01-19 DIAGNOSIS — I482 Chronic atrial fibrillation, unspecified: Secondary | ICD-10-CM

## 2016-01-19 LAB — POCT INR: INR: 2

## 2016-01-20 ENCOUNTER — Ambulatory Visit (HOSPITAL_COMMUNITY)
Admission: RE | Admit: 2016-01-20 | Discharge: 2016-01-20 | Disposition: A | Discharge status: Home / Self Care | Payer: Medicare Other | Source: Ambulatory Visit | Attending: Cardiology | Admitting: Cardiology

## 2016-01-20 ENCOUNTER — Telehealth: Payer: Self-pay

## 2016-01-20 DIAGNOSIS — R634 Abnormal weight loss: Secondary | ICD-10-CM | POA: Diagnosis not present

## 2016-01-20 DIAGNOSIS — Z9049 Acquired absence of other specified parts of digestive tract: Secondary | ICD-10-CM | POA: Insufficient documentation

## 2016-01-20 DIAGNOSIS — N261 Atrophy of kidney (terminal): Secondary | ICD-10-CM | POA: Insufficient documentation

## 2016-01-20 DIAGNOSIS — R748 Abnormal levels of other serum enzymes: Secondary | ICD-10-CM | POA: Insufficient documentation

## 2016-01-20 DIAGNOSIS — I714 Abdominal aortic aneurysm, without rupture: Secondary | ICD-10-CM | POA: Diagnosis not present

## 2016-01-20 MED ORDER — ALPRAZOLAM 0.5 MG PO TABS: 0.5000 mg | ORAL_TABLET | Freq: Three times a day (TID) | ORAL | Status: DC | PRN

## 2016-01-20 NOTE — Telephone Encounter (Signed)
Alprazolam 0.5 mg refill phoned into [facility].

## 2016-01-23 ENCOUNTER — Telehealth: Payer: Self-pay | Admitting: Cardiology

## 2016-01-23 NOTE — Telephone Encounter (Signed)
Advice communicated, pt verbalized understanding.

## 2016-01-23 NOTE — Telephone Encounter (Signed)
[name] [name] is calling to get the [name] test results , Please call   Thanks

## 2016-01-23 NOTE — Telephone Encounter (Signed)
Yes continue with reduced dose of Dig  Peter [name] MD, [facility]

## 2016-01-23 NOTE — Telephone Encounter (Signed)
Results of Abd [place] given - understanding verbalized. Wife asking what to do about digoxin - keep at new dose? (0.0625mg  every other day)  Notes Recorded by Peter M [name], MD on 01/10/2016 at 8:46 AM CBC is normal. Thyroid function is good. Renal indices are stable if not improved. LFTs are elevated. DIG level is OK but I would reduce digoxin to 0.0625 mg every other day. Elevated LFTs could be related to recent virus but given recent weight loss I would recommend an abdominal US to evaluate

## 2016-01-31 ENCOUNTER — Other Ambulatory Visit: Payer: Self-pay | Admitting: Cardiology

## 2016-01-31 DIAGNOSIS — I472 Ventricular tachycardia, unspecified: Secondary | ICD-10-CM

## 2016-01-31 MED ORDER — POTASSIUM CHLORIDE ER 10 MEQ PO TBCR: 10.0000 meq | EXTENDED_RELEASE_TABLET | Freq: Every day | ORAL | Status: AC

## 2016-01-31 MED ORDER — DIGOXIN 125 MCG PO TABS: ORAL_TABLET | ORAL | Status: DC

## 2016-01-31 MED ORDER — LOVASTATIN 40 MG PO TABS: 40.0000 mg | ORAL_TABLET | Freq: Every day | ORAL | Status: DC

## 2016-01-31 MED ORDER — AMIODARONE HCL 200 MG PO TABS: 200.0000 mg | ORAL_TABLET | Freq: Every day | ORAL | Status: DC

## 2016-01-31 MED ORDER — WARFARIN SODIUM 1 MG PO TABS: ORAL_TABLET | ORAL | Status: DC

## 2016-01-31 MED ORDER — CARVEDILOL 25 MG PO TABS: 25.0000 mg | ORAL_TABLET | Freq: Two times a day (BID) | ORAL | Status: DC

## 2016-01-31 MED ORDER — FUROSEMIDE 40 MG PO TABS: 40.0000 mg | ORAL_TABLET | Freq: Two times a day (BID) | ORAL | Status: DC

## 2016-01-31 NOTE — Telephone Encounter (Signed)
Refill done as requested 

## 2016-01-31 NOTE — Telephone Encounter (Signed)
Returned call to patient's wife requesting 90 day refills be sent to [facility].Refills sent to pharmacy.Message sent to coumadin clinic for 90 day refill for coumadin.

## 2016-01-31 NOTE — Telephone Encounter (Signed)
New message      Pt has a new pharmacy.  [facility] presc [facility] [place]  Please change in computer

## 2016-01-31 NOTE — Telephone Encounter (Signed)
Left message that refill  Had been done and sent to [facility] as requested

## 2016-02-02 ENCOUNTER — Ambulatory Visit (INDEPENDENT_AMBULATORY_CARE_PROVIDER_SITE_OTHER): Payer: Medicare Other | Admitting: *Deleted

## 2016-02-02 DIAGNOSIS — Z7901 Long term (current) use of anticoagulants: Secondary | ICD-10-CM | POA: Diagnosis not present

## 2016-02-02 DIAGNOSIS — I6350 Cerebral infarction due to unspecified occlusion or stenosis of unspecified cerebral artery: Secondary | ICD-10-CM | POA: Diagnosis not present

## 2016-02-02 DIAGNOSIS — I4891 Unspecified atrial fibrillation: Secondary | ICD-10-CM

## 2016-02-02 DIAGNOSIS — Z5181 Encounter for therapeutic drug level monitoring: Secondary | ICD-10-CM

## 2016-02-02 DIAGNOSIS — I482 Chronic atrial fibrillation, unspecified: Secondary | ICD-10-CM

## 2016-02-02 LAB — POCT INR: INR: 2.3

## 2016-02-21 ENCOUNTER — Other Ambulatory Visit: Payer: Self-pay | Admitting: Cardiology

## 2016-02-21 NOTE — Telephone Encounter (Signed)
REFILL 

## 2016-02-29 ENCOUNTER — Other Ambulatory Visit: Payer: Self-pay | Admitting: *Deleted

## 2016-02-29 MED ORDER — LEVOTHYROXINE SODIUM 50 MCG PO TABS: 50.0000 ug | ORAL_TABLET | Freq: Every day | ORAL | Status: DC

## 2016-03-05 ENCOUNTER — Telehealth: Payer: Self-pay | Admitting: Internal Medicine

## 2016-03-05 ENCOUNTER — Telehealth: Payer: Self-pay | Admitting: Cardiology

## 2016-03-05 NOTE — Telephone Encounter (Signed)
Spoke to daughter she stated father has no swelling in feet.Stated he feels like heart in rhythm.Weight stable.Stated since father had flu in January he stays tired and sob.Advised to call PCP.

## 2016-03-05 NOTE — Telephone Encounter (Signed)
Returned call to patient's daughter.She stated her mother has noticed father is sob and wanted to know if Dr.Taylor would order O2 at his appointment on Fri 03/09/16.Advised to call PCP.

## 2016-03-05 NOTE — Telephone Encounter (Signed)
New message  Pt called req to have cheryl call back. Very important. No further details provided

## 2016-03-05 NOTE — Telephone Encounter (Signed)
New Message  Pt wife requested to speak w/ RN- stated that pt is c/o weakness- Please call back and discuss.

## 2016-03-05 NOTE — Telephone Encounter (Signed)
Returned call to patient's wife.She stated husband has appointment with PCP Dr.Burnette Wed 03/07/16.Stated she was concerned if he ordered any test results would not be available for Dr.Taylor's appointment 03/09/16.Advised to let PCP know he has appointment with Dr.Taylor.

## 2016-03-09 ENCOUNTER — Telehealth: Payer: Self-pay | Admitting: Cardiology

## 2016-03-09 ENCOUNTER — Encounter: Payer: Self-pay | Admitting: Internal Medicine

## 2016-03-09 ENCOUNTER — Ambulatory Visit (INDEPENDENT_AMBULATORY_CARE_PROVIDER_SITE_OTHER): Payer: Medicare Other | Admitting: *Deleted

## 2016-03-09 ENCOUNTER — Ambulatory Visit (INDEPENDENT_AMBULATORY_CARE_PROVIDER_SITE_OTHER): Payer: Medicare Other | Admitting: Internal Medicine

## 2016-03-09 ENCOUNTER — Telehealth: Payer: Self-pay | Admitting: Internal Medicine

## 2016-03-09 VITALS — BP 92/54 | HR 66 | Ht 65.5 in | Wt 137.8 lb

## 2016-03-09 DIAGNOSIS — I482 Chronic atrial fibrillation, unspecified: Secondary | ICD-10-CM

## 2016-03-09 DIAGNOSIS — I4891 Unspecified atrial fibrillation: Secondary | ICD-10-CM

## 2016-03-09 DIAGNOSIS — Z7901 Long term (current) use of anticoagulants: Secondary | ICD-10-CM

## 2016-03-09 DIAGNOSIS — I6350 Cerebral infarction due to unspecified occlusion or stenosis of unspecified cerebral artery: Secondary | ICD-10-CM | POA: Diagnosis not present

## 2016-03-09 DIAGNOSIS — I5022 Chronic systolic (congestive) heart failure: Secondary | ICD-10-CM | POA: Diagnosis not present

## 2016-03-09 DIAGNOSIS — Z5181 Encounter for therapeutic drug level monitoring: Secondary | ICD-10-CM

## 2016-03-09 LAB — CUP PACEART INCLINIC DEVICE CHECK
Date Time Interrogation Session: 20170331111050
HighPow Impedance: 42 Ohm
Implantable Lead Implant Date: 20010126
Implantable Lead Location: 753858
Implantable Lead Location: 753859
Implantable Lead Location: 753860
Implantable Lead Model: 6944
Lead Channel Impedance Value: 399 Ohm
Lead Channel Impedance Value: 551 Ohm
Lead Channel Pacing Threshold Pulse Width: 0.8 ms
Lead Channel Sensing Intrinsic Amplitude: 10.4 mV
Lead Channel Setting Pacing Amplitude: 2.5 V
Lead Channel Setting Pacing Pulse Width: 0.8 ms
Lead Channel Setting Sensing Sensitivity: 0.3 mV
MDC IDC LEAD IMPLANT DT: 20010126
MDC IDC LEAD IMPLANT DT: 20100806
MDC IDC LEAD MODEL: 4194
MDC IDC MSMT LEADCHNL LV PACING THRESHOLD AMPLITUDE: 2.25 V
MDC IDC MSMT LEADCHNL RA SENSING INTR AMPL: 0.9 mV
MDC IDC MSMT LEADCHNL RV IMPEDANCE VALUE: 646 Ohm
MDC IDC MSMT LEADCHNL RV PACING THRESHOLD AMPLITUDE: 1.25 V
MDC IDC MSMT LEADCHNL RV PACING THRESHOLD PULSEWIDTH: 0.4 ms
MDC IDC SET LEADCHNL LV PACING AMPLITUDE: 3.25 V
MDC IDC SET LEADCHNL RA PACING AMPLITUDE: 2 V
MDC IDC SET LEADCHNL RV PACING PULSEWIDTH: 0.4 ms

## 2016-03-09 LAB — PROTIME-INR
INR: 5.07 (ref 0.00–1.49)
Prothrombin Time: 45.4 seconds — ABNORMAL HIGH (ref 11.6–15.2)

## 2016-03-09 LAB — POCT INR: INR: 6.9

## 2016-03-09 NOTE — Telephone Encounter (Signed)
FORWARD TO ANTICOAG. POOL AT [facility]

## 2016-03-09 NOTE — Progress Notes (Signed)
HPI [name] [name] returns today for followup. He is a very pleasant 75 year old man with an ischemic cardiomyopathy, chronic atrial fibrillation, complete heart block, ventricular tachycardia, status post ICD insertion.  Since then he has been stable. When I saw him last, he was in NSR after being shocked for VT and was maintaining NSR and felt well. He has reverted back to atrial fib despite amiodarone. Lately, he has had bronchitis but has finally improved. Appetite is good. Allergies  Allergen Reactions  . Nitroglycerin Other (See Comments)    Became severely hypotensive  . Other     Intolerance to ACE inhibitors UNKNOWN REACTION     Current Outpatient Prescriptions  Medication Sig Dispense Refill  . allopurinol (ZYLOPRIM) 100 MG tablet TAKE 1 TABLET BY MOUTH EVERY DAY 30 tablet 0  . ALPRAZolam (XANAX) 0.5 MG tablet Take 1 tablet (0.5 mg total) by mouth 3 (three) times daily as needed for anxiety. 90 tablet 1  . amiodarone (PACERONE) 200 MG tablet Take 1 tablet (200 mg total) by mouth daily. 90 tablet 3  . calcitRIOL (ROCALTROL) 0.25 MCG capsule Take 0.25 mcg by mouth daily.     . carvedilol (COREG) 25 MG tablet Take 1 tablet (25 mg total) by mouth 2 (two) times daily. 180 tablet 3  . digoxin (LANOXIN) 0.125 MG tablet Take 1/2 tablet 0.0625 mg every other day 45 tablet 3  . furosemide (LASIX) 40 MG tablet Take 1 tablet (40 mg total) by mouth 2 (two) times daily. 180 tablet 3  . levothyroxine (SYNTHROID, LEVOTHROID) 50 MCG tablet Take 1 tablet (50 mcg total) by mouth daily before breakfast. 30 tablet 0  . lovastatin (MEVACOR) 40 MG tablet Take 1 tablet (40 mg total) by mouth at bedtime. 90 tablet 3  . Multiple Vitamins-Minerals (PRESERVISION AREDS 2) CAPS Take 1 capsule by mouth 2 (two) times daily.    . potassium chloride (K-DUR) 10 MEQ tablet Take 1 tablet (10 mEq total) by mouth daily. 90 tablet 3  . vitamin B-12 (CYANOCOBALAMIN) 100 MCG tablet Take 50 mcg by mouth daily.    Marland Kitchen warfarin  (COUMADIN) 1 MG tablet Take as directed by coumadin clinic 90 tablet 0   No current facility-administered medications for this visit.     Past Medical History  Diagnosis Date  . Coronary artery disease   . Cardiomyopathy, ischemic     EF of 30-35%  . Status post CVA   . VT (ventricular tachycardia) (HCC) aug of 2010    removed old ICD and replaced with biventricular ICD  . CKD (chronic kidney disease) stage 3, GFR 30-59 ml/min   . Atrial fibrillation (HCC)     chronic  . Hypertension   . Dyslipidemia   . Peripheral vascular disease (HCC)   . Thyroid disease     hypo,due to amiodarone therapy,resolved  . Myocardial infarction [facility]) January 2001     History of anteroseptal myocardial infarction presenting with left bundle-branch block  . Anxiety   . Tobacco abuse     History of 2 pack per day tobacco habituation  . Pacemaker   . CHF (congestive heart failure) (HCC)     ROS:   All systems reviewed and negative except as noted in the HPI.   Past Surgical History  Procedure Laterality Date  . Cholecystectomy    . Femoral bypass      fem-fem bpg, right fem pop bpg  . Icd  07/15/2009    implant Dr. Ladona Ridgel  . Coronary artery bypass  graft  2001    x 4  . Cardiac catheterization  2001    PTCA  . Cardiac catheterization  2005    patent grafts  . Cataracts  2006    right eye  . Implantable cardioverter defibrillator (icd) generator change N/A 11/28/2012    Procedure: ICD GENERATOR CHANGE;  Surgeon: Marinus Maw, MD;  Location: [facility] CATH LAB;  Service: Cardiovascular;  Laterality: N/A;     Family History  Problem Relation Age of Onset  . Heart disease Mother   . Aneurysm Brother   . Stroke Mother   . Stroke Father      Social History   Social History  . Marital Status: Married    Spouse Name: N/A  . Number of Children: 2  . Years of Education: N/A   Occupational History  . Psychologist, prison and probation services    Social History Main Topics  . Smoking status: Former Smoker     Quit date: 09/30/2000  . Smokeless tobacco: Not on file  . Alcohol Use: Not on file  . Drug Use: Not on file  . Sexual Activity: Not on file   Other Topics Concern  . Not on file   Social History Narrative     BP 92/54 mmHg  Pulse 66  Ht 5' 5.5" (1.664 m)  Wt 137 lb 12.8 oz (62.506 kg)  BMI 22.57 kg/m2  Physical Exam:  Well appearing 75 year old man,NAD HEENT: Unremarkable Neck:  6 cm JVD, no thyromegally Lungs:  Clear with no wheezes, rales, or rhonchi. Well-healed ICD incision. HEART:  Regular rate rhythm, no murmurs, no rubs, no clicks Abd:  soft, positive bowel sounds, no organomegally, no rebound, no guarding Ext:  2 plus pulses, no edema, no cyanosis, no clubbing Skin:  No rashes no nodules Neuro:  CN II through XII intact, motor grossly intact   DEVICE  Normal device function.  See PaceArt for details.   Assess/Plan: 1. Atrial fib - his rate is well controlled. 2. VT - he has had no recurrent VT. He will continue amiodarone 3. Chronic systolic heart failure - his symptoms are class 2. He is sedentary and I have encouraged him to increase his activity. 4. ICD - his device is stable. His LV threshold is chronically elevated.  5. Coags - his INR is elevated and he will go back to the lab to be checked.  Leonia Reeves.D.

## 2016-03-09 NOTE — Telephone Encounter (Signed)
Telephoned daughter back to discuss elevated INR reading with her.

## 2016-03-09 NOTE — Patient Instructions (Signed)
Medication Instructions:  Your physician recommends that you continue on your current medications as directed. Please refer to the Current Medication list given to you today.   Labwork: None ordered   Testing/Procedures: None ordered   Follow-Up: Your physician wants you to follow-up in: 12 months with Dr Taylor You will receive a reminder letter in the mail two months in advance. If you don't receive a letter, please call our office to schedule the follow-up appointment.   Remote monitoring is used to monitor your  ICD from home. This monitoring reduces the number of office visits required to check your device to one time per year. It allows us to keep an eye on the functioning of your device to ensure it is working properly. You are scheduled for a device check from home on 06/11/16. You may send your transmission at any time that day. If you have a wireless device, the transmission will be sent automatically. After your physician reviews your transmission, you will receive a postcard with your next transmission date.    Any Other Special Instructions Will Be Listed Below (If Applicable).     If you need a refill on your cardiac medications before your next appointment, please call your pharmacy.   

## 2016-03-09 NOTE — Telephone Encounter (Signed)
New message   Coumadin levels were at a 6.2. Request a call back to discuss. Family is very concerned.

## 2016-03-09 NOTE — Telephone Encounter (Signed)
INR result noted see anticoagulation note in Epic

## 2016-03-12 ENCOUNTER — Other Ambulatory Visit: Payer: Self-pay | Admitting: *Deleted

## 2016-03-12 MED ORDER — LEVOTHYROXINE SODIUM 50 MCG PO TABS: 50.0000 ug | ORAL_TABLET | Freq: Every day | ORAL | Status: DC

## 2016-03-12 MED ORDER — ALLOPURINOL 100 MG PO TABS: 100.0000 mg | ORAL_TABLET | Freq: Every day | ORAL | Status: DC

## 2016-03-13 ENCOUNTER — Telehealth: Payer: Self-pay

## 2016-03-13 MED ORDER — ALPRAZOLAM 0.5 MG PO TABS: 0.5000 mg | ORAL_TABLET | Freq: Three times a day (TID) | ORAL | Status: DC | PRN

## 2016-03-13 MED ORDER — ALLOPURINOL 100 MG PO TABS: 100.0000 mg | ORAL_TABLET | Freq: Every day | ORAL | Status: DC

## 2016-03-13 MED ORDER — LEVOTHYROXINE SODIUM 50 MCG PO TABS: 50.0000 ug | ORAL_TABLET | Freq: Every day | ORAL | Status: AC

## 2016-03-13 NOTE — Telephone Encounter (Signed)
Received prescription request form for allopurinol,alprazolam,levothyroxine.Dr.Jordan signed form.Faxed back to [facility] at fax # [phone].

## 2016-03-16 ENCOUNTER — Ambulatory Visit (INDEPENDENT_AMBULATORY_CARE_PROVIDER_SITE_OTHER): Payer: Medicare Other | Admitting: *Deleted

## 2016-03-16 DIAGNOSIS — Z5181 Encounter for therapeutic drug level monitoring: Secondary | ICD-10-CM

## 2016-03-16 DIAGNOSIS — I482 Chronic atrial fibrillation, unspecified: Secondary | ICD-10-CM

## 2016-03-16 DIAGNOSIS — I6350 Cerebral infarction due to unspecified occlusion or stenosis of unspecified cerebral artery: Secondary | ICD-10-CM

## 2016-03-16 DIAGNOSIS — I4891 Unspecified atrial fibrillation: Secondary | ICD-10-CM

## 2016-03-16 DIAGNOSIS — Z7901 Long term (current) use of anticoagulants: Secondary | ICD-10-CM | POA: Diagnosis not present

## 2016-03-16 LAB — POCT INR: INR: 7.2

## 2016-03-16 LAB — PROTIME-INR
INR: 4.98 — AB (ref 0.00–1.49)
PROTHROMBIN TIME: 44.8 s — AB (ref 11.6–15.2)

## 2016-03-20 ENCOUNTER — Telehealth: Payer: Self-pay

## 2016-03-20 NOTE — Telephone Encounter (Signed)
Received letter from Cyril Loosen RN with [facility] requesting copies of last lab work.Copies of last lab work faxed to her at fax # [phone].

## 2016-03-23 ENCOUNTER — Ambulatory Visit (INDEPENDENT_AMBULATORY_CARE_PROVIDER_SITE_OTHER): Payer: Medicare Other

## 2016-03-23 DIAGNOSIS — Z5181 Encounter for therapeutic drug level monitoring: Secondary | ICD-10-CM | POA: Diagnosis not present

## 2016-03-23 DIAGNOSIS — I482 Chronic atrial fibrillation, unspecified: Secondary | ICD-10-CM

## 2016-03-23 DIAGNOSIS — Z7901 Long term (current) use of anticoagulants: Secondary | ICD-10-CM

## 2016-03-23 DIAGNOSIS — I4891 Unspecified atrial fibrillation: Secondary | ICD-10-CM

## 2016-03-23 DIAGNOSIS — I6350 Cerebral infarction due to unspecified occlusion or stenosis of unspecified cerebral artery: Secondary | ICD-10-CM

## 2016-03-23 LAB — POCT INR: INR: 4.7

## 2016-03-30 ENCOUNTER — Ambulatory Visit (INDEPENDENT_AMBULATORY_CARE_PROVIDER_SITE_OTHER): Payer: Medicare Other | Admitting: *Deleted

## 2016-03-30 DIAGNOSIS — Z5181 Encounter for therapeutic drug level monitoring: Secondary | ICD-10-CM | POA: Diagnosis not present

## 2016-03-30 DIAGNOSIS — I482 Chronic atrial fibrillation, unspecified: Secondary | ICD-10-CM

## 2016-03-30 DIAGNOSIS — I4891 Unspecified atrial fibrillation: Secondary | ICD-10-CM

## 2016-03-30 DIAGNOSIS — I6350 Cerebral infarction due to unspecified occlusion or stenosis of unspecified cerebral artery: Secondary | ICD-10-CM

## 2016-03-30 DIAGNOSIS — Z7901 Long term (current) use of anticoagulants: Secondary | ICD-10-CM | POA: Diagnosis not present

## 2016-03-30 LAB — POCT INR: INR: 2.7

## 2016-04-03 ENCOUNTER — Other Ambulatory Visit: Payer: Self-pay

## 2016-04-03 MED ORDER — CALCITRIOL 0.25 MCG PO CAPS: 0.2500 ug | ORAL_CAPSULE | Freq: Every day | ORAL | Status: DC

## 2016-04-05 ENCOUNTER — Ambulatory Visit (INDEPENDENT_AMBULATORY_CARE_PROVIDER_SITE_OTHER): Payer: Medicare Other | Admitting: Cardiology

## 2016-04-05 ENCOUNTER — Encounter: Payer: Self-pay | Admitting: Cardiology

## 2016-04-05 ENCOUNTER — Ambulatory Visit (INDEPENDENT_AMBULATORY_CARE_PROVIDER_SITE_OTHER): Payer: Medicare Other | Admitting: Pharmacist Clinician (PhC)/ Clinical Pharmacy Specialist

## 2016-04-05 VITALS — BP 86/50 | HR 64 | Ht 65.0 in | Wt 137.4 lb

## 2016-04-05 DIAGNOSIS — I482 Chronic atrial fibrillation, unspecified: Secondary | ICD-10-CM

## 2016-04-05 DIAGNOSIS — I472 Ventricular tachycardia, unspecified: Secondary | ICD-10-CM

## 2016-04-05 DIAGNOSIS — Z95810 Presence of automatic (implantable) cardiac defibrillator: Secondary | ICD-10-CM

## 2016-04-05 DIAGNOSIS — Z5181 Encounter for therapeutic drug level monitoring: Secondary | ICD-10-CM

## 2016-04-05 DIAGNOSIS — I4891 Unspecified atrial fibrillation: Secondary | ICD-10-CM | POA: Diagnosis not present

## 2016-04-05 DIAGNOSIS — Z7901 Long term (current) use of anticoagulants: Secondary | ICD-10-CM

## 2016-04-05 DIAGNOSIS — I5022 Chronic systolic (congestive) heart failure: Secondary | ICD-10-CM

## 2016-04-05 DIAGNOSIS — I6350 Cerebral infarction due to unspecified occlusion or stenosis of unspecified cerebral artery: Secondary | ICD-10-CM | POA: Diagnosis not present

## 2016-04-05 DIAGNOSIS — I25709 Atherosclerosis of coronary artery bypass graft(s), unspecified, with unspecified angina pectoris: Secondary | ICD-10-CM | POA: Diagnosis not present

## 2016-04-05 LAB — POCT INR: INR: 2.9

## 2016-04-05 MED ORDER — CARVEDILOL 25 MG PO TABS: 12.5000 mg | ORAL_TABLET | Freq: Two times a day (BID) | ORAL | Status: DC

## 2016-04-05 NOTE — Patient Instructions (Signed)
Reduce carvedilol to 12.5 mg twice a day  Continue your other therapy  Let me know if swelling increases.

## 2016-04-06 NOTE — Progress Notes (Signed)
[name] [name] Date of Birth: August 25, 1941 Medical Record #[id number]  History of Present Illness: [name] [name]  is seen today for followup CHF.  He has a history of coronary disease and is status post CABG in 2001. This included an LIMA graft to the LAD, saphenous vein graft to the first obtuse marginal vessel, and saphenous vein graft to the PDA. Cardiac catheterization in 2005 showed patent grafts. He has a history of chronic atrial fibrillation. He also has a history of ventricular tachycardia and has a biventricular ICD. This was revised in December 2013. He has ischemic cardiomyopathy with ejection fraction of 20-25% by Echo in June 2015. He does have chronic kidney disease and is followed by nephrology. In July 2015 he received an ICD shock and was started on amiodarone. His last ICD check on March 09, 2016 was Normal with normal Optivol. On follow up today he reports some increased swelling in his feet. No chest pain or dyspnea. He is walking and doing home resistance exercises. He is eating much more with improved appetite but is still avoiding salt. Overall feels OK. Weight is unchanged.   Current Outpatient Prescriptions on File Prior to Visit  Medication Sig Dispense Refill  . allopurinol (ZYLOPRIM) 100 MG tablet Take 1 tablet (100 mg total) by mouth daily. 90 tablet 3  . ALPRAZolam (XANAX) 0.5 MG tablet Take 1 tablet (0.5 mg total) by mouth 3 (three) times daily as needed for anxiety. 90 tablet 1  . amiodarone (PACERONE) 200 MG tablet Take 1 tablet (200 mg total) by mouth daily. 90 tablet 3  . calcitRIOL (ROCALTROL) 0.25 MCG capsule Take 1 capsule (0.25 mcg total) by mouth daily. 90 capsule 1  . digoxin (LANOXIN) 0.125 MG tablet Take 1/2 tablet 0.0625 mg every other day 45 tablet 3  . furosemide (LASIX) 40 MG tablet Take 1 tablet (40 mg total) by mouth 2 (two) times daily. 180 tablet 3  . levothyroxine (SYNTHROID, LEVOTHROID) 50 MCG tablet Take 1 tablet (50 mcg total) by mouth daily before  breakfast. 90 tablet 3  . lovastatin (MEVACOR) 40 MG tablet Take 1 tablet (40 mg total) by mouth at bedtime. 90 tablet 3  . Multiple Vitamins-Minerals (PRESERVISION AREDS 2) CAPS Take 1 capsule by mouth 2 (two) times daily.    . potassium chloride (K-DUR) 10 MEQ tablet Take 1 tablet (10 mEq total) by mouth daily. 90 tablet 3  . vitamin B-12 (CYANOCOBALAMIN) 100 MCG tablet Take 50 mcg by mouth daily.    Marland Kitchen warfarin (COUMADIN) 1 MG tablet Take as directed by coumadin clinic 90 tablet 0   No current facility-administered medications on file prior to visit.    Allergies  Allergen Reactions  . Nitroglycerin Other (See Comments)    Became severely hypotensive  . Other     Intolerance to ACE inhibitors UNKNOWN REACTION    Past Medical History  Diagnosis Date  . Coronary artery disease   . Cardiomyopathy, ischemic     EF of 30-35%  . Status post CVA   . VT (ventricular tachycardia) (HCC) aug of 2010    removed old ICD and replaced with biventricular ICD  . CKD (chronic kidney disease) stage 3, GFR 30-59 ml/min   . Atrial fibrillation (HCC)     chronic  . Hypertension   . Dyslipidemia   . Peripheral vascular disease (HCC)   . Thyroid disease     hypo,due to amiodarone therapy,resolved  . Myocardial infarction [facility]) January 2001     History  of anteroseptal myocardial infarction presenting with left bundle-branch block  . Anxiety   . Tobacco abuse     History of 2 pack per day tobacco habituation  . Pacemaker   . CHF (congestive heart failure) [facility])     Past Surgical History  Procedure Laterality Date  . Cholecystectomy    . Femoral bypass      fem-fem bpg, right fem pop bpg  . Icd  07/15/2009    implant Dr. Ladona Ridgel  . Coronary artery bypass graft  2001    x 4  . Cardiac catheterization  2001    PTCA  . Cardiac catheterization  2005    patent grafts  . Cataracts  2006    right eye  . Implantable cardioverter defibrillator (icd) generator change N/A 11/28/2012     Procedure: ICD GENERATOR CHANGE;  Surgeon: Marinus Maw, MD;  Location: [facility] CATH LAB;  Service: Cardiovascular;  Laterality: N/A;    History  Smoking status  . Former Smoker  . Quit date: 09/30/2000  Smokeless tobacco  . Not on file    History  Alcohol Use: Not on file    Family History  Problem Relation Age of Onset  . Heart disease Mother   . Aneurysm Brother   . Stroke Mother   . Stroke Father     Review of Systems: As noted in history of present illness. All other systems were reviewed and are negative.  Physical Exam: BP 86/50 mmHg  Pulse 64  Ht  (1.651 m)  Wt 62.324 kg (137 lb 6.4 oz)  BMI 22.86 kg/m2 He is a pleasant white male who appears chronically ill. HEENT exam is unremarkable. Neck is supple no JVD, adenopathy, thyromegaly, or bruits. Lungs are clear. His ICD site is stable. Cardiac exam reveals a regular rate and rhythm without gallop, murmur, or click. Abdomen is soft and nontender. There are no masses or bruits. Extremities reveal 1+ pedal  edema. Pedal pulses are good. Skin is warm and dry. He is alert and oriented x3. Cranial nerves II through XII are intact.  LABORATORY DATA: Lab Results  Component Value Date   WBC 9.4 01/09/2016   HGB 14.5 01/09/2016   HCT 44.7 01/09/2016   PLT 237 01/09/2016   GLUCOSE 123* 01/09/2016   CHOL 104* 07/11/2015   TRIG 173* 07/11/2015   HDL 32* 07/11/2015   LDLCALC 37 07/11/2015   ALT 54* 01/09/2016   AST 75* 01/09/2016   NA 135 01/09/2016   K 4.5 01/09/2016   CL 99 01/09/2016   CREATININE 1.94* 01/09/2016   BUN 36* 01/09/2016   CO2 25 01/09/2016   TSH 4.983* 01/09/2016   INR 2.9 04/05/2016       Assessment / Plan: 1. Chronic systolic congestive heart failure. EF 20-25%.  He has mild pedal edema but otherwise appears well compensated. Weight is stable. Last [facility]. Lungs are clear. We will continue with his current medication including carvedilol, digoxin, and Lasix. Avoid ACE inhibitor/ARB due  to to renal insufficiency.   He is BiV paced. Stress sodium restriction. He is to call if edema gets any worse.   2. History of ventricular tachycardia. Status post biventricular ICD. ICD shock in July 2015. Now on amiodarone. No recurrent shocks on amiodarone.  3. Chronic atrial fibrillation. He is on anticoagulation with coumadin.  Rate is very well controlled. Adjust coumadin dose per pharmacy. INR 2.7 today.  4. Remote CVA.  5. Chronic kidney disease stage III. Stable.  6. Coronary disease status post CABG. No recurrent angina.  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 Now on synthroid. TSH is normal.

## 2016-04-24 ENCOUNTER — Ambulatory Visit (INDEPENDENT_AMBULATORY_CARE_PROVIDER_SITE_OTHER): Payer: Medicare Other | Admitting: *Deleted

## 2016-04-24 DIAGNOSIS — I6350 Cerebral infarction due to unspecified occlusion or stenosis of unspecified cerebral artery: Secondary | ICD-10-CM

## 2016-04-24 DIAGNOSIS — I482 Chronic atrial fibrillation, unspecified: Secondary | ICD-10-CM

## 2016-04-24 DIAGNOSIS — I4891 Unspecified atrial fibrillation: Secondary | ICD-10-CM

## 2016-04-24 DIAGNOSIS — Z5181 Encounter for therapeutic drug level monitoring: Secondary | ICD-10-CM | POA: Diagnosis not present

## 2016-04-24 DIAGNOSIS — Z7901 Long term (current) use of anticoagulants: Secondary | ICD-10-CM

## 2016-04-24 LAB — POCT INR: INR: 2.3

## 2016-04-26 ENCOUNTER — Telehealth: Payer: Self-pay | Admitting: Cardiology

## 2016-04-26 NOTE — Telephone Encounter (Signed)
Spoke to nurse Informed last echo 05/2014 --20-25% Last b/p and pulse given from last office visit 04/05/16  verbalized understanding.

## 2016-05-05 ENCOUNTER — Emergency Department (HOSPITAL_COMMUNITY): Payer: Medicare Other

## 2016-05-05 ENCOUNTER — Inpatient Hospital Stay (HOSPITAL_COMMUNITY)
Admission: EM | Admit: 2016-05-05 | Discharge: 2016-05-12 | DRG: 871 | Disposition: A | Discharge status: Home Health | Payer: Medicare Other | Attending: Internal Medicine | Admitting: Internal Medicine

## 2016-05-05 ENCOUNTER — Encounter (HOSPITAL_COMMUNITY): Payer: Self-pay | Admitting: Emergency Medicine

## 2016-05-05 DIAGNOSIS — I5023 Acute on chronic systolic (congestive) heart failure: Secondary | ICD-10-CM | POA: Diagnosis present

## 2016-05-05 DIAGNOSIS — I10 Essential (primary) hypertension: Secondary | ICD-10-CM | POA: Diagnosis present

## 2016-05-05 DIAGNOSIS — I255 Ischemic cardiomyopathy: Secondary | ICD-10-CM | POA: Diagnosis present

## 2016-05-05 DIAGNOSIS — I2510 Atherosclerotic heart disease of native coronary artery without angina pectoris: Secondary | ICD-10-CM | POA: Diagnosis present

## 2016-05-05 DIAGNOSIS — Z09 Encounter for follow-up examination after completed treatment for conditions other than malignant neoplasm: Secondary | ICD-10-CM

## 2016-05-05 DIAGNOSIS — J189 Pneumonia, unspecified organism: Secondary | ICD-10-CM | POA: Diagnosis not present

## 2016-05-05 DIAGNOSIS — I5021 Acute systolic (congestive) heart failure: Secondary | ICD-10-CM

## 2016-05-05 DIAGNOSIS — D638 Anemia in other chronic diseases classified elsewhere: Secondary | ICD-10-CM | POA: Diagnosis present

## 2016-05-05 DIAGNOSIS — I252 Old myocardial infarction: Secondary | ICD-10-CM

## 2016-05-05 DIAGNOSIS — J440 Chronic obstructive pulmonary disease with acute lower respiratory infection: Secondary | ICD-10-CM | POA: Diagnosis present

## 2016-05-05 DIAGNOSIS — Z9981 Dependence on supplemental oxygen: Secondary | ICD-10-CM

## 2016-05-05 DIAGNOSIS — Z8673 Personal history of transient ischemic attack (TIA), and cerebral infarction without residual deficits: Secondary | ICD-10-CM

## 2016-05-05 DIAGNOSIS — F419 Anxiety disorder, unspecified: Secondary | ICD-10-CM | POA: Diagnosis present

## 2016-05-05 DIAGNOSIS — A419 Sepsis, unspecified organism: Principal | ICD-10-CM | POA: Diagnosis present

## 2016-05-05 DIAGNOSIS — Z8249 Family history of ischemic heart disease and other diseases of the circulatory system: Secondary | ICD-10-CM

## 2016-05-05 DIAGNOSIS — I272 Other secondary pulmonary hypertension: Secondary | ICD-10-CM | POA: Diagnosis present

## 2016-05-05 DIAGNOSIS — J9621 Acute and chronic respiratory failure with hypoxia: Secondary | ICD-10-CM | POA: Diagnosis present

## 2016-05-05 DIAGNOSIS — R0902 Hypoxemia: Secondary | ICD-10-CM

## 2016-05-05 DIAGNOSIS — J154 Pneumonia due to other streptococci: Secondary | ICD-10-CM | POA: Diagnosis present

## 2016-05-05 DIAGNOSIS — Z9049 Acquired absence of other specified parts of digestive tract: Secondary | ICD-10-CM

## 2016-05-05 DIAGNOSIS — Z7901 Long term (current) use of anticoagulants: Secondary | ICD-10-CM

## 2016-05-05 DIAGNOSIS — Z79899 Other long term (current) drug therapy: Secondary | ICD-10-CM

## 2016-05-05 DIAGNOSIS — I130 Hypertensive heart and chronic kidney disease with heart failure and stage 1 through stage 4 chronic kidney disease, or unspecified chronic kidney disease: Secondary | ICD-10-CM | POA: Diagnosis present

## 2016-05-05 DIAGNOSIS — R17 Unspecified jaundice: Secondary | ICD-10-CM | POA: Diagnosis present

## 2016-05-05 DIAGNOSIS — Z888 Allergy status to other drugs, medicaments and biological substances status: Secondary | ICD-10-CM

## 2016-05-05 DIAGNOSIS — Z95810 Presence of automatic (implantable) cardiac defibrillator: Secondary | ICD-10-CM

## 2016-05-05 DIAGNOSIS — I4891 Unspecified atrial fibrillation: Secondary | ICD-10-CM | POA: Diagnosis not present

## 2016-05-05 DIAGNOSIS — E538 Deficiency of other specified B group vitamins: Secondary | ICD-10-CM | POA: Diagnosis present

## 2016-05-05 DIAGNOSIS — Z955 Presence of coronary angioplasty implant and graft: Secondary | ICD-10-CM

## 2016-05-05 DIAGNOSIS — Z951 Presence of aortocoronary bypass graft: Secondary | ICD-10-CM

## 2016-05-05 DIAGNOSIS — E785 Hyperlipidemia, unspecified: Secondary | ICD-10-CM | POA: Diagnosis present

## 2016-05-05 DIAGNOSIS — I739 Peripheral vascular disease, unspecified: Secondary | ICD-10-CM | POA: Diagnosis present

## 2016-05-05 DIAGNOSIS — E039 Hypothyroidism, unspecified: Secondary | ICD-10-CM | POA: Diagnosis present

## 2016-05-05 DIAGNOSIS — I482 Chronic atrial fibrillation: Secondary | ICD-10-CM | POA: Diagnosis present

## 2016-05-05 DIAGNOSIS — N183 Chronic kidney disease, stage 3 (moderate): Secondary | ICD-10-CM | POA: Diagnosis present

## 2016-05-05 LAB — I-STAT CG4 LACTIC ACID, ED: LACTIC ACID, VENOUS: 1.9 mmol/L (ref 0.5–2.0)

## 2016-05-05 LAB — CBC
HEMATOCRIT: 35.4 % — AB (ref 39.0–52.0)
HEMOGLOBIN: 10.9 g/dL — AB (ref 13.0–17.0)
MCH: 28.2 pg (ref 26.0–34.0)
MCHC: 30.8 g/dL (ref 30.0–36.0)
MCV: 91.5 fL (ref 78.0–100.0)
Platelets: 158 10*3/uL (ref 150–400)
RBC: 3.87 MIL/uL — ABNORMAL LOW (ref 4.22–5.81)
RDW: 16.2 % — ABNORMAL HIGH (ref 11.5–15.5)
WBC: 13.7 10*3/uL — ABNORMAL HIGH (ref 4.0–10.5)

## 2016-05-05 LAB — BRAIN NATRIURETIC PEPTIDE: B NATRIURETIC PEPTIDE 5: 806.5 pg/mL — AB (ref 0.0–100.0)

## 2016-05-05 LAB — PROTIME-INR
INR: 2.28 — AB (ref 0.00–1.49)
Prothrombin Time: 24.9 seconds — ABNORMAL HIGH (ref 11.6–15.2)

## 2016-05-05 LAB — BASIC METABOLIC PANEL
ANION GAP: 8 (ref 5–15)
BUN: 44 mg/dL — ABNORMAL HIGH (ref 6–20)
CHLORIDE: 105 mmol/L (ref 101–111)
CO2: 19 mmol/L — ABNORMAL LOW (ref 22–32)
Calcium: 8.4 mg/dL — ABNORMAL LOW (ref 8.9–10.3)
Creatinine, Ser: 2.61 mg/dL — ABNORMAL HIGH (ref 0.61–1.24)
GFR calc Af Amer: 26 mL/min — ABNORMAL LOW (ref >60)
GFR, EST NON AFRICAN AMERICAN: 22 mL/min — AB (ref >60)
GLUCOSE: 112 mg/dL — AB (ref 65–99)
POTASSIUM: 4.4 mmol/L (ref 3.5–5.1)
Sodium: 132 mmol/L — ABNORMAL LOW (ref 135–145)

## 2016-05-05 LAB — DIGOXIN LEVEL: Digoxin Level: 1.5 ng/mL (ref 0.8–2.0)

## 2016-05-05 LAB — I-STAT TROPONIN, ED: Troponin i, poc: 0.01 ng/mL (ref 0.00–0.08)

## 2016-05-05 MED ORDER — DEXTROSE 5 % IV SOLN
1.0000 g | Freq: Once | INTRAVENOUS | Status: AC
  Administered 2016-05-05: 1 g via INTRAVENOUS
  Filled 2016-05-05: qty 10

## 2016-05-05 MED ORDER — DEXTROSE 5 % IV SOLN
500.0000 mg | Freq: Once | INTRAVENOUS | Status: AC
  Administered 2016-05-05: 500 mg via INTRAVENOUS
  Filled 2016-05-05: qty 500

## 2016-05-05 MED ORDER — FUROSEMIDE 10 MG/ML IJ SOLN
40.0000 mg | Freq: Once | INTRAMUSCULAR | Status: AC
  Administered 2016-05-05: 40 mg via INTRAVENOUS
  Filled 2016-05-05: qty 4

## 2016-05-05 NOTE — ED Notes (Signed)
Patient with increased shortness of breath for the last couple of weeks.  Patient does have a cough with the shortness of breath.  Patient has history of CHF.  Patient initial oxygen sats in the 70s.  On 4L oxygen increased to 91-92.  Patient does have a demand pacer.

## 2016-05-06 ENCOUNTER — Encounter (HOSPITAL_COMMUNITY): Payer: Self-pay

## 2016-05-06 DIAGNOSIS — E538 Deficiency of other specified B group vitamins: Secondary | ICD-10-CM | POA: Diagnosis present

## 2016-05-06 DIAGNOSIS — I482 Chronic atrial fibrillation: Secondary | ICD-10-CM | POA: Diagnosis not present

## 2016-05-06 DIAGNOSIS — I272 Other secondary pulmonary hypertension: Secondary | ICD-10-CM | POA: Diagnosis present

## 2016-05-06 DIAGNOSIS — Z9049 Acquired absence of other specified parts of digestive tract: Secondary | ICD-10-CM | POA: Diagnosis not present

## 2016-05-06 DIAGNOSIS — Z951 Presence of aortocoronary bypass graft: Secondary | ICD-10-CM | POA: Diagnosis not present

## 2016-05-06 DIAGNOSIS — J9621 Acute and chronic respiratory failure with hypoxia: Secondary | ICD-10-CM | POA: Diagnosis present

## 2016-05-06 DIAGNOSIS — I739 Peripheral vascular disease, unspecified: Secondary | ICD-10-CM | POA: Diagnosis present

## 2016-05-06 DIAGNOSIS — I4891 Unspecified atrial fibrillation: Secondary | ICD-10-CM

## 2016-05-06 DIAGNOSIS — I2510 Atherosclerotic heart disease of native coronary artery without angina pectoris: Secondary | ICD-10-CM | POA: Diagnosis present

## 2016-05-06 DIAGNOSIS — Z95810 Presence of automatic (implantable) cardiac defibrillator: Secondary | ICD-10-CM | POA: Diagnosis not present

## 2016-05-06 DIAGNOSIS — I5021 Acute systolic (congestive) heart failure: Secondary | ICD-10-CM

## 2016-05-06 DIAGNOSIS — I509 Heart failure, unspecified: Secondary | ICD-10-CM | POA: Diagnosis not present

## 2016-05-06 DIAGNOSIS — J189 Pneumonia, unspecified organism: Secondary | ICD-10-CM | POA: Diagnosis present

## 2016-05-06 DIAGNOSIS — Z950 Presence of cardiac pacemaker: Secondary | ICD-10-CM

## 2016-05-06 DIAGNOSIS — Z888 Allergy status to other drugs, medicaments and biological substances status: Secondary | ICD-10-CM | POA: Diagnosis not present

## 2016-05-06 DIAGNOSIS — Z9981 Dependence on supplemental oxygen: Secondary | ICD-10-CM | POA: Diagnosis not present

## 2016-05-06 DIAGNOSIS — Z8249 Family history of ischemic heart disease and other diseases of the circulatory system: Secondary | ICD-10-CM | POA: Diagnosis not present

## 2016-05-06 DIAGNOSIS — I481 Persistent atrial fibrillation: Secondary | ICD-10-CM | POA: Diagnosis not present

## 2016-05-06 DIAGNOSIS — I255 Ischemic cardiomyopathy: Secondary | ICD-10-CM | POA: Diagnosis present

## 2016-05-06 DIAGNOSIS — I480 Paroxysmal atrial fibrillation: Secondary | ICD-10-CM | POA: Diagnosis not present

## 2016-05-06 DIAGNOSIS — E785 Hyperlipidemia, unspecified: Secondary | ICD-10-CM | POA: Diagnosis present

## 2016-05-06 DIAGNOSIS — A419 Sepsis, unspecified organism: Secondary | ICD-10-CM | POA: Diagnosis present

## 2016-05-06 DIAGNOSIS — Z79899 Other long term (current) drug therapy: Secondary | ICD-10-CM | POA: Diagnosis not present

## 2016-05-06 DIAGNOSIS — I10 Essential (primary) hypertension: Secondary | ICD-10-CM | POA: Diagnosis not present

## 2016-05-06 DIAGNOSIS — E039 Hypothyroidism, unspecified: Secondary | ICD-10-CM

## 2016-05-06 DIAGNOSIS — N183 Chronic kidney disease, stage 3 (moderate): Secondary | ICD-10-CM | POA: Diagnosis present

## 2016-05-06 DIAGNOSIS — J440 Chronic obstructive pulmonary disease with acute lower respiratory infection: Secondary | ICD-10-CM | POA: Diagnosis present

## 2016-05-06 DIAGNOSIS — I5023 Acute on chronic systolic (congestive) heart failure: Secondary | ICD-10-CM | POA: Diagnosis present

## 2016-05-06 DIAGNOSIS — I252 Old myocardial infarction: Secondary | ICD-10-CM | POA: Diagnosis not present

## 2016-05-06 DIAGNOSIS — D638 Anemia in other chronic diseases classified elsewhere: Secondary | ICD-10-CM | POA: Diagnosis present

## 2016-05-06 DIAGNOSIS — I130 Hypertensive heart and chronic kidney disease with heart failure and stage 1 through stage 4 chronic kidney disease, or unspecified chronic kidney disease: Secondary | ICD-10-CM | POA: Diagnosis present

## 2016-05-06 DIAGNOSIS — F419 Anxiety disorder, unspecified: Secondary | ICD-10-CM | POA: Diagnosis present

## 2016-05-06 DIAGNOSIS — R17 Unspecified jaundice: Secondary | ICD-10-CM | POA: Diagnosis present

## 2016-05-06 DIAGNOSIS — I5022 Chronic systolic (congestive) heart failure: Secondary | ICD-10-CM | POA: Diagnosis not present

## 2016-05-06 DIAGNOSIS — Z8673 Personal history of transient ischemic attack (TIA), and cerebral infarction without residual deficits: Secondary | ICD-10-CM | POA: Diagnosis not present

## 2016-05-06 DIAGNOSIS — Z7901 Long term (current) use of anticoagulants: Secondary | ICD-10-CM | POA: Diagnosis not present

## 2016-05-06 DIAGNOSIS — Z955 Presence of coronary angioplasty implant and graft: Secondary | ICD-10-CM | POA: Diagnosis not present

## 2016-05-06 DIAGNOSIS — J154 Pneumonia due to other streptococci: Secondary | ICD-10-CM | POA: Diagnosis present

## 2016-05-06 LAB — CBC WITH DIFFERENTIAL/PLATELET
BASOS ABS: 0 10*3/uL (ref 0.0–0.1)
Basophils Relative: 0 %
Eosinophils Absolute: 0.1 10*3/uL (ref 0.0–0.7)
Eosinophils Relative: 1 %
HEMATOCRIT: 34.3 % — AB (ref 39.0–52.0)
Hemoglobin: 10.8 g/dL — ABNORMAL LOW (ref 13.0–17.0)
LYMPHS ABS: 1.4 10*3/uL (ref 0.7–4.0)
LYMPHS PCT: 10 %
MCH: 28.9 pg (ref 26.0–34.0)
MCHC: 31.5 g/dL (ref 30.0–36.0)
MCV: 91.7 fL (ref 78.0–100.0)
MONO ABS: 1.5 10*3/uL — AB (ref 0.1–1.0)
Monocytes Relative: 11 %
NEUTROS ABS: 10.8 10*3/uL — AB (ref 1.7–7.7)
Neutrophils Relative %: 78 %
Platelets: 163 10*3/uL (ref 150–400)
RBC: 3.74 MIL/uL — ABNORMAL LOW (ref 4.22–5.81)
RDW: 16.2 % — AB (ref 11.5–15.5)
WBC: 13.8 10*3/uL — ABNORMAL HIGH (ref 4.0–10.5)

## 2016-05-06 LAB — HEPATIC FUNCTION PANEL
ALK PHOS: 129 U/L — AB (ref 38–126)
ALT: 14 U/L — ABNORMAL LOW (ref 17–63)
AST: 29 U/L (ref 15–41)
Albumin: 2.2 g/dL — ABNORMAL LOW (ref 3.5–5.0)
BILIRUBIN DIRECT: 0.5 mg/dL (ref 0.1–0.5)
BILIRUBIN INDIRECT: 0.5 mg/dL (ref 0.3–0.9)
BILIRUBIN TOTAL: 1 mg/dL (ref 0.3–1.2)
Total Protein: 6.2 g/dL — ABNORMAL LOW (ref 6.5–8.1)

## 2016-05-06 LAB — MRSA PCR SCREENING: MRSA by PCR: NEGATIVE

## 2016-05-06 LAB — LACTIC ACID, PLASMA
LACTIC ACID, VENOUS: 2.7 mmol/L — AB (ref 0.5–2.0)
Lactic Acid, Venous: 2 mmol/L (ref 0.5–2.0)

## 2016-05-06 LAB — PROTIME-INR
INR: 2.34 — ABNORMAL HIGH (ref 0.00–1.49)
PROTHROMBIN TIME: 25.4 s — AB (ref 11.6–15.2)

## 2016-05-06 LAB — STREP PNEUMONIAE URINARY ANTIGEN: STREP PNEUMO URINARY ANTIGEN: POSITIVE — AB

## 2016-05-06 LAB — TSH: TSH: 11.646 u[IU]/mL — ABNORMAL HIGH (ref 0.350–4.500)

## 2016-05-06 LAB — I-STAT CG4 LACTIC ACID, ED: Lactic Acid, Venous: 2.57 mmol/L (ref 0.5–2.0)

## 2016-05-06 LAB — HIV ANTIBODY (ROUTINE TESTING W REFLEX): HIV Screen 4th Generation wRfx: NONREACTIVE

## 2016-05-06 MED ORDER — WARFARIN SODIUM 1 MG PO TABS
0.5000 mg | ORAL_TABLET | Freq: Every day | ORAL | Status: DC
  Administered 2016-05-06 – 2016-05-11 (×6): 0.5 mg via ORAL
  Filled 2016-05-06 (×6): qty 1

## 2016-05-06 MED ORDER — IPRATROPIUM-ALBUTEROL 0.5-2.5 (3) MG/3ML IN SOLN: 3.0000 mL | Freq: Once | RESPIRATORY_TRACT | Status: DC

## 2016-05-06 MED ORDER — ACETAMINOPHEN 500 MG PO TABS: 1000.0000 mg | ORAL_TABLET | Freq: Four times a day (QID) | ORAL | Status: DC | PRN

## 2016-05-06 MED ORDER — CEFTRIAXONE SODIUM 1 G IJ SOLR
1.0000 g | INTRAMUSCULAR | Status: DC
  Administered 2016-05-06 – 2016-05-08 (×3): 1 g via INTRAVENOUS
  Filled 2016-05-06 (×4): qty 10

## 2016-05-06 MED ORDER — ALPRAZOLAM 0.5 MG PO TABS
0.5000 mg | ORAL_TABLET | Freq: Every day | ORAL | Status: DC | PRN
  Administered 2016-05-08 – 2016-05-10 (×3): 0.5 mg via ORAL
  Filled 2016-05-06 (×3): qty 1

## 2016-05-06 MED ORDER — LEVOTHYROXINE SODIUM 50 MCG PO TABS
50.0000 ug | ORAL_TABLET | Freq: Every day | ORAL | Status: DC
  Administered 2016-05-06 – 2016-05-12 (×7): 50 ug via ORAL
  Filled 2016-05-06 (×7): qty 1

## 2016-05-06 MED ORDER — ALPRAZOLAM 0.25 MG PO TABS: 0.5000 mg | ORAL_TABLET | ORAL | Status: DC

## 2016-05-06 MED ORDER — FUROSEMIDE 10 MG/ML IJ SOLN
40.0000 mg | Freq: Once | INTRAMUSCULAR | Status: AC
  Administered 2016-05-06: 40 mg via INTRAVENOUS
  Filled 2016-05-06: qty 4

## 2016-05-06 MED ORDER — WARFARIN - PHARMACIST DOSING INPATIENT: Freq: Every day | Status: DC

## 2016-05-06 MED ORDER — CARVEDILOL 12.5 MG PO TABS
12.5000 mg | ORAL_TABLET | Freq: Two times a day (BID) | ORAL | Status: DC
  Administered 2016-05-06 – 2016-05-09 (×7): 12.5 mg via ORAL
  Filled 2016-05-06 (×7): qty 1

## 2016-05-06 MED ORDER — SODIUM CHLORIDE 0.9% FLUSH
3.0000 mL | INTRAVENOUS | Status: DC | PRN
  Administered 2016-05-09: 10 mL via INTRAVENOUS
  Filled 2016-05-06: qty 3

## 2016-05-06 MED ORDER — PROSIGHT PO TABS
1.0000 | ORAL_TABLET | Freq: Every day | ORAL | Status: DC
  Administered 2016-05-06 – 2016-05-12 (×7): 1 via ORAL
  Filled 2016-05-06 (×7): qty 1

## 2016-05-06 MED ORDER — SODIUM CHLORIDE 0.9 % IV BOLUS (SEPSIS)
250.0000 mL | Freq: Once | INTRAVENOUS | Status: AC
  Administered 2016-05-06: 250 mL via INTRAVENOUS

## 2016-05-06 MED ORDER — ONDANSETRON HCL 4 MG/2ML IJ SOLN: 4.0000 mg | Freq: Four times a day (QID) | INTRAMUSCULAR | Status: DC | PRN

## 2016-05-06 MED ORDER — AMIODARONE HCL 200 MG PO TABS
200.0000 mg | ORAL_TABLET | Freq: Every day | ORAL | Status: DC
  Administered 2016-05-06 – 2016-05-12 (×7): 200 mg via ORAL
  Filled 2016-05-06 (×7): qty 1

## 2016-05-06 MED ORDER — ACETAMINOPHEN 325 MG PO TABS: 650.0000 mg | ORAL_TABLET | ORAL | Status: DC | PRN

## 2016-05-06 MED ORDER — AZITHROMYCIN 500 MG IV SOLR: 500.0000 mg | INTRAVENOUS | Status: DC

## 2016-05-06 MED ORDER — DIGOXIN 125 MCG PO TABS
0.0625 mg | ORAL_TABLET | ORAL | Status: DC
  Administered 2016-05-06 – 2016-05-12 (×4): 0.0625 mg via ORAL
  Filled 2016-05-06 (×6): qty 1

## 2016-05-06 MED ORDER — SODIUM CHLORIDE 0.9 % IV SOLN: 250.0000 mL | INTRAVENOUS | Status: DC | PRN

## 2016-05-06 MED ORDER — CALCITRIOL 0.25 MCG PO CAPS
0.2500 ug | ORAL_CAPSULE | Freq: Every day | ORAL | Status: DC
  Administered 2016-05-06 – 2016-05-12 (×7): 0.25 ug via ORAL
  Filled 2016-05-06 (×7): qty 1

## 2016-05-06 MED ORDER — PRAVASTATIN SODIUM 40 MG PO TABS
40.0000 mg | ORAL_TABLET | Freq: Every day | ORAL | Status: DC
  Administered 2016-05-06 – 2016-05-11 (×6): 40 mg via ORAL
  Filled 2016-05-06 (×6): qty 1

## 2016-05-06 MED ORDER — CETYLPYRIDINIUM CHLORIDE 0.05 % MT LIQD
7.0000 mL | Freq: Two times a day (BID) | OROMUCOSAL | Status: DC
  Administered 2016-05-06 – 2016-05-12 (×13): 7 mL via OROMUCOSAL

## 2016-05-06 MED ORDER — ALPRAZOLAM 0.5 MG PO TABS
0.5000 mg | ORAL_TABLET | Freq: Two times a day (BID) | ORAL | Status: DC
  Administered 2016-05-06 – 2016-05-12 (×14): 0.5 mg via ORAL
  Filled 2016-05-06 (×14): qty 1

## 2016-05-06 MED ORDER — GUAIFENESIN ER 600 MG PO TB12
600.0000 mg | ORAL_TABLET | Freq: Two times a day (BID) | ORAL | Status: DC
  Administered 2016-05-06 – 2016-05-09 (×9): 600 mg via ORAL
  Filled 2016-05-06 (×9): qty 1

## 2016-05-06 MED ORDER — LOPERAMIDE HCL 2 MG PO CAPS
2.0000 mg | ORAL_CAPSULE | Freq: Four times a day (QID) | ORAL | Status: DC | PRN
Start: 2016-05-06 — End: 2016-05-12

## 2016-05-06 MED ORDER — SODIUM CHLORIDE 0.9% FLUSH
3.0000 mL | Freq: Two times a day (BID) | INTRAVENOUS | Status: DC
  Administered 2016-05-06 – 2016-05-12 (×11): 3 mL via INTRAVENOUS

## 2016-05-06 MED ORDER — IPRATROPIUM-ALBUTEROL 0.5-2.5 (3) MG/3ML IN SOLN
3.0000 mL | RESPIRATORY_TRACT | Status: DC | PRN
  Administered 2016-05-06 – 2016-05-09 (×3): 3 mL via RESPIRATORY_TRACT
  Filled 2016-05-06 (×3): qty 3

## 2016-05-06 MED ORDER — POTASSIUM CHLORIDE CRYS ER 10 MEQ PO TBCR
10.0000 meq | EXTENDED_RELEASE_TABLET | Freq: Every day | ORAL | Status: DC
  Administered 2016-05-06 – 2016-05-12 (×7): 10 meq via ORAL
  Filled 2016-05-06 (×7): qty 1

## 2016-05-06 MED ORDER — FUROSEMIDE 10 MG/ML IJ SOLN
40.0000 mg | Freq: Two times a day (BID) | INTRAMUSCULAR | Status: DC
  Administered 2016-05-06 – 2016-05-08 (×5): 40 mg via INTRAVENOUS
  Filled 2016-05-06 (×6): qty 4

## 2016-05-06 NOTE — Progress Notes (Signed)
Patient's stats were 86-89% on 2L. When RN adjusted oxygen to 3L, patient showed little improvement. Respiratory was called to assess patient. Respiratory applied a high flow cannula on 12L to the patient. Patient's stats were then around 92-94%. 40mg  of IV Lasix was given per MD.

## 2016-05-06 NOTE — Progress Notes (Signed)
RT Note: RT called to assess pt due to sats in 80s & increased WOB. Pt given PRN HHN treatment, attempted to increase O2 to 6L with no change. Pt put on 15L HFNC, sats 92-94%, tolerating well. RT will continue to monitor.

## 2016-05-06 NOTE — Progress Notes (Signed)
PROGRESS NOTE    [name] [name]  ZOX:[id number] DOB: Mar 22, 1941 DOA: 05/05/2016 PCP: Pamelia Hoit, MD    Brief Narrative: [name] [name] is a 75 y.o. male with medical history significant of systolic CHF last EF 20-25% in 05/2014, ischemic cardiomyopathy, s/p PMCAD, CKD stage III; who presents with worsening shortness of breath and cough over the last 2-3 days.   Assessment & Plan:   Principal Problem:   Acute congestive heart failure (HCC) Active Problems:   Essential hypertension   Atrial fibrillation (HCC)   Hypothyroidism (acquired)   CAP (community acquired pneumonia)   Acute respiratory failure with hypoxia; secondary to streptococcal pneumonia, and acute on chronic systolic heart failure.  On high flow oxygen , keep oxygen sats greater than 90%.  Resume iV rocephin. Urine for strept antigen positive.  Echocardiogram ordered.  Started on IV lasix, strict intake and output.  Daily weights.    Atrial fibrillation: Rate controlled on coumadin.  INR therapeutic.  Amiodarone    Hypothyroidsim: Resume synthroid.  TSH elevated, get free t4 and free t3.    Anemia: Normocytic.  Get anemia panel.    Hypertension: Pt baseline bp runs around 80/50's.     Sepsis from community acquired pneumonia; Elevated lactic acid, tachypnea, hypotension, strept pneumonia. On admission.  Repeat lactic acid .      DVT prophylaxis: (Lovenox) Code Status: (Full Family Communication: family at bedside.  Disposition Plan: pending further management.    Consultants:   Cardiology.   Procedures: repeat ECHO  Antimicrobials: rocephin  Zithromax.   Subjective: Reports sob improved.  Objective: Filed Vitals:   05/06/16 1114 05/06/16 1156 05/06/16 1213 05/06/16 1230  BP: 91/55   95/57  Pulse: 60  60 57  Temp: 97.8 F (36.6 C)     TempSrc: Oral     Resp: 24   25  Height:      Weight:      SpO2: 87% 91% 93% 93%    Intake/Output Summary (Last 24 hours) at  05/06/16 1257 Last data filed at 05/06/16 0949  Gross per 24 hour  Intake    120 ml  Output    150 ml  Net    -30 ml   Filed Weights   05/06/16 0115  Weight: 61.5 kg (135 lb 9.3 oz)    Examination:  General exam: Anxious.  Respiratory system: basilar crackles, increased work of breathing, scattered wheezing.  Cardiovascular system: S1 & S2 heard, RRR. No JVD, murmurs, rubs, gallops or clicks. No pedal edema. Gastrointestinal system: Abdomen is nondistended, soft and nontender. No organomegaly or masses felt. Normal bowel sounds heard. Central nervous system: Alert and oriented. No focal neurological deficits. Extremities: Symmetric 5 x 5 power. Skin: No rashes, lesions or ulcers     Data Reviewed: I have personally reviewed following labs and imaging studies  CBC:  Recent Labs Lab 05/05/16 2040 05/06/16 0311  WBC 13.7* 13.8*  NEUTROABS  --  10.8*  HGB 10.9* 10.8*  HCT 35.4* 34.3*  MCV 91.5 91.7  PLT 158 163   Basic Metabolic Panel:  Recent Labs Lab 05/05/16 2040  NA 132*  K 4.4  CL 105  CO2 19*  GLUCOSE 112*  BUN 44*  CREATININE 2.61*  CALCIUM 8.4*   GFR: Estimated Creatinine Clearance: 20.6 mL/min (by C-G formula based on Cr of 2.61). Liver Function Tests: No results for input(s): AST, ALT, ALKPHOS, BILITOT, PROT, ALBUMIN in the last 168 hours. No results for input(s): LIPASE, AMYLASE in the  last 168 hours. No results for input(s): AMMONIA in the last 168 hours. Coagulation Profile:  Recent Labs Lab 05/05/16 2241 05/06/16 0311  INR 2.28* 2.34*   Cardiac Enzymes: No results for input(s): CKTOTAL, CKMB, CKMBINDEX, TROPONINI in the last 168 hours. BNP (last 3 results) No results for input(s): PROBNP in the last 8760 hours. HbA1C: No results for input(s): HGBA1C in the last 72 hours. CBG: No results for input(s): GLUCAP in the last 168 hours. Lipid Profile: No results for input(s): CHOL, HDL, LDLCALC, TRIG, CHOLHDL, LDLDIRECT in the last 72  hours. Thyroid Function Tests:  Recent Labs  05/06/16 0311  TSH 11.646*   Anemia Panel: No results for input(s): VITAMINB12, FOLATE, FERRITIN, TIBC, IRON, RETICCTPCT in the last 72 hours. Sepsis Labs:  Recent Labs Lab 05/05/16 2249 05/06/16 0119  LATICACIDVEN 1.90 2.57*    Recent Results (from the past 240 hour(s))  MRSA PCR Screening     Status: None   Collection Time: 05/06/16  1:49 AM  Result Value Ref Range Status   MRSA by PCR NEGATIVE NEGATIVE Final    Comment:        The GeneXpert MRSA Assay (FDA approved for NASAL specimens only), is one component of a comprehensive MRSA colonization surveillance program. It is not intended to diagnose MRSA infection nor to guide or monitor treatment for MRSA infections.          Radiology Studies: Dg Chest 2 View  05/05/2016  CLINICAL DATA:  Increasing shortness of Breath EXAM: CHEST  2 VIEW COMPARISON:  01/24/2013 FINDINGS: Cardiac shadow is stable. Defibrillator is again noted and stable. Lungs are well aerated bilaterally. Diffuse interstitial changes are seen. Additionally increased density is noted in the bases bilaterally consistent with some acute on chronic infiltrate. No bony abnormality is seen. Postsurgical changes are noted. IMPRESSION: Acute on chronic bibasilar infiltrate. Electronically Signed   By: Alcide Clever M.D.   On: 05/05/2016 21:03        Scheduled Meds: . ALPRAZolam  0.5 mg Oral BID  . amiodarone  200 mg Oral Daily  . antiseptic oral rinse  7 mL Mouth Rinse BID  . calcitRIOL  0.25 mcg Oral Daily  . carvedilol  12.5 mg Oral BID  . cefTRIAXone (ROCEPHIN)  IV  1 g Intravenous Q24H  . digoxin  0.0625 mg Oral QODAY  . furosemide  40 mg Intravenous Once  . furosemide  40 mg Intravenous Q12H  . guaiFENesin  600 mg Oral BID  . ipratropium-albuterol  3 mL Nebulization Once  . levothyroxine  50 mcg Oral QAC breakfast  . multivitamin  1 tablet Oral Daily  . potassium chloride  10 mEq Oral Daily  .  pravastatin  40 mg Oral q1800  . sodium chloride flush  3 mL Intravenous Q12H  . warfarin  0.5 mg Oral q1800  . Warfarin - Pharmacist Dosing Inpatient   Does not apply q1800   Continuous Infusions:    LOS: 0 days    Time spent: 25 minutes.     Kathlen Mody, MD [facility] Pager [phone] If 7PM-7AM, please contact night-coverage [email or web address] Password [facility] 05/06/2016, 12:57 PM

## 2016-05-06 NOTE — ED Provider Notes (Signed)
CSN: [id number]     Arrival date & time 05/05/16  2010 History   First MD Initiated Contact with Patient 05/05/16 2034     Chief Complaint  Patient presents with  . Shortness of Breath     (Consider location/radiation/quality/duration/timing/severity/associated sxs/prior Treatment) Patient is a 75 y.o. male presenting with shortness of breath.  Shortness of Breath Severity:  Severe Onset quality:  Gradual Duration:  1 month Timing:  Constant Progression:  Waxing and waning Context: not URI   Relieved by:  Rest Worsened by:  Exertion Ineffective treatments:  Lying down, sitting up, position changes and diuretics Associated symptoms: cough and sputum production (white-yellow)   Associated symptoms: no abdominal pain, no chest pain, no fever, no headaches, no rash, no sore throat and no vomiting   75 year old male with a history of chronic systolic congestive heart failure with an ejection fraction of 20-25% on 2L of O2, history of ventricular tachycardia with ICD in place, chronic atrial fibrillation on Coumadin, remote CVA, CK D stage III, coronary artery disease, hypothyroidism, presents with concern of one month of increasing dyspnea and cough productive of white-yellow sputum.  Past Medical History  Diagnosis Date  . Coronary artery disease   . Cardiomyopathy, ischemic     EF of 30-35%  . Status post CVA   . VT (ventricular tachycardia) (HCC) aug of 2010    removed old ICD and replaced with biventricular ICD  . CKD (chronic kidney disease) stage 3, GFR 30-59 ml/min   . Atrial fibrillation (HCC)     chronic  . Hypertension   . Dyslipidemia   . Peripheral vascular disease (HCC)   . Thyroid disease     hypo,due to amiodarone therapy,resolved  . Myocardial infarction [facility]) January 2001     History of anteroseptal myocardial infarction presenting with left bundle-branch block  . Anxiety   . Tobacco abuse     History of 2 pack per day tobacco habituation  . Pacemaker   .  CHF (congestive heart failure) [facility])    Past Surgical History  Procedure Laterality Date  . Cholecystectomy    . Femoral bypass      fem-fem bpg, right fem pop bpg  . Icd  07/15/2009    implant Dr. Ladona Ridgel  . Coronary artery bypass graft  2001    x 4  . Cardiac catheterization  2001    PTCA  . Cardiac catheterization  2005    patent grafts  . Cataracts  2006    right eye  . Implantable cardioverter defibrillator (icd) generator change N/A 11/28/2012    Procedure: ICD GENERATOR CHANGE;  Surgeon: Marinus Maw, MD;  Location: [facility] CATH LAB;  Service: Cardiovascular;  Laterality: N/A;   Family History  Problem Relation Age of Onset  . Heart disease Mother   . Aneurysm Brother   . Stroke Mother   . Stroke Father    Social History  Substance Use Topics  . Smoking status: Former Smoker    Quit date: 09/30/2000  . Smokeless tobacco: None  . Alcohol Use: None    Review of Systems  Constitutional: Positive for fatigue. Negative for fever.  HENT: Negative for sore throat.   Eyes: Negative for visual disturbance.  Respiratory: Positive for cough, sputum production (white-yellow) and shortness of breath.   Cardiovascular: Negative for chest pain and leg swelling (1 wk ago appeared to have sweling on right which resolved).  Gastrointestinal: Negative for nausea, vomiting, abdominal pain and diarrhea.  Genitourinary: Negative  for difficulty urinating.  Musculoskeletal: Negative for back pain and neck stiffness.  Skin: Negative for rash.  Neurological: Negative for syncope and headaches.      Allergies  Nitroglycerin and Other  Home Medications   Prior to Admission medications   Medication Sig Start Date End Date Taking? Authorizing Provider  acetaminophen (TYLENOL) 500 MG tablet Take 1,000 mg by mouth every 6 (six) hours as needed (pain).   Yes Historical Provider, MD  allopurinol (ZYLOPRIM) 100 MG tablet Take 1 tablet (100 mg total) by mouth daily. 03/13/16  Yes Peter M [name],  MD  ALPRAZolam Prudy Feeler) 0.5 MG tablet Take 1 tablet (0.5 mg total) by mouth 3 (three) times daily as needed for anxiety. Patient taking differently: Take 0.5 mg by mouth See admin instructions. Take 1 tablet (0.5 mg) by mouth twice daily, may also take 1 tablet in the afternoon as needed for anxiety 03/13/16  Yes Peter M [name], MD  amiodarone (PACERONE) 200 MG tablet Take 1 tablet (200 mg total) by mouth daily. 01/31/16  Yes Peter M [name], MD  calcitRIOL (ROCALTROL) 0.25 MCG capsule Take 1 capsule (0.25 mcg total) by mouth daily. 04/03/16  Yes Peter M [name], MD  carvedilol (COREG) 25 MG tablet Take 0.5 tablets (12.5 mg total) by mouth 2 (two) times daily. 04/05/16 04/05/17 Yes Peter M [name], MD  Cyanocobalamin (VITAMIN B-12 PO) Take 1 tablet by mouth daily.   Yes Historical Provider, MD  digoxin (LANOXIN) 0.125 MG tablet Take 1/2 tablet 0.0625 mg every other day Patient taking differently: Take 0.0625 mg by mouth every other day. Take 1/2 tablet  every other day 01/31/16  Yes Peter M [name], MD  furosemide (LASIX) 40 MG tablet Take 1 tablet (40 mg total) by mouth 2 (two) times daily. 01/31/16  Yes Peter M [name], MD  levothyroxine (SYNTHROID, LEVOTHROID) 50 MCG tablet Take 1 tablet (50 mcg total) by mouth daily before breakfast. 03/13/16  Yes Peter M [name], MD  loperamide (IMODIUM A-D) 2 MG tablet Take 2 mg by mouth 4 (four) times daily as needed for diarrhea or loose stools.   Yes Historical Provider, MD  lovastatin (MEVACOR) 40 MG tablet Take 1 tablet (40 mg total) by mouth at bedtime. 01/31/16  Yes Peter M [name], MD  Multiple Vitamins-Minerals (PRESERVISION AREDS 2) CAPS Take 1 capsule by mouth daily.    Yes Historical Provider, MD  OVER THE COUNTER MEDICATION Place 1 patch onto the skin daily as needed (pain). OTC pain patch   Yes Historical Provider, MD  OXYGEN Inhale 2 L into the lungs continuous.   Yes Historical Provider, MD  potassium chloride (K-DUR) 10 MEQ tablet Take 1 tablet (10 mEq total) by  mouth daily. 01/31/16  Yes Peter M [name], MD  warfarin (COUMADIN) 1 MG tablet Take as directed by coumadin clinic Patient taking differently: Take 0.5 mg by mouth daily. or as directed by coumadin clinic 01/31/16  Yes Peter M [name], MD   BP 97/60 mmHg  Pulse 58  Temp(Src) 97.6 F (36.4 C) (Oral)  Resp 28  Wt 135 lb 9.3 oz (61.5 kg)  SpO2 94% Physical Exam  Constitutional: He is oriented to person, place, and time. He appears well-developed and well-nourished. He appears ill. No distress.  HENT:  Head: Normocephalic and atraumatic.  Eyes: Conjunctivae and EOM are normal.  Neck: Normal range of motion. JVD present.  Cardiovascular: Normal rate, regular rhythm, normal heart sounds and intact distal pulses.  Exam reveals no gallop and no friction  rub.   No murmur heard. Pulmonary/Chest: Effort normal and breath sounds normal. No respiratory distress. He has no wheezes. He has no rales.  Diminished breath sounds bilaterally at bases  Abdominal: Soft. He exhibits no distension. There is no tenderness. There is no guarding.  Musculoskeletal: He exhibits no edema.  Neurological: He is alert and oriented to person, place, and time.  Skin: Skin is warm and dry. He is not diaphoretic.  Nursing note and vitals reviewed.   ED Course  Procedures (including critical care time) Labs Review Labs Reviewed  BASIC METABOLIC PANEL - Abnormal; Notable for the following:    Sodium 132 (*)    CO2 19 (*)    Glucose, Bld 112 (*)    BUN 44 (*)    Creatinine, Ser 2.61 (*)    Calcium 8.4 (*)    GFR calc non Af Amer 22 (*)    GFR calc Af Amer 26 (*)    All other components within normal limits  CBC - Abnormal; Notable for the following:    WBC 13.7 (*)    RBC 3.87 (*)    Hemoglobin 10.9 (*)    HCT 35.4 (*)    RDW 16.2 (*)    All other components within normal limits  BRAIN NATRIURETIC PEPTIDE - Abnormal; Notable for the following:    B Natriuretic Peptide 806.5 (*)    All other components within  normal limits  PROTIME-INR - Abnormal; Notable for the following:    Prothrombin Time 24.9 (*)    INR 2.28 (*)    All other components within normal limits  I-STAT CG4 LACTIC ACID, ED - Abnormal; Notable for the following:    Lactic Acid, Venous 2.57 (*)    All other components within normal limits  CULTURE, BLOOD (ROUTINE X 2)  CULTURE, BLOOD (ROUTINE X 2)  CULTURE, EXPECTORATED SPUTUM-ASSESSMENT  GRAM STAIN  MRSA PCR SCREENING  DIGOXIN LEVEL  CBC WITH DIFFERENTIAL/PLATELET  TSH  HIV ANTIBODY (ROUTINE TESTING)  STREP PNEUMONIAE URINARY ANTIGEN  PROTIME-INR  I-STAT TROPOININ, ED  I-STAT CG4 LACTIC ACID, ED    Imaging Review Dg Chest 2 View  05/05/2016  CLINICAL DATA:  Increasing shortness of Breath EXAM: CHEST  2 VIEW COMPARISON:  01/24/2013 FINDINGS: Cardiac shadow is stable. Defibrillator is again noted and stable. Lungs are well aerated bilaterally. Diffuse interstitial changes are seen. Additionally increased density is noted in the bases bilaterally consistent with some acute on chronic infiltrate. No bony abnormality is seen. Postsurgical changes are noted. IMPRESSION: Acute on chronic bibasilar infiltrate. Electronically Signed   By: Alcide Clever M.D.   On: 05/05/2016 21:03   I have personally reviewed and evaluated these images and lab results as part of my medical decision-making.   EKG Interpretation None      MDM   Final diagnoses:  Acute systolic congestive heart failure (HCC)  CAP (community acquired pneumonia)  Hypoxia   75 year old male with a history of chronic systolic congestive heart failure with an ejection fraction of 20-25% on 2L of O2, history of ventricular tachycardia with ICD in place, chronic atrial fibrillation on Coumadin, remote CVA, CK D stage III, coronary artery disease, hypothyroidism, presents with concern of one month of increasing dyspnea and cough productive of white-yellow sputum with increased O2 requirement to 4L.  Patient with mild  leukocytosis, concerning for acute and chronic infiltrate on exam, and given productive cough, we'll treat for community acquired pneumonia with Rocephin and azithromycin. Blood cultures were drawn prior  to antibiotics being initiated. Feel patient's dyspnea is likely multifactorial, however, with patient with JVD, increasing dyspnea on exertion, and BNP 800s have concern for patient with acute on chronic systolic congestive heart failure, and he was given 40 mg of IV Lasix. Patient to be admitted to the hospitalist service for continued care of acute on chronic hypoxia with concern for congestive heart failure and possible community-acquired pneumonia.  Alvira Monday, MD 05/06/16 [phone]

## 2016-05-06 NOTE — ED Notes (Signed)
Admitting at bedside 

## 2016-05-06 NOTE — Progress Notes (Signed)
ANTICOAGULATION CONSULT NOTE - Initial Consult  Pharmacy Consult for Coumadin Indication: atrial fibrillation  Allergies  Allergen Reactions  . Nitroglycerin Other (See Comments)    Became severely hypotensive  . Other     Intolerance to ACE inhibitors UNKNOWN REACTION    Patient Measurements:    Vital Signs: Temp: 97.6 F (36.4 C) (05/27 2021) Temp Source: Oral (05/27 2021) BP: 100/57 mmHg (05/27 2300) Pulse Rate: 59 (05/27 2300)  Labs:  Recent Labs  05/05/16 2040 05/05/16 2241  HGB 10.9*  --   HCT 35.4*  --   PLT 158  --   LABPROT  --  24.9*  INR  --  2.28*  CREATININE 2.61*  --     CrCl cannot be calculated (Unknown ideal weight.).   Medical History: Past Medical History  Diagnosis Date  . Coronary artery disease   . Cardiomyopathy, ischemic     EF of 30-35%  . Status post CVA   . VT (ventricular tachycardia) (HCC) aug of 2010    removed old ICD and replaced with biventricular ICD  . CKD (chronic kidney disease) stage 3, GFR 30-59 ml/min   . Atrial fibrillation (HCC)     chronic  . Hypertension   . Dyslipidemia   . Peripheral vascular disease (HCC)   . Thyroid disease     hypo,due to amiodarone therapy,resolved  . Myocardial infarction [facility]) January 2001     History of anteroseptal myocardial infarction presenting with left bundle-branch block  . Anxiety   . Tobacco abuse     History of 2 pack per day tobacco habituation  . Pacemaker   . CHF (congestive heart failure) (HCC)     Medications:  Allopurinol  Xanax  Amiodarone  Calcitriol  Coreg  Digoxin  Lasix  Synthroid  Mevacor  KCl  MVI   Coumadin 0.5 mg daily  Assessment: 76 y.o. male admitted with CHF exacerbation, h/o Afib, to continue Coumadin  Goal of Therapy:  INR 2-3 Monitor platelets by anticoagulation protocol: Yes   Plan:  Continue home regimen F/U daily INR  Nesa Distel, Gary Fleet 05/06/2016,12:29 AM

## 2016-05-06 NOTE — H&P (Signed)
History and Physical    Rizwan Kuyper Duris ZOX:[id number] DOB: 01-14-1941 DOA: 05/05/2016  Referring MD/NP/PA: Dr. Dalene Seltzer PCP: Pamelia Hoit, MD  Patient coming from: home  Chief Complaint: Shortness of breath  HPI: [name] [name] is a 75 y.o. male with medical history significant of systolic CHF last EF 20-25% in 05/2014, ischemic cardiomyopathy, s/p PMCAD, CKD stage III; who presents with worsening shortness of breath and cough over the last 2-3 days. Patient daughter is present at bedside and helps provide additional history. Patient had reportedly been doing well for the last 3 weeks up until 3 days ago. He was reported to have started coughing. Family initially thought symptoms were secondary to allergies and treated the patient with Alka-Seltzer allergy. Notes having a productive cough with thick brownish sputum production. Patient's wife noted him to be wheezing yesterday night. Symptoms progressively worsened today and that he was unable to catch his breath even with minimal exertion. O2 sats were noted to be in the 70s on EMS arrival patient wears 2 L nasal cannula oxygen normally and can intermittently go without. However, he had to be placed on 4 liters nasal cannula O2 sats to increased O2 sats to 92%. Patient states that he recently had an echocardiogram and ultrasound of his abdomen performed at [facility]. Patient's wife presently not available to verify this statement.  ED Course: Upon admission to the emergency room patient was evaluated pacemaker was noted to been interrogated and functioning properly. Initially blood pressure was as low as 90/44, O2 sats as low as 89% on 4 liters, heart rate 58, and he was afebrile. Initial workup revealed  WBC 13.7, hemoglobin 10.9, INR 2.28 BUN 44, creatinine 2.61, lactic acid 1.9, digoxin level I.5. Chest x-ray showed acute on chronic bibasilar opacities for which the patient was started on ceftriaxone and Rocephin for possible  community-acquired pneumonia.  Review of Systems: As per HPI otherwise 10 point review of systems negative.   Past Medical History  Diagnosis Date  . Coronary artery disease   . Cardiomyopathy, ischemic     EF of 30-35%  . Status post CVA   . VT (ventricular tachycardia) (HCC) aug of 2010    removed old ICD and replaced with biventricular ICD  . CKD (chronic kidney disease) stage 3, GFR 30-59 ml/min   . Atrial fibrillation (HCC)     chronic  . Hypertension   . Dyslipidemia   . Peripheral vascular disease (HCC)   . Thyroid disease     hypo,due to amiodarone therapy,resolved  . Myocardial infarction [facility]) January 2001     History of anteroseptal myocardial infarction presenting with left bundle-branch block  . Anxiety   . Tobacco abuse     History of 2 pack per day tobacco habituation  . Pacemaker   . CHF (congestive heart failure) [facility])     Past Surgical History  Procedure Laterality Date  . Cholecystectomy    . Femoral bypass      fem-fem bpg, right fem pop bpg  . Icd  07/15/2009    implant Dr. Ladona Ridgel  . Coronary artery bypass graft  2001    x 4  . Cardiac catheterization  2001    PTCA  . Cardiac catheterization  2005    patent grafts  . Cataracts  2006    right eye  . Implantable cardioverter defibrillator (icd) generator change N/A 11/28/2012    Procedure: ICD GENERATOR CHANGE;  Surgeon: Marinus Maw, MD;  Location: [facility] CATH LAB;  Service: Cardiovascular;  Laterality: N/A;     reports that he quit smoking about 15 years ago. He does not have any smokeless tobacco history on file. His alcohol and drug histories are not on file.  Allergies  Allergen Reactions  . Nitroglycerin Other (See Comments)    Became severely hypotensive  . Other     Intolerance to ACE inhibitors UNKNOWN REACTION    Family History  Problem Relation Age of Onset  . Heart disease Mother   . Aneurysm Brother   . Stroke Mother   . Stroke Father     Prior to Admission medications     Medication Sig Start Date End Date Taking? Authorizing Provider  acetaminophen (TYLENOL) 500 MG tablet Take 1,000 mg by mouth every 6 (six) hours as needed (pain).   Yes Historical Provider, MD  allopurinol (ZYLOPRIM) 100 MG tablet Take 1 tablet (100 mg total) by mouth daily. 03/13/16  Yes Peter M [name], MD  ALPRAZolam Prudy Feeler) 0.5 MG tablet Take 1 tablet (0.5 mg total) by mouth 3 (three) times daily as needed for anxiety. Patient taking differently: Take 0.5 mg by mouth See admin instructions. Take 1 tablet (0.5 mg) by mouth twice daily, may also take 1 tablet in the afternoon as needed for anxiety 03/13/16  Yes Peter M [name], MD  amiodarone (PACERONE) 200 MG tablet Take 1 tablet (200 mg total) by mouth daily. 01/31/16  Yes Peter M [name], MD  calcitRIOL (ROCALTROL) 0.25 MCG capsule Take 1 capsule (0.25 mcg total) by mouth daily. 04/03/16  Yes Peter M [name], MD  carvedilol (COREG) 25 MG tablet Take 0.5 tablets (12.5 mg total) by mouth 2 (two) times daily. 04/05/16 04/05/17 Yes Peter M [name], MD  Cyanocobalamin (VITAMIN B-12 PO) Take 1 tablet by mouth daily.   Yes Historical Provider, MD  digoxin (LANOXIN) 0.125 MG tablet Take 1/2 tablet 0.0625 mg every other day Patient taking differently: Take 0.0625 mg by mouth every other day. Take 1/2 tablet  every other day 01/31/16  Yes Peter M [name], MD  furosemide (LASIX) 40 MG tablet Take 1 tablet (40 mg total) by mouth 2 (two) times daily. 01/31/16  Yes Peter M [name], MD  levothyroxine (SYNTHROID, LEVOTHROID) 50 MCG tablet Take 1 tablet (50 mcg total) by mouth daily before breakfast. 03/13/16  Yes Peter M [name], MD  loperamide (IMODIUM A-D) 2 MG tablet Take 2 mg by mouth 4 (four) times daily as needed for diarrhea or loose stools.   Yes Historical Provider, MD  lovastatin (MEVACOR) 40 MG tablet Take 1 tablet (40 mg total) by mouth at bedtime. 01/31/16  Yes Peter M [name], MD  Multiple Vitamins-Minerals (PRESERVISION AREDS 2) CAPS Take 1 capsule by mouth daily.     Yes Historical Provider, MD  OVER THE COUNTER MEDICATION Place 1 patch onto the skin daily as needed (pain). OTC pain patch   Yes Historical Provider, MD  OXYGEN Inhale 2 L into the lungs continuous.   Yes Historical Provider, MD  potassium chloride (K-DUR) 10 MEQ tablet Take 1 tablet (10 mEq total) by mouth daily. 01/31/16  Yes Peter M [name], MD  warfarin (COUMADIN) 1 MG tablet Take as directed by coumadin clinic Patient taking differently: Take 0.5 mg by mouth daily. or as directed by coumadin clinic 01/31/16  Yes Peter M [name], MD    Physical Exam: Filed Vitals:   05/05/16 2145 05/05/16 2200 05/05/16 2215 05/05/16 2300  BP: 95/62 99/51 107/60 100/57  Pulse: 60 59 59 59  Temp:      TempSrc:      Resp: 22 27 21 26   SpO2: 95% 96% 89% 98%      Constitutional: Patient is a chronically ill-appearing male, in mild respiratory distress,  able to follow commands, and cooperate with exam. Filed Vitals:   05/05/16 2145 05/05/16 2200 05/05/16 2215 05/05/16 2300  BP: 95/62 99/51 107/60 100/57  Pulse: 60 59 59 59  Temp:      TempSrc:      Resp: 22 27 21 26   SpO2: 95% 96% 89% 98%   Eyes: PERRL, lids and conjunctivae normal ENMT: Mucous membranes are moist. Posterior pharynx clear of any exudate or lesions.Normal dentition.  Neck: normal, supple, Positive JVD to the angle of the jaw Respiratory: Tachypneic, expiratory wheezes appreciated, patient able to speak in shortened sentences.  Cardiovascular: Regular rate and rhythm, + murmur , no rubs / gallops. +1 pitting lower extremity edema noted bilaterally  2+ pedal pulses. No carotid bruits.  Abdomen: no tenderness, no masses palpated. No hepatosplenomegaly. Bowel sounds positive.  Musculoskeletal: no clubbing / cyanosis. No joint deformity upper and lower extremities. Good ROM, no contractures. Normal muscle tone.  Skin: no rashes, lesions, ulcers. No induration Neurologic: CN 2-12 grossly intact. Sensation intact, DTR normal. Strength 5/5  in all 4.  Psychiatric: Normal judgment and insight. Alert and oriented x 3. Normal mood.     Labs on Admission: I have personally reviewed following labs and imaging studies  CBC:  Recent Labs Lab 05/05/16 2040  WBC 13.7*  HGB 10.9*  HCT 35.4*  MCV 91.5  PLT 158   Basic Metabolic Panel:  Recent Labs Lab 05/05/16 2040  NA 132*  K 4.4  CL 105  CO2 19*  GLUCOSE 112*  BUN 44*  CREATININE 2.61*  CALCIUM 8.4*   GFR: CrCl cannot be calculated (Unknown ideal weight.). Liver Function Tests: No results for input(s): AST, ALT, ALKPHOS, BILITOT, PROT, ALBUMIN in the last 168 hours. No results for input(s): LIPASE, AMYLASE in the last 168 hours. No results for input(s): AMMONIA in the last 168 hours. Coagulation Profile:  Recent Labs Lab 05/05/16 2241  INR 2.28*   Cardiac Enzymes: No results for input(s): CKTOTAL, CKMB, CKMBINDEX, TROPONINI in the last 168 hours. BNP (last 3 results) No results for input(s): PROBNP in the last 8760 hours. HbA1C: No results for input(s): HGBA1C in the last 72 hours. CBG: No results for input(s): GLUCAP in the last 168 hours. Lipid Profile: No results for input(s): CHOL, HDL, LDLCALC, TRIG, CHOLHDL, LDLDIRECT in the last 72 hours. Thyroid Function Tests: No results for input(s): TSH, T4TOTAL, FREET4, T3FREE, THYROIDAB in the last 72 hours. Anemia Panel: No results for input(s): VITAMINB12, FOLATE, FERRITIN, TIBC, IRON, RETICCTPCT in the last 72 hours. Urine analysis: No results found for: COLORURINE, APPEARANCEUR, LABSPEC, PHURINE, GLUCOSEU, HGBUR, BILIRUBINUR, KETONESUR, PROTEINUR, UROBILINOGEN, NITRITE, LEUKOCYTESUR Sepsis Labs: No results found for this or any previous visit (from the past 240 hour(s)).   Radiological Exams on Admission: Dg Chest 2 View  05/05/2016  CLINICAL DATA:  Increasing shortness of Breath EXAM: CHEST  2 VIEW COMPARISON:  01/24/2013 FINDINGS: Cardiac shadow is stable. Defibrillator is again noted and  stable. Lungs are well aerated bilaterally. Diffuse interstitial changes are seen. Additionally increased density is noted in the bases bilaterally consistent with some acute on chronic infiltrate. No bony abnormality is seen. Postsurgical changes are noted. IMPRESSION: Acute on chronic bibasilar infiltrate. Electronically Signed   By: Eulah Pont.D.  On: 05/05/2016 21:03    EKG: Independently reviewed. Ventricularly paced rhythm  Assessment/Plan Acute systolic CHF exacerbation/: Patient's last EF noted to be 20-25% back in 05/2014. Patient presents with acute complaints of shortness of breath found have elevated BNP of 806. - Admit to a stepdown bed - Strict I&Os and daily weights - Lasix 40 mg IV given once in ED, reassess in a.m. - Cancelled echocardiogram as family reports patient recently had an echo. will have to check with family in a.m. to verify this - will need to Consult cardiology in am patient follow by Dr. Ladona Ridgel as outpatient  Transient hypotension question secondary to fluid overload - Gentle IV fluids if needed, if maps less than 65  Acute on Chronic respiratory failure with hypoxia: Patient normally on 2 L nasal cannula oxygen acutely requiring increased 4 L to maintain O2 saturations. Physical exam reveals wheezing. - Continue pulse oximetry overnight with nasal cannula oxygen to keep O2 sats greater than 92%  Question community-acquired pneumonia : Patient with cough and congestion with questionable acute on chronic bibasilar infiltrate. Symptoms could be secondary to acute fluid overload versus underlying infection as white count was noted to be elevated at 13.7. - Follow up blood and sputum cultures - Continue azithromycin and Rocephin  Atrial fibrillation s/p PM: Chadsvasc>4  Digoxin level therapeutic at 1.5 and  INR 2.28. Heart rates rate controlled. - Continue amiodarone, digoxin - Warfarin per pharmacy  Hypothyroidism -  Check tsh - continue  Levothyroxine  Anxiety - Continue Xanax prn DVT prophylaxis: On Coumadin  Code Status: Full Family Communication: Discussed plan with daughter present at bed Disposition Plan: Possible discharge in 3-5 days Consults called:none Admission status: Inpatient stepdown  Clydie Braun MD [facility] Pager [phone]  If 7PM-7AM, please contact night-coverage [email or web address] Password [email or web address]  05/06/2016, 12:12 AM

## 2016-05-06 NOTE — Consult Note (Signed)
CONSULTATION NOTE  Reason for Consult: CHF  Requesting Physician: Dr. Karleen Hampshire  Cardiologist: Dr. Martinique  HPI: This is a 75 y.o. male with a past medical history significant for ischemic cardiomyopathy with EF 20-25% by echo in June 2015. He has a history of CABGx3 in the past in 2001 with a LIMA to LAD, SVG to OM and SVG to PDA. His last cardiac catheterization 2005 showed patent grafts. He has chronic atrial fibrillation. He also has a biventricular ICD which was placed for history of ventricular tachycardia. That device was revised in 2013. In 2015 he had an ICD shock and was started on amiodarone and as of March 2017 his Optival measurements were normal indicating compensated heart failure. He was last seen by Dr. Martinique in the office on April 28 and felt to be well compensated. His INR was 2.7 on warfarin. He denied any recurrent ICD shocks.  He now presents with increasing shortness of breath over the past 2-3 days. This initially started with a cough which became productive with thick brownish sputum production. There was associated wheezing and on arrival he was noted to be hypoxic. Apparently he had a recent chest x-ray and ultrasound of his abdomen which is performed at [facility], but those results are not available. He has not had an echo since 2015. ICD was interrogated in the ER however I cannot find those records. Chest x-ray shows acute on chronic basilar infiltrate. BNP on admission was 806 and troponin was negative at 0.01. Creatinine was elevated at 2.6. His baseline creatinine is around 2.4 on my review. EKG shows biventricular pacing at 60. He reports his breathing has improved somewhat today but is still rhonchorous with a productive cough and wheezing.  PMHx:  Past Medical History  Diagnosis Date  . Coronary artery disease   . Cardiomyopathy, ischemic     EF of 30-35%  . Status post CVA   . VT (ventricular tachycardia) ([place]) aug of 2010    removed old ICD and  replaced with biventricular ICD  . CKD (chronic kidney disease) stage 3, GFR 30-59 ml/min   . Atrial fibrillation (HCC)     chronic  . Hypertension   . Dyslipidemia   . Peripheral vascular disease ([place])   . Thyroid disease     hypo,due to amiodarone therapy,resolved  . Myocardial infarction [facility]) January 2001     History of anteroseptal myocardial infarction presenting with left bundle-branch block  . Anxiety   . Tobacco abuse     History of 2 pack per day tobacco habituation  . Pacemaker   . CHF (congestive heart failure) [facility])    Past Surgical History  Procedure Laterality Date  . Cholecystectomy    . Femoral bypass      fem-fem bpg, right fem pop bpg  . Icd  07/15/2009    implant Dr. Lovena Le  . Coronary artery bypass graft  2001    x 4  . Cardiac catheterization  2001    PTCA  . Cardiac catheterization  2005    patent grafts  . Cataracts  2006    right eye  . Implantable cardioverter defibrillator (icd) generator change N/A 11/28/2012    Procedure: ICD GENERATOR CHANGE;  Surgeon: Evans Lance, MD;  Location: [facility] CATH LAB;  Service: Cardiovascular;  Laterality: N/A;    FAMHx: Family History  Problem Relation Age of Onset  . Heart disease Mother   . Aneurysm Brother   . Stroke Mother   .  Stroke Father     SOCHx:  reports that he quit smoking about 15 years ago. He does not have any smokeless tobacco history on file. His alcohol and drug histories are not on file.  ALLERGIES: Allergies  Allergen Reactions  . Nitroglycerin Other (See Comments)    Became severely hypotensive  . Other     Intolerance to ACE inhibitors UNKNOWN REACTION    ROS: Pertinent items noted in HPI and remainder of comprehensive ROS otherwise negative.  HOME MEDICATIONS:   Medication List    ASK your doctor about these medications        acetaminophen 500 MG tablet  Commonly known as:  TYLENOL  Take 1,000 mg by mouth every 6 (six) hours as needed (pain).     allopurinol 100 MG  tablet  Commonly known as:  ZYLOPRIM  Take 1 tablet (100 mg total) by mouth daily.     ALPRAZolam 0.5 MG tablet  Commonly known as:  XANAX  Take 1 tablet (0.5 mg total) by mouth 3 (three) times daily as needed for anxiety.     amiodarone 200 MG tablet  Commonly known as:  PACERONE  Take 1 tablet (200 mg total) by mouth daily.     calcitRIOL 0.25 MCG capsule  Commonly known as:  ROCALTROL  Take 1 capsule (0.25 mcg total) by mouth daily.     carvedilol 25 MG tablet  Commonly known as:  COREG  Take 0.5 tablets (12.5 mg total) by mouth 2 (two) times daily.     digoxin 0.125 MG tablet  Commonly known as:  LANOXIN  Take 1/2 tablet 0.0625 mg every other day     furosemide 40 MG tablet  Commonly known as:  LASIX  Take 1 tablet (40 mg total) by mouth 2 (two) times daily.     levothyroxine 50 MCG tablet  Commonly known as:  SYNTHROID, LEVOTHROID  Take 1 tablet (50 mcg total) by mouth daily before breakfast.     loperamide 2 MG tablet  Commonly known as:  IMODIUM A-D  Take 2 mg by mouth 4 (four) times daily as needed for diarrhea or loose stools.     lovastatin 40 MG tablet  Commonly known as:  MEVACOR  Take 1 tablet (40 mg total) by mouth at bedtime.     OVER THE COUNTER MEDICATION  Place 1 patch onto the skin daily as needed (pain). OTC pain patch     OXYGEN  Inhale 2 L into the lungs continuous.     potassium chloride 10 MEQ tablet  Commonly known as:  K-DUR  Take 1 tablet (10 mEq total) by mouth daily.     PRESERVISION AREDS 2 Caps  Take 1 capsule by mouth daily.     VITAMIN B-12 PO  Take 1 tablet by mouth daily.     warfarin 1 MG tablet  Commonly known as:  COUMADIN  Take as directed by coumadin clinic        HOSPITAL MEDICATIONS: Prior to Admission:  Prescriptions prior to admission  Medication Sig Dispense Refill Last Dose  . acetaminophen (TYLENOL) 500 MG tablet Take 1,000 mg by mouth every 6 (six) hours as needed (pain).   05/04/2016 at Unknown time  .  allopurinol (ZYLOPRIM) 100 MG tablet Take 1 tablet (100 mg total) by mouth daily. 90 tablet 3 05/05/2016 at Unknown time  . ALPRAZolam (XANAX) 0.5 MG tablet Take 1 tablet (0.5 mg total) by mouth 3 (three) times daily as needed for anxiety. (  Patient taking differently: Take 0.5 mg by mouth See admin instructions. Take 1 tablet (0.5 mg) by mouth twice daily, may also take 1 tablet in the afternoon as needed for anxiety) 90 tablet 1 05/05/2016 at am  . amiodarone (PACERONE) 200 MG tablet Take 1 tablet (200 mg total) by mouth daily. 90 tablet 3 05/05/2016 at Unknown time  . calcitRIOL (ROCALTROL) 0.25 MCG capsule Take 1 capsule (0.25 mcg total) by mouth daily. 90 capsule 1 05/05/2016 at Unknown time  . carvedilol (COREG) 25 MG tablet Take 0.5 tablets (12.5 mg total) by mouth 2 (two) times daily. 180 tablet 3 05/05/2016 at 900  . Cyanocobalamin (VITAMIN B-12 PO) Take 1 tablet by mouth daily.   05/05/2016 at Unknown time  . digoxin (LANOXIN) 0.125 MG tablet Take 1/2 tablet 0.0625 mg every other day (Patient taking differently: Take 0.0625 mg by mouth every other day. Take 1/2 tablet  every other day) 45 tablet 3 05/04/2016 at Unknown time  . furosemide (LASIX) 40 MG tablet Take 1 tablet (40 mg total) by mouth 2 (two) times daily. 180 tablet 3 05/05/2016 at am  . levothyroxine (SYNTHROID, LEVOTHROID) 50 MCG tablet Take 1 tablet (50 mcg total) by mouth daily before breakfast. 90 tablet 3 05/05/2016 at Unknown time  . loperamide (IMODIUM A-D) 2 MG tablet Take 2 mg by mouth 4 (four) times daily as needed for diarrhea or loose stools.   05/05/2016 at am  . lovastatin (MEVACOR) 40 MG tablet Take 1 tablet (40 mg total) by mouth at bedtime. 90 tablet 3 05/04/2016 at Unknown time  . Multiple Vitamins-Minerals (PRESERVISION AREDS 2) CAPS Take 1 capsule by mouth daily.    05/05/2016 at Unknown time  . OVER THE COUNTER MEDICATION Place 1 patch onto the skin daily as needed (pain). OTC pain patch   05/04/2016 at Unknown time  . OXYGEN  Inhale 2 L into the lungs continuous.   05/05/2016 at Unknown time  . potassium chloride (K-DUR) 10 MEQ tablet Take 1 tablet (10 mEq total) by mouth daily. 90 tablet 3 05/05/2016 at Unknown time  . warfarin (COUMADIN) 1 MG tablet Take as directed by coumadin clinic (Patient taking differently: Take 0.5 mg by mouth daily. or as directed by coumadin clinic) 90 tablet 0 05/05/2016 at 900    VITALS: Blood pressure 91/55, pulse 60, temperature 97.8 F (36.6 C), temperature source Oral, resp. rate 24, height _0  (1.651 m), weight 135 lb 9.3 oz (61.5 kg), SpO2 93 %.  PHYSICAL EXAM: General appearance: alert, appears older than stated age and mild distress Neck: JVD - several cm above sternal notch, no carotid bruit and Possible scleral icterus Lungs: diminished breath sounds bilaterally, rhonchi bilaterally and wheezes bilaterally Heart: regular rate and rhythm Abdomen: soft, non-tender; bowel sounds normal; no masses,  no organomegaly Extremities: extremities normal, atraumatic, no cyanosis or edema Pulses: 2+ and symmetric Skin: Mild jaundice Neurologic: Grossly normal Psych: Pleasant  LABS: Results for orders placed or performed during the hospital encounter of 05/05/16 (from the past 48 hour(s))  Basic metabolic panel     Status: Abnormal   Collection Time: 05/05/16  8:40 PM  Result Value Ref Range   Sodium 132 (L) 135 - 145 mmol/L   Potassium 4.4 3.5 - 5.1 mmol/L   Chloride 105 101 - 111 mmol/L   CO2 19 (L) 22 - 32 mmol/L   Glucose, Bld 112 (H) 65 - 99 mg/dL   BUN 44 (H) 6 - 20 mg/dL   Creatinine,  Ser 2.61 (H) 0.61 - 1.24 mg/dL   Calcium 8.4 (L) 8.9 - 10.3 mg/dL   GFR calc non Af Amer 22 (L) >60 mL/min   GFR calc Af Amer 26 (L) >60 mL/min    Comment: (NOTE) The eGFR has been calculated using the CKD EPI equation. This calculation has not been validated in all clinical situations. eGFR's persistently <60 mL/min signify possible Chronic Kidney Disease.    Anion gap 8 5 - 15  CBC      Status: Abnormal   Collection Time: 05/05/16  8:40 PM  Result Value Ref Range   WBC 13.7 (H) 4.0 - 10.5 K/uL   RBC 3.87 (L) 4.22 - 5.81 MIL/uL   Hemoglobin 10.9 (L) 13.0 - 17.0 g/dL   HCT 35.4 (L) 39.0 - 52.0 %   MCV 91.5 78.0 - 100.0 fL   MCH 28.2 26.0 - 34.0 pg   MCHC 30.8 30.0 - 36.0 g/dL   RDW 16.2 (H) 11.5 - 15.5 %   Platelets 158 150 - 400 K/uL  Brain natriuretic peptide     Status: Abnormal   Collection Time: 05/05/16  8:40 PM  Result Value Ref Range   B Natriuretic Peptide 806.5 (H) 0.0 - 100.0 pg/mL  I-stat troponin, ED     Status: None   Collection Time: 05/05/16  8:48 PM  Result Value Ref Range   Troponin i, poc 0.01 0.00 - 0.08 ng/mL   Comment 3            Comment: Due to the release kinetics of cTnI, a negative result within the first hours of the onset of symptoms does not rule out myocardial infarction with certainty. If myocardial infarction is still suspected, repeat the test at appropriate intervals.   Protime-INR     Status: Abnormal   Collection Time: 05/05/16 10:41 PM  Result Value Ref Range   Prothrombin Time 24.9 (H) 11.6 - 15.2 seconds   INR 2.28 (H) 0.00 - 1.49  Digoxin level     Status: None   Collection Time: 05/05/16 10:41 PM  Result Value Ref Range   Digoxin Level 1.5 0.8 - 2.0 ng/mL  I-Stat CG4 Lactic Acid, ED     Status: None   Collection Time: 05/05/16 10:49 PM  Result Value Ref Range   Lactic Acid, Venous 1.90 0.5 - 2.0 mmol/L  I-Stat CG4 Lactic Acid, ED     Status: Abnormal   Collection Time: 05/06/16  1:19 AM  Result Value Ref Range   Lactic Acid, Venous 2.57 (HH) 0.5 - 2.0 mmol/L   Comment NOTIFIED PHYSICIAN   MRSA PCR Screening     Status: None   Collection Time: 05/06/16  1:49 AM  Result Value Ref Range   MRSA by PCR NEGATIVE NEGATIVE    Comment:        The GeneXpert MRSA Assay (FDA approved for NASAL specimens only), is one component of a comprehensive MRSA colonization surveillance program. It is not intended to  diagnose MRSA infection nor to guide or monitor treatment for MRSA infections.   CBC WITH DIFFERENTIAL     Status: Abnormal   Collection Time: 05/06/16  3:11 AM  Result Value Ref Range   WBC 13.8 (H) 4.0 - 10.5 K/uL   RBC 3.74 (L) 4.22 - 5.81 MIL/uL   Hemoglobin 10.8 (L) 13.0 - 17.0 g/dL   HCT 34.3 (L) 39.0 - 52.0 %   MCV 91.7 78.0 - 100.0 fL   MCH 28.9 26.0 - 34.0  pg   MCHC 31.5 30.0 - 36.0 g/dL   RDW 16.2 (H) 11.5 - 15.5 %   Platelets 163 150 - 400 K/uL   Neutrophils Relative % 78 %   Neutro Abs 10.8 (H) 1.7 - 7.7 K/uL   Lymphocytes Relative 10 %   Lymphs Abs 1.4 0.7 - 4.0 K/uL   Monocytes Relative 11 %   Monocytes Absolute 1.5 (H) 0.1 - 1.0 K/uL   Eosinophils Relative 1 %   Eosinophils Absolute 0.1 0.0 - 0.7 K/uL   Basophils Relative 0 %   Basophils Absolute 0.0 0.0 - 0.1 K/uL  TSH     Status: Abnormal   Collection Time: 05/06/16  3:11 AM  Result Value Ref Range   TSH 11.646 (H) 0.350 - 4.500 uIU/mL  Protime-INR     Status: Abnormal   Collection Time: 05/06/16  3:11 AM  Result Value Ref Range   Prothrombin Time 25.4 (H) 11.6 - 15.2 seconds   INR 2.34 (H) 0.00 - 1.49  Strep pneumoniae urinary antigen     Status: Abnormal   Collection Time: 05/06/16  5:30 AM  Result Value Ref Range   Strep Pneumo Urinary Antigen POSITIVE (A) NEGATIVE    IMAGING: Dg Chest 2 View  05/05/2016  CLINICAL DATA:  Increasing shortness of Breath EXAM: CHEST  2 VIEW COMPARISON:  01/24/2013 FINDINGS: Cardiac shadow is stable. Defibrillator is again noted and stable. Lungs are well aerated bilaterally. Diffuse interstitial changes are seen. Additionally increased density is noted in the bases bilaterally consistent with some acute on chronic infiltrate. No bony abnormality is seen. Postsurgical changes are noted. IMPRESSION: Acute on chronic bibasilar infiltrate. Electronically Signed   By: Inez Catalina M.D.   On: 05/05/2016 21:03    HOSPITAL DIAGNOSES: Principal Problem:   Acute congestive  heart failure ([place]) Active Problems:   Essential hypertension   Atrial fibrillation (HCC)   Hypothyroidism (acquired)   CAP (community acquired pneumonia)   IMPRESSION: 1. Acute systolic congestive heart failure 2. Community-acquired strep pneumonia 3. Atrial fibrillation on warfarin 4. Status post AICD with biventricular pacing 5. Hypothyroidism with TSH over 11 6. Jaundice  RECOMMENDATION: 1. [name] is an acute systolic congestive heart failure as a result of a strep pneumonia. He reports some mild improvement in his breathing. He is currently on Lasix 40 mg IV twice a day. Will repeat echocardiogram as this was not performed as an outpatient and EF was last assessed in 2015. I would also recommend checking liver enzymes as well as bilirubin (total/fractionated) as he appears somewhat jaundiced on exam with mild scleral icterus. This could be from congestive hepatopathy, however he is on amiodarone.  Thanks for the consultation. Cardiology will follow with you closely.  Time Spent Directly with Patient: 45 minutes  Pixie Casino, MD, [facility] Attending Cardiologist [place] 05/06/2016, 12:44 PM

## 2016-05-07 LAB — RETICULOCYTES
RBC.: 3.56 MIL/uL — AB (ref 4.22–5.81)
RETIC COUNT ABSOLUTE: 71.2 10*3/uL (ref 19.0–186.0)
Retic Ct Pct: 2 % (ref 0.4–3.1)

## 2016-05-07 LAB — IRON AND TIBC
Iron: 20 ug/dL — ABNORMAL LOW (ref 45–182)
SATURATION RATIOS: 12 % — AB (ref 17.9–39.5)
TIBC: 164 ug/dL — ABNORMAL LOW (ref 250–450)
UIBC: 144 ug/dL

## 2016-05-07 LAB — BASIC METABOLIC PANEL
Anion gap: 9 (ref 5–15)
BUN: 47 mg/dL — AB (ref 6–20)
CALCIUM: 8.6 mg/dL — AB (ref 8.9–10.3)
CO2: 22 mmol/L (ref 22–32)
Chloride: 103 mmol/L (ref 101–111)
Creatinine, Ser: 2.34 mg/dL — ABNORMAL HIGH (ref 0.61–1.24)
GFR calc Af Amer: 29 mL/min — ABNORMAL LOW (ref >60)
GFR, EST NON AFRICAN AMERICAN: 25 mL/min — AB (ref >60)
Glucose, Bld: 95 mg/dL (ref 65–99)
POTASSIUM: 4 mmol/L (ref 3.5–5.1)
SODIUM: 134 mmol/L — AB (ref 135–145)

## 2016-05-07 LAB — LACTIC ACID, PLASMA: LACTIC ACID, VENOUS: 2 mmol/L (ref 0.5–2.0)

## 2016-05-07 LAB — CBC
HCT: 32.2 % — ABNORMAL LOW (ref 39.0–52.0)
Hemoglobin: 10.1 g/dL — ABNORMAL LOW (ref 13.0–17.0)
MCH: 28.4 pg (ref 26.0–34.0)
MCHC: 31.4 g/dL (ref 30.0–36.0)
MCV: 90.4 fL (ref 78.0–100.0)
PLATELETS: 151 10*3/uL (ref 150–400)
RBC: 3.56 MIL/uL — ABNORMAL LOW (ref 4.22–5.81)
RDW: 16.1 % — AB (ref 11.5–15.5)
WBC: 7.8 10*3/uL (ref 4.0–10.5)

## 2016-05-07 LAB — FOLATE: FOLATE: 5.5 ng/mL — AB (ref >5.9)

## 2016-05-07 LAB — T4, FREE: Free T4: 1.33 ng/dL — ABNORMAL HIGH (ref 0.61–1.12)

## 2016-05-07 LAB — PROTIME-INR
INR: 2.61 — ABNORMAL HIGH (ref 0.00–1.49)
PROTHROMBIN TIME: 27.6 s — AB (ref 11.6–15.2)

## 2016-05-07 LAB — VITAMIN B12: VITAMIN B 12: 4769 pg/mL — AB (ref 180–914)

## 2016-05-07 LAB — FERRITIN: Ferritin: 336 ng/mL (ref 24–336)

## 2016-05-07 MED ORDER — FOLIC ACID 1 MG PO TABS
1.0000 mg | ORAL_TABLET | Freq: Every day | ORAL | Status: DC
  Administered 2016-05-07 – 2016-05-12 (×6): 1 mg via ORAL
  Filled 2016-05-07 (×6): qty 1

## 2016-05-07 NOTE — Progress Notes (Signed)
Patient is no longer on HFNC. Patient is on [place] with humidity on 4L. Patient is sat well and in no distress.

## 2016-05-07 NOTE — Progress Notes (Signed)
SUBJECTIVE: The patient is doing well today.  At this time, he denies chest pain, shortness of breath, or any new concerns. He says his breathing is much better today. He has not gotten out of bed and walked. He has diuresed approximately 1 L of fluid.  Marland Kitchen ALPRAZolam  0.5 mg Oral BID  . amiodarone  200 mg Oral Daily  . antiseptic oral rinse  7 mL Mouth Rinse BID  . calcitRIOL  0.25 mcg Oral Daily  . carvedilol  12.5 mg Oral BID  . cefTRIAXone (ROCEPHIN)  IV  1 g Intravenous Q24H  . digoxin  0.0625 mg Oral QODAY  . furosemide  40 mg Intravenous Q12H  . guaiFENesin  600 mg Oral BID  . ipratropium-albuterol  3 mL Nebulization Once  . levothyroxine  50 mcg Oral QAC breakfast  . multivitamin  1 tablet Oral Daily  . potassium chloride  10 mEq Oral Daily  . pravastatin  40 mg Oral q1800  . sodium chloride flush  3 mL Intravenous Q12H  . warfarin  0.5 mg Oral q1800  . Warfarin - Pharmacist Dosing Inpatient   Does not apply q1800      OBJECTIVE: Physical Exam: Filed Vitals:   05/06/16 2049 05/07/16 0000 05/07/16 0258 05/07/16 0515  BP:  89/56  92/57  Pulse: 59 59 58 60  Temp:  97.9 F (36.6 C)  97.9 F (36.6 C)  TempSrc:    Oral  Resp: 17 25 22 18   Height:      Weight:    135 lb 3.2 oz (61.326 kg)  SpO2: 94% 95% 97% 94%    Intake/Output Summary (Last 24 hours) at 05/07/16 0756 Last data filed at 05/07/16 0715  Gross per 24 hour  Intake    290 ml  Output   1225 ml  Net   -935 ml    Telemetry reveals sinus rhythm  GEN- The patient is well appearing, alert and oriented x 3 today.   Head- normocephalic, atraumatic Eyes-  Sclera clear, conjunctiva pink Ears- hearing intact Oropharynx- clear Neck- supple, 12 cm JVP Lymph- no cervical lymphadenopathy Lungs- Clear to ausculation bilaterally, normal work of breathing Heart- Regular rate and rhythm, no murmurs, rubs or gallops, PMI not laterally displaced GI- soft, NT, ND, + BS Extremities- no clubbing, cyanosis, or  edema Skin- no rash or lesion Psych- euthymic mood, full affect Neuro- strength and sensation are intact  LABS: Basic Metabolic Panel:  Recent Labs  18/84/16 2040 05/07/16 0142  NA 132* 134*  K 4.4 4.0  CL 105 103  CO2 19* 22  GLUCOSE 112* 95  BUN 44* 47*  CREATININE 2.61* 2.34*  CALCIUM 8.4* 8.6*   Liver Function Tests:  Recent Labs  05/06/16 1445  AST 29  ALT 14*  ALKPHOS 129*  BILITOT 1.0  PROT 6.2*  ALBUMIN 2.2*   No results for input(s): LIPASE, AMYLASE in the last 72 hours. CBC:  Recent Labs  05/06/16 0311 05/07/16 0142  WBC 13.8* 7.8  NEUTROABS 10.8*  --   HGB 10.8* 10.1*  HCT 34.3* 32.2*  MCV 91.7 90.4  PLT 163 151   Cardiac Enzymes: No results for input(s): CKTOTAL, CKMB, CKMBINDEX, TROPONINI in the last 72 hours. BNP: Invalid input(s): POCBNP D-Dimer: No results for input(s): DDIMER in the last 72 hours. Hemoglobin A1C: No results for input(s): HGBA1C in the last 72 hours. Fasting Lipid Panel: No results for input(s): CHOL, HDL, LDLCALC, TRIG, CHOLHDL, LDLDIRECT in the last 72 hours.  Thyroid Function Tests:  Recent Labs  05/06/16 0311  TSH 11.646*   Anemia Panel:  Recent Labs  05/07/16 0142  VITAMINB12 4769*  FOLATE 5.5*  FERRITIN 336  TIBC 164*  IRON 20*  RETICCTPCT 2.0    RADIOLOGY: Dg Chest 2 View  05/05/2016  CLINICAL DATA:  Increasing shortness of Breath EXAM: CHEST  2 VIEW COMPARISON:  01/24/2013 FINDINGS: Cardiac shadow is stable. Defibrillator is again noted and stable. Lungs are well aerated bilaterally. Diffuse interstitial changes are seen. Additionally increased density is noted in the bases bilaterally consistent with some acute on chronic infiltrate. No bony abnormality is seen. Postsurgical changes are noted. IMPRESSION: Acute on chronic bibasilar infiltrate. Electronically Signed   By: Alcide Clever M.D.   On: 05/05/2016 21:03    ASSESSMENT AND PLAN:  Principal Problem:   Acute congestive heart failure  (HCC) Active Problems:   Essential hypertension   Atrial fibrillation (HCC)   Hypothyroidism (acquired)   CAP (community acquired pneumonia) Acute systolic congestive heart failure: Lungs sound clearer today, but does have continued to have significant JVD. He says that he has been feeling better over the course of the day, with improved breathing. We'll continue him on his IV Lasix through the day today, with a plan to potentially transition to by mouth tomorrow. I told him to try and get out of the bed and move around a little bit more today. He is on high flow oxygen I have asked the nurses to try and wean his oxygen back to his baseline.   Shameek Nyquist Jorja Loa, MD 05/07/2016 7:56 AM

## 2016-05-07 NOTE — Progress Notes (Signed)
PROGRESS NOTE    [name] [name]  WUJ:[id number] DOB: [birth year] DOA: 05/05/2016 PCP: Pamelia Hoit, MD    Brief Narrative: [name] [name] is a 75 y.o. male with medical history significant of systolic CHF last EF 20-25% in 05/2014, ischemic cardiomyopathy, s/p PMCAD, CKD stage III; who presents with worsening shortness of breath and cough over the last 2-3 days.   Assessment & Plan:   Principal Problem:   Acute congestive heart failure (HCC) Active Problems:   Essential hypertension   Atrial fibrillation (HCC)   Hypothyroidism (acquired)   CAP (community acquired pneumonia)   Acute respiratory failure with hypoxia; secondary to streptococcal pneumonia, and acute on chronic systolic heart failure.  On high flow oxygen , keep oxygen sats greater than 90%. Currently on 7 lit . HIV screen negative.  Resume iV rocephin. Urine for strept antigen positive.  Echocardiogram ordered.  Started on IV lasix, strict intake and output.  Daily weights.    Atrial fibrillation: Rate controlled on coumadin.  INR therapeutic.  Amiodarone    Hypothyroidsim: Resume synthroid.  TSH elevated, free t4 is elevated, pending free t3.    Anemia: Normocytic.  Get anemia panel shows low folate levels.  Start folate supplements.    Hypertension: Pt baseline bp runs around 80/50's.     Sepsis from community acquired pneumonia; Elevated lactic acid, tachypnea, hypotension, strept pneumonia. On admission.  Repeat lactic acid has improved.  Resume IV antibiotics.  Cultures are pending.  .      DVT prophylaxis: (Lovenox) Code Status: (Full Family Communication: family at bedside.  Disposition Plan: pending further management.    Consultants:   Cardiology.   Procedures: repeat ECHO  Antimicrobials: rocephin  Zithromax.   Subjective: Reports sob improved.  Objective: Filed Vitals:   05/07/16 0515 05/07/16 0751 05/07/16 0800 05/07/16 0846  BP: 92/57  91/58 91/58   Pulse: 60 60 59 61  Temp: 97.9 F (36.6 C)     TempSrc: Oral     Resp: 18 23 26 24   Height:      Weight: 61.326 kg (135 lb 3.2 oz)     SpO2: 94% 91% 90% 94%    Intake/Output Summary (Last 24 hours) at 05/07/16 0946 Last data filed at 05/07/16 0740  Gross per 24 hour  Intake    410 ml  Output   1350 ml  Net   -940 ml   Filed Weights   05/06/16 0115 05/07/16 0515  Weight: 61.5 kg (135 lb 9.3 oz) 61.326 kg (135 lb 3.2 oz)    Examination:  General exam: Anxious.  Respiratory system: basilar crackles, increased work of breathing, scattered wheezing.  Cardiovascular system: S1 & S2 heard, RRR. No JVD, murmurs, rubs, gallops or clicks. No pedal edema. Gastrointestinal system: Abdomen is nondistended, soft and nontender. No organomegaly or masses felt. Normal bowel sounds heard. Central nervous system: Alert and oriented. No focal neurological deficits. Extremities: Symmetric 5 x 5 power. Skin: No rashes, lesions or ulcers     Data Reviewed: I have personally reviewed following labs and imaging studies  CBC:  Recent Labs Lab 05/05/16 2040 05/06/16 0311 05/07/16 0142  WBC 13.7* 13.8* 7.8  NEUTROABS  --  10.8*  --   HGB 10.9* 10.8* 10.1*  HCT 35.4* 34.3* 32.2*  MCV 91.5 91.7 90.4  PLT 158 163 151   Basic Metabolic Panel:  Recent Labs Lab 05/05/16 2040 05/07/16 0142  NA 132* 134*  K 4.4 4.0  CL 105 103  CO2 19*  22  GLUCOSE 112* 95  BUN 44* 47*  CREATININE 2.61* 2.34*  CALCIUM 8.4* 8.6*   GFR: Estimated Creatinine Clearance: 22.9 mL/min (by C-G formula based on Cr of 2.34). Liver Function Tests:  Recent Labs Lab 05/06/16 1445  AST 29  ALT 14*  ALKPHOS 129*  BILITOT 1.0  PROT 6.2*  ALBUMIN 2.2*   No results for input(s): LIPASE, AMYLASE in the last 168 hours. No results for input(s): AMMONIA in the last 168 hours. Coagulation Profile:  Recent Labs Lab 05/05/16 2241 05/06/16 0311 05/07/16 0142  INR 2.28* 2.34* 2.61*   Cardiac Enzymes: No  results for input(s): CKTOTAL, CKMB, CKMBINDEX, TROPONINI in the last 168 hours. BNP (last 3 results) No results for input(s): PROBNP in the last 8760 hours. HbA1C: No results for input(s): HGBA1C in the last 72 hours. CBG: No results for input(s): GLUCAP in the last 168 hours. Lipid Profile: No results for input(s): CHOL, HDL, LDLCALC, TRIG, CHOLHDL, LDLDIRECT in the last 72 hours. Thyroid Function Tests:  Recent Labs  05/06/16 0311 05/07/16 0142  TSH 11.646*  --   FREET4  --  1.33*   Anemia Panel:  Recent Labs  05/07/16 0142  VITAMINB12 4769*  FOLATE 5.5*  FERRITIN 336  TIBC 164*  IRON 20*  RETICCTPCT 2.0   Sepsis Labs:  Recent Labs Lab 05/06/16 0119 05/06/16 1930 05/06/16 2240 05/07/16 0155  LATICACIDVEN 2.57* 2.7* 2.0 2.0    Recent Results (from the past 240 hour(s))  MRSA PCR Screening     Status: None   Collection Time: 05/06/16  1:49 AM  Result Value Ref Range Status   MRSA by PCR NEGATIVE NEGATIVE Final    Comment:        The GeneXpert MRSA Assay (FDA approved for NASAL specimens only), is one component of a comprehensive MRSA colonization surveillance program. It is not intended to diagnose MRSA infection nor to guide or monitor treatment for MRSA infections.          Radiology Studies: Dg Chest 2 View  05/05/2016  CLINICAL DATA:  Increasing shortness of Breath EXAM: CHEST  2 VIEW COMPARISON:  01/24/2013 FINDINGS: Cardiac shadow is stable. Defibrillator is again noted and stable. Lungs are well aerated bilaterally. Diffuse interstitial changes are seen. Additionally increased density is noted in the bases bilaterally consistent with some acute on chronic infiltrate. No bony abnormality is seen. Postsurgical changes are noted. IMPRESSION: Acute on chronic bibasilar infiltrate. Electronically Signed   By: Alcide Clever M.D.   On: 05/05/2016 21:03        Scheduled Meds: . ALPRAZolam  0.5 mg Oral BID  . amiodarone  200 mg Oral Daily  .  antiseptic oral rinse  7 mL Mouth Rinse BID  . calcitRIOL  0.25 mcg Oral Daily  . carvedilol  12.5 mg Oral BID  . cefTRIAXone (ROCEPHIN)  IV  1 g Intravenous Q24H  . digoxin  0.0625 mg Oral QODAY  . folic acid  1 mg Oral Daily  . furosemide  40 mg Intravenous Q12H  . guaiFENesin  600 mg Oral BID  . ipratropium-albuterol  3 mL Nebulization Once  . levothyroxine  50 mcg Oral QAC breakfast  . multivitamin  1 tablet Oral Daily  . potassium chloride  10 mEq Oral Daily  . pravastatin  40 mg Oral q1800  . sodium chloride flush  3 mL Intravenous Q12H  . warfarin  0.5 mg Oral q1800  . Warfarin - Pharmacist Dosing Inpatient   Does not  apply q1800   Continuous Infusions:    LOS: 1 day    Time spent: 25 minutes.     Kathlen Mody, MD [facility] Pager [phone] If 7PM-7AM, please contact night-coverage [email or web address] Password [facility] 05/07/2016, 9:46 AM

## 2016-05-07 NOTE — Progress Notes (Signed)
Pharmacist Heart Failure Core Measure Documentation  Assessment: [name] [name] has an EF documented as 20-25% on 05/10/2014 by Echo.    Rationale: Heart failure patients with left ventricular systolic dysfunction (LVSD) and an EF < 40% should be prescribed an angiotensin converting enzyme inhibitor (ACEI) or angiotensin receptor blocker (ARB) at discharge unless a contraindication is documented in the medical record.  This patient is not currently on an ACEI or ARB for HF.  This note is being placed in the record in order to provide documentation that a contraindication to the use of these agents is present for this encounter.  ACE Inhibitor or Angiotensin Receptor Blocker is contraindicated (specify all that apply)  []   ACEI allergy AND ARB allergy []   Angioedema []   Moderate or severe aortic stenosis []   Hyperkalemia []   Hypotension []   Renal artery stenosis [x]   Worsening renal function, preexisting renal disease or dysfunction   [name] [name] 05/07/2016 2:48 PM

## 2016-05-07 NOTE — Progress Notes (Signed)
ANTICOAGULATION CONSULT NOTE - Initial Consult  Pharmacy Consult for Coumadin Indication: atrial fibrillation  Allergies  Allergen Reactions  . Nitroglycerin Other (See Comments)    Became severely hypotensive  . Other     Intolerance to ACE inhibitors UNKNOWN REACTION    Patient Measurements: Height: 5\' 5"  (165.1 cm) Weight: 135 lb 3.2 oz (61.326 kg) IBW/kg (Calculated) : 61.5  Vital Signs: Temp: 97.9 F (36.6 C) (05/29 0515) Temp Source: Oral (05/29 0515) BP: 91/58 mmHg (05/29 0846) Pulse Rate: 61 (05/29 0846)  Labs:  Recent Labs  05/05/16 2040 05/05/16 2241 05/06/16 0311 05/07/16 0142  HGB 10.9*  --  10.8* 10.1*  HCT 35.4*  --  34.3* 32.2*  PLT 158  --  163 151  LABPROT  --  24.9* 25.4* 27.6*  INR  --  2.28* 2.34* 2.61*  CREATININE 2.61*  --   --  2.34*    Estimated Creatinine Clearance: 22.9 mL/min (by C-G formula based on Cr of 2.34).   Medical History: Past Medical History  Diagnosis Date  . Coronary artery disease   . Cardiomyopathy, ischemic     EF of 30-35%  . Status post CVA   . VT (ventricular tachycardia) (HCC) aug of 2010    removed old ICD and replaced with biventricular ICD  . CKD (chronic kidney disease) stage 3, GFR 30-59 ml/min   . Atrial fibrillation (HCC)     chronic  . Hypertension   . Dyslipidemia   . Peripheral vascular disease (HCC)   . Thyroid disease     hypo,due to amiodarone therapy,resolved  . Myocardial infarction [facility]) January 2001     History of anteroseptal myocardial infarction presenting with left bundle-branch block  . Anxiety   . Tobacco abuse     History of 2 pack per day tobacco habituation  . Pacemaker   . CHF (congestive heart failure) (HCC)     Medications:  Allopurinol  Xanax  Amiodarone  Calcitriol  Coreg  Digoxin  Lasix  Synthroid  Mevacor  KCl  MVI   Coumadin 0.5 mg daily  Assessment: 75 y.o. male admitted with CHF exacerbation, h/o Afib, to continue home dose Coumadin.  Goal of Therapy:  INR  2-3 Monitor platelets by anticoagulation protocol: Yes   Plan:  Warfarin 0.5 mg daily F/U daily INR  Greggory Stallion, PharmD Clinical Pharmacy Resident Pager # [phone] 05/07/2016 9:24 AM

## 2016-05-08 ENCOUNTER — Inpatient Hospital Stay (HOSPITAL_COMMUNITY): Payer: Medicare Other

## 2016-05-08 DIAGNOSIS — I509 Heart failure, unspecified: Secondary | ICD-10-CM

## 2016-05-08 DIAGNOSIS — I5021 Acute systolic (congestive) heart failure: Secondary | ICD-10-CM

## 2016-05-08 DIAGNOSIS — I5023 Acute on chronic systolic (congestive) heart failure: Secondary | ICD-10-CM

## 2016-05-08 DIAGNOSIS — I480 Paroxysmal atrial fibrillation: Secondary | ICD-10-CM

## 2016-05-08 LAB — BASIC METABOLIC PANEL WITH GFR
Anion gap: 10 (ref 5–15)
BUN: 45 mg/dL — ABNORMAL HIGH (ref 6–20)
CO2: 21 mmol/L — ABNORMAL LOW (ref 22–32)
Calcium: 8.5 mg/dL — ABNORMAL LOW (ref 8.9–10.3)
Chloride: 103 mmol/L (ref 101–111)
Creatinine, Ser: 1.89 mg/dL — ABNORMAL HIGH (ref 0.61–1.24)
GFR calc Af Amer: 38 mL/min — ABNORMAL LOW (ref >60)
GFR calc non Af Amer: 33 mL/min — ABNORMAL LOW (ref >60)
Glucose, Bld: 93 mg/dL (ref 65–99)
Potassium: 4 mmol/L (ref 3.5–5.1)
Sodium: 134 mmol/L — ABNORMAL LOW (ref 135–145)

## 2016-05-08 LAB — T3, FREE: T3, Free: 1.5 pg/mL — ABNORMAL LOW (ref 2.0–4.4)

## 2016-05-08 LAB — PROTIME-INR
INR: 2.66 — AB (ref 0.00–1.49)
PROTHROMBIN TIME: 27.9 s — AB (ref 11.6–15.2)

## 2016-05-08 LAB — ECHOCARDIOGRAM COMPLETE
HEIGHTINCHES: 65 in
WEIGHTICAEL: 2057.6 [oz_av]

## 2016-05-08 NOTE — Progress Notes (Signed)
ANTICOAGULATION CONSULT NOTE - Follow Up Consult  Pharmacy Consult for Coumadin Indication: atrial fibrillation  Allergies  Allergen Reactions  . Nitroglycerin Other (See Comments)    Became severely hypotensive  . Other     Intolerance to ACE inhibitors UNKNOWN REACTION    Patient Measurements: Height: 5\' 5"  (165.1 cm) Weight: 128 lb 9.6 oz (58.333 kg) IBW/kg (Calculated) : 61.5  Vital Signs: Temp: 97.5 F (36.4 C) (05/30 1255) Temp Source: Oral (05/30 1255) BP: 86/56 mmHg (05/30 1255) Pulse Rate: 59 (05/30 1255)  Labs:  Recent Labs  05/05/16 2040  05/06/16 0311 05/07/16 0142 05/08/16 0507  HGB 10.9*  --  10.8* 10.1*  --   HCT 35.4*  --  34.3* 32.2*  --   PLT 158  --  163 151  --   LABPROT  --   < > 25.4* 27.6* 27.9*  INR  --   < > 2.34* 2.61* 2.66*  CREATININE 2.61*  --   --  2.34* 1.89*  < > = values in this interval not displayed.  Estimated Creatinine Clearance: 27 mL/min (by C-G formula based on Cr of 1.89).   Medications:  Scheduled:  . ALPRAZolam  0.5 mg Oral BID  . amiodarone  200 mg Oral Daily  . antiseptic oral rinse  7 mL Mouth Rinse BID  . calcitRIOL  0.25 mcg Oral Daily  . carvedilol  12.5 mg Oral BID  . cefTRIAXone (ROCEPHIN)  IV  1 g Intravenous Q24H  . digoxin  0.0625 mg Oral QODAY  . folic acid  1 mg Oral Daily  . furosemide  40 mg Intravenous Q12H  . guaiFENesin  600 mg Oral BID  . ipratropium-albuterol  3 mL Nebulization Once  . levothyroxine  50 mcg Oral QAC breakfast  . multivitamin  1 tablet Oral Daily  . potassium chloride  10 mEq Oral Daily  . pravastatin  40 mg Oral q1800  . sodium chloride flush  3 mL Intravenous Q12H  . warfarin  0.5 mg Oral q1800  . Warfarin - Pharmacist Dosing Inpatient   Does not apply q1800    Assessment: 75 yo M admitted with CAP and HF exacerbation.  Pt on Coumadin PTA for hx of afib.  INR is therapeutic on home regimen. Will continue.  Goal of Therapy:  INR 2-3 Monitor platelets by  anticoagulation protocol: Yes   Plan:  Coumadin 0.5mg  PO daily Continue daily INR for now.  [employer], Pharm.D., BCPS Clinical Pharmacist Pager [phone] 05/08/2016 1:03 PM

## 2016-05-08 NOTE — Progress Notes (Signed)
Pt 02 now at 2L sats 94%. Will continue to monitor

## 2016-05-08 NOTE — Progress Notes (Signed)
PROGRESS NOTE    [name] [name]  WUJ:[id number] DOB: 22-Mar-1941 DOA: 05/05/2016 PCP: Pamelia Hoit, MD    Brief Narrative: [name] [name] is a 75 y.o. male with medical history significant of systolic CHF last EF 20-25% in 05/2014, ischemic cardiomyopathy, s/p PMCAD, CKD stage III; who presents with worsening shortness of breath and cough over the last 2-3 days. He was found to be in acute respiratory failure with hypoxia requiring upto 15 lit [place] high flow oxygen, secondary to streptococcal pneumonia and acute on chronic systolic heart failure.  He was started on IV antibiotics and cardiology consulted for recommendations.  A repeat echocardiogram has been ordered and pending.    Assessment & Plan:   Principal Problem:   Acute congestive heart failure (HCC) Active Problems:   Essential hypertension   Atrial fibrillation (HCC)   Hypothyroidism (acquired)   CAP (community acquired pneumonia)   Acute respiratory failure with hypoxia; secondary to streptococcal pneumonia, and acute on chronic systolic heart failure.  On high flow oxygen , keep oxygen sats greater than 90%. We have weaned him down to 4 lit of [place] oxygen from 15 liters.  HIV screen negative.  Resume iV rocephin. Urine for strept antigen positive.  Echocardiogram ordered and pending.  Started on IV lasix, strict intake and output. Has diuresed about 2.2 lit of fluid since admission. Daily weights.    Atrial fibrillation: Rate controlled on coumadin.  INR therapeutic.  Amiodarone    Hypothyroidsim: Resume synthroid.  TSH elevated, free t4 is elevated, pending free t3.    Anemia: Normocytic.  Get anemia panel shows low folate levels.  Start folate supplements.    Hypertension: Pt baseline bp runs around 80/50's.     Sepsis from community acquired pneumonia; Elevated lactic acid, tachypnea, hypotension, strept pneumonia. On admission.  Repeat lactic acid has improved.  Resume IV antibiotics.    Cultures are pending.  .      DVT prophylaxis: (Lovenox) Code Status: (Full Family Communication: family at bedside.  Disposition Plan: pending further management. Pending PT eval.   Consultants:   Cardiology.   Procedures: repeat ECHO  Antimicrobials: rocephin  Zithromax.   Subjective: Reports sob improved.   Objective: Filed Vitals:   05/07/16 1920 05/07/16 1939 05/07/16 2109 05/08/16 0614  BP:   104/58 92/48  Pulse:   59 59  Temp:   97.7 F (36.5 C) 98.1 F (36.7 C)  TempSrc:   Oral Oral  Resp:   16 18  Height:      Weight:    58.333 kg (128 lb 9.6 oz)  SpO2: 90% 91% 96% 96%    Intake/Output Summary (Last 24 hours) at 05/08/16 1151 Last data filed at 05/08/16 1042  Gross per 24 hour  Intake    650 ml  Output   1640 ml  Net   -990 ml   Filed Weights   05/06/16 0115 05/07/16 0515 05/08/16 0614  Weight: 61.5 kg (135 lb 9.3 oz) 61.326 kg (135 lb 3.2 oz) 58.333 kg (128 lb 9.6 oz)    Examination:  General exam: Anxious.  Respiratory system: basilar crackles, increased work of breathing, scattered wheezing.  Cardiovascular system: S1 & S2 heard, RRR. No JVD, murmurs, rubs, gallops or clicks. No pedal edema. Gastrointestinal system: Abdomen is nondistended, soft and nontender. No organomegaly or masses felt. Normal bowel sounds heard. Central nervous system: Alert and oriented. No focal neurological deficits. Extremities: Symmetric 5 x 5 power. Skin: No rashes, lesions or ulcers  Data Reviewed: I have personally reviewed following labs and imaging studies  CBC:  Recent Labs Lab 05/05/16 2040 05/06/16 0311 05/07/16 0142  WBC 13.7* 13.8* 7.8  NEUTROABS  --  10.8*  --   HGB 10.9* 10.8* 10.1*  HCT 35.4* 34.3* 32.2*  MCV 91.5 91.7 90.4  PLT 158 163 151   Basic Metabolic Panel:  Recent Labs Lab 05/05/16 2040 05/07/16 0142 05/08/16 0507  NA 132* 134* 134*  K 4.4 4.0 4.0  CL 105 103 103  CO2 19* 22 21*  GLUCOSE 112* 95 93  BUN 44* 47*  45*  CREATININE 2.61* 2.34* 1.89*  CALCIUM 8.4* 8.6* 8.5*   GFR: Estimated Creatinine Clearance: 27 mL/min (by C-G formula based on Cr of 1.89). Liver Function Tests:  Recent Labs Lab 05/06/16 1445  AST 29  ALT 14*  ALKPHOS 129*  BILITOT 1.0  PROT 6.2*  ALBUMIN 2.2*   No results for input(s): LIPASE, AMYLASE in the last 168 hours. No results for input(s): AMMONIA in the last 168 hours. Coagulation Profile:  Recent Labs Lab 05/05/16 2241 05/06/16 0311 05/07/16 0142 05/08/16 0507  INR 2.28* 2.34* 2.61* 2.66*   Cardiac Enzymes: No results for input(s): CKTOTAL, CKMB, CKMBINDEX, TROPONINI in the last 168 hours. BNP (last 3 results) No results for input(s): PROBNP in the last 8760 hours. HbA1C: No results for input(s): HGBA1C in the last 72 hours. CBG: No results for input(s): GLUCAP in the last 168 hours. Lipid Profile: No results for input(s): CHOL, HDL, LDLCALC, TRIG, CHOLHDL, LDLDIRECT in the last 72 hours. Thyroid Function Tests:  Recent Labs  05/06/16 0311 05/07/16 0142  TSH 11.646*  --   FREET4  --  1.33*  T3FREE  --  1.5*   Anemia Panel:  Recent Labs  05/07/16 0142  VITAMINB12 4769*  FOLATE 5.5*  FERRITIN 336  TIBC 164*  IRON 20*  RETICCTPCT 2.0   Sepsis Labs:  Recent Labs Lab 05/06/16 0119 05/06/16 1930 05/06/16 2240 05/07/16 0155  LATICACIDVEN 2.57* 2.7* 2.0 2.0    Recent Results (from the past 240 hour(s))  Blood culture (routine x 2)     Status: None (Preliminary result)   Collection Time: 05/05/16 10:36 PM  Result Value Ref Range Status   Specimen Description BLOOD RIGHT ANTECUBITAL  Final   Special Requests IN PEDIATRIC BOTTLE 4CC  Final   Culture NO GROWTH 2 DAYS  Final   Report Status PENDING  Incomplete  Blood culture (routine x 2)     Status: None (Preliminary result)   Collection Time: 05/05/16 10:41 PM  Result Value Ref Range Status   Specimen Description BLOOD RIGHT HAND  Final   Special Requests BOTTLES DRAWN  AEROBIC AND ANAEROBIC 5CC   Final   Culture NO GROWTH 2 DAYS  Final   Report Status PENDING  Incomplete  MRSA PCR Screening     Status: None   Collection Time: 05/06/16  1:49 AM  Result Value Ref Range Status   MRSA by PCR NEGATIVE NEGATIVE Final    Comment:        The GeneXpert MRSA Assay (FDA approved for NASAL specimens only), is one component of a comprehensive MRSA colonization surveillance program. It is not intended to diagnose MRSA infection nor to guide or monitor treatment for MRSA infections.          Radiology Studies: Dg Chest 2 View  05/08/2016  CLINICAL DATA:  Chest congestion shortness of breath and weakness; former smoker EXAM: CHEST  2  VIEW COMPARISON:  PA and lateral chest x-ray of May 05, 2016 FINDINGS: The lungs are well-expanded. There is confluent density in the left lower lobe more conspicuous than on the previous study. Small amounts of pleural fluid are present bilaterally blunting the costophrenic angles. The interstitial markings elsewhere in both lungs remain mildly increased. The cardiac silhouette is top-normal in size. The permanent pacemaker defibrillator is in stable position. There are post CABG changes. The bony thorax exhibits no acute abnormality. IMPRESSION: Infiltrate in the left lower lobe posteriorly consistent with pneumonia. This is superimposed upon findings of COPD and chronic fibrosis. No CHF. Electronically Signed   By: David  [name] M.D.   On: 05/08/2016 09:20        Scheduled Meds: . ALPRAZolam  0.5 mg Oral BID  . amiodarone  200 mg Oral Daily  . antiseptic oral rinse  7 mL Mouth Rinse BID  . calcitRIOL  0.25 mcg Oral Daily  . carvedilol  12.5 mg Oral BID  . cefTRIAXone (ROCEPHIN)  IV  1 g Intravenous Q24H  . digoxin  0.0625 mg Oral QODAY  . folic acid  1 mg Oral Daily  . furosemide  40 mg Intravenous Q12H  . guaiFENesin  600 mg Oral BID  . ipratropium-albuterol  3 mL Nebulization Once  . levothyroxine  50 mcg Oral QAC  breakfast  . multivitamin  1 tablet Oral Daily  . potassium chloride  10 mEq Oral Daily  . pravastatin  40 mg Oral q1800  . sodium chloride flush  3 mL Intravenous Q12H  . warfarin  0.5 mg Oral q1800  . Warfarin - Pharmacist Dosing Inpatient   Does not apply q1800   Continuous Infusions:    LOS: 2 days    Time spent: 25 minutes.     Kathlen Mody, MD [facility] Pager [phone] If 7PM-7AM, please contact night-coverage [email or web address] Password [email or web address] 05/08/2016, 11:51 AM

## 2016-05-08 NOTE — Progress Notes (Signed)
Pt 02 found to be in the 80s, pt 02 around his neck.RN raised 02 to 4L. Will continue to monitor. Pt currently satting 87% on 4L, pt is eating

## 2016-05-08 NOTE — Plan of Care (Signed)
Problem: Activity: Goal: Capacity to carry out activities will improve Outcome: Progressing Patient continues to experience moderate activity intolerance with exertional dyspnea, oxygen desaturation and mild respiratory distress that quickly resolves with direct coaching and pulmonary toilet. With such activity, O2 saturation typically drops below 90% and patient uses accessory muscles with increased inspirational effort.  RN on previous shift was able to successfully titrate O2 delivery to 3 L/m, significantly less than oxygen requirements on admission. At this point, patient is within striking distance of his home O2 settings (2 L/m) and holding O2 saturations above 92% at rest.  On initial assessment, patient was experiencing expiratory wheezes in both upper lobes, coarse rhonchi in the lower right lobe, and clear, but diminished lung sounds in the left base. After PRN nebulizer treatment per orders, wheezes and rhonchi were relieved. However, fine crackles were auscultated in both lung bases; likely from atelectasis. Patient stated he was "feeling better" after nebulizer treatment.  Have instructed patient and family on turn, cough and deep breathing exercises.  Patient and family had questions regarding oxygen humidification. Oxygen humidifier system was set-up in the room at their request.  At this time, patient is resting comfortably in bed with no signs of distress.  Vital signs obtained this shift have been WDL for patient: Blood pressure 104/58, pulse 59, temperature 97.7 F (36.5 C), temperature source Oral, resp. rate 16, height 5\' 5"  (1.651 m), weight 61.326 kg (135 lb 3.2 oz), SpO2 96 %.  Continuing to closely monitor.

## 2016-05-08 NOTE — Care Management Important Message (Signed)
Important Message  Patient Details  Name: [name] [name] MRN: [id number] Date of Birth: [birth year]   Medicare Important Message Given:  Yes    Bernadette Hoit 05/08/2016, 10:47 AM

## 2016-05-08 NOTE — Progress Notes (Signed)
Patient is not currently on HFNC. Patient has been on 2L [place] humidified O2 since 1300.

## 2016-05-08 NOTE — Progress Notes (Signed)
Pt has been tolerating 2L of 02 today. sats 92%. RN offered to walk pt and get him up to the Chair, pt declined

## 2016-05-08 NOTE — Progress Notes (Signed)
  Echocardiogram 2D Echocardiogram has been performed.  [name] [name] 05/08/2016, 2:26 PM

## 2016-05-08 NOTE — Progress Notes (Signed)
Patient Name: [name] [name] Date of Encounter: 05/08/2016  Primary cardiologist: Dr. [name]   Principal Problem:   Acute congestive heart failure [facility]) Active Problems:   Essential hypertension   Atrial fibrillation (HCC)   Hypothyroidism (acquired)   CAP (community acquired pneumonia)    SUBJECTIVE  Denies any CP. SOB improving. Currently on 4L [place], on 2L [place] at home.   CURRENT MEDS . ALPRAZolam  0.5 mg Oral BID  . amiodarone  200 mg Oral Daily  . antiseptic oral rinse  7 mL Mouth Rinse BID  . calcitRIOL  0.25 mcg Oral Daily  . carvedilol  12.5 mg Oral BID  . cefTRIAXone (ROCEPHIN)  IV  1 g Intravenous Q24H  . digoxin  0.0625 mg Oral QODAY  . folic acid  1 mg Oral Daily  . furosemide  40 mg Intravenous Q12H  . guaiFENesin  600 mg Oral BID  . ipratropium-albuterol  3 mL Nebulization Once  . levothyroxine  50 mcg Oral QAC breakfast  . multivitamin  1 tablet Oral Daily  . potassium chloride  10 mEq Oral Daily  . pravastatin  40 mg Oral q1800  . sodium chloride flush  3 mL Intravenous Q12H  . warfarin  0.5 mg Oral q1800  . Warfarin - Pharmacist Dosing Inpatient   Does not apply q1800    OBJECTIVE  Filed Vitals:   05/07/16 1920 05/07/16 1939 05/07/16 2109 05/08/16 0614  BP:   104/58 92/48  Pulse:   59 59  Temp:   97.7 F (36.5 C) 98.1 F (36.7 C)  TempSrc:   Oral Oral  Resp:   16 18  Height:      Weight:    128 lb 9.6 oz (58.333 kg)  SpO2: 90% 91% 96% 96%    Intake/Output Summary (Last 24 hours) at 05/08/16 0906 Last data filed at 05/08/16 0800  Gross per 24 hour  Intake    290 ml  Output   1440 ml  Net  -1150 ml   Filed Weights   05/06/16 0115 05/07/16 0515 05/08/16 0614  Weight: 135 lb 9.3 oz (61.5 kg) 135 lb 3.2 oz (61.326 kg) 128 lb 9.6 oz (58.333 kg)    PHYSICAL EXAM  General: Pleasant, NAD. Neuro: Alert and oriented X 3. Moves all extremities spontaneously. Psych: Normal affect. HEENT:  Normal  Neck: Supple without bruits  +JVD. Lungs:   Resp regular and unlabored. Bibasilar rale and rhonchi. Heart: RRR no s3, s4, or murmurs. Abdomen: Soft, non-tender, non-distended, BS + x 4.  Extremities: No clubbing, cyanosis or edema. DP/PT/Radials 2+ and equal bilaterally.  Accessory Clinical Findings  CBC  Recent Labs  05/06/16 0311 05/07/16 0142  WBC 13.8* 7.8  NEUTROABS 10.8*  --   HGB 10.8* 10.1*  HCT 34.3* 32.2*  MCV 91.7 90.4  PLT 163 151   Basic Metabolic Panel  Recent Labs  05/07/16 0142 05/08/16 0507  NA 134* 134*  K 4.0 4.0  CL 103 103  CO2 22 21*  GLUCOSE 95 93  BUN 47* 45*  CREATININE 2.34* 1.89*  CALCIUM 8.6* 8.5*   Liver Function Tests  Recent Labs  05/06/16 1445  AST 29  ALT 14*  ALKPHOS 129*  BILITOT 1.0  PROT 6.2*  ALBUMIN 2.2*   Thyroid Function Tests  Recent Labs  05/06/16 0311 05/07/16 0142  TSH 11.646*  --   T3FREE  --  1.5*    TELE afib with HR 60s    ECG  No  new EKG  Echocardiogram 05/10/2014  LV EF: 20% -  25%  ------------------------------------------------------------------- Indications:   427.31 Atrial Fibrillation.  ------------------------------------------------------------------- History:  PMH: Ventricular tachycardia. Acquired from the patient and from the patient&'s chart. Coronary artery disease. Congestive heart failure. Risk factors: Current tobacco use.  ------------------------------------------------------------------- Study Conclusions  - Left ventricle: Multiple RWMA;s including septum, apex anterior wall and mid and distal inferior wall. The cavity size was severely dilated. Wall thickness was normal. Systolic function was severely reduced. The estimated ejection fraction was in the range of 20% to 25%. - Left atrium: The atrium was mildly dilated. - Atrial septum: No defect or patent foramen ovale was identified.    Radiology/Studies  Dg Chest 2 View  05/05/2016  CLINICAL DATA:  Increasing shortness of Breath  EXAM: CHEST  2 VIEW COMPARISON:  01/24/2013 FINDINGS: Cardiac shadow is stable. Defibrillator is again noted and stable. Lungs are well aerated bilaterally. Diffuse interstitial changes are seen. Additionally increased density is noted in the bases bilaterally consistent with some acute on chronic infiltrate. No bony abnormality is seen. Postsurgical changes are noted. IMPRESSION: Acute on chronic bibasilar infiltrate. Electronically Signed   By: Alcide Clever M.D.   On: 05/05/2016 21:03    ASSESSMENT AND PLAN  75 yo male with PMH of ICM with EF 20-25% by echo 05/2014, h/o VT s/p BiV ICD, CABGx3 2001 (LIMA to LAD, SVG to OM, SVG to PDA), chronic afib presented with increasing SOB  1. Acute on chronic systolic HF/ ICM with baseline EF 20-25% by echo 05/2014  - s/p IV diuresis, weight down from 135 down to 128 lbs. I/O -2.2 L.   - still has JVD more noticeable on the R side. Lung has both crackle and rhonchi, difficult to tell fluid status based on physical exam alone esp with CAP at the same time, likely can transition to PO lasix this afternoon or tomorrow. Note BP borderline this AM. Will continue current medication.  - pending echo  2. Strep pneumonia: per IM  3. Acute on CKD stage III  - Cr improving with diuresis  4. H/o VT s/p BiV ICD  5. 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 Chronic afib, rate controlled on coumadin   Signed, Azalee Course PA-C Pager: [phone]  Pt seen and examined  I agree with findings of H Meng above   Diuresing  Breathing is improving  Still with volume increase on exam  NEck:  INcreased JVP  Lungs with rales  Cardiac RRR  No S3 Ext with tr edema  Continue IV lasix today  Reassess in AM   Dietrich Pates

## 2016-05-09 ENCOUNTER — Inpatient Hospital Stay (HOSPITAL_COMMUNITY): Payer: Medicare Other

## 2016-05-09 LAB — BASIC METABOLIC PANEL
ANION GAP: 9 (ref 5–15)
BUN: 44 mg/dL — AB (ref 6–20)
CHLORIDE: 102 mmol/L (ref 101–111)
CO2: 24 mmol/L (ref 22–32)
Calcium: 8.6 mg/dL — ABNORMAL LOW (ref 8.9–10.3)
Creatinine, Ser: 2 mg/dL — ABNORMAL HIGH (ref 0.61–1.24)
GFR calc Af Amer: 35 mL/min — ABNORMAL LOW (ref >60)
GFR calc non Af Amer: 30 mL/min — ABNORMAL LOW (ref >60)
GLUCOSE: 92 mg/dL (ref 65–99)
POTASSIUM: 3.9 mmol/L (ref 3.5–5.1)
Sodium: 135 mmol/L (ref 135–145)

## 2016-05-09 LAB — PROTIME-INR
INR: 2.63 — ABNORMAL HIGH (ref 0.00–1.49)
Prothrombin Time: 27.7 seconds — ABNORMAL HIGH (ref 11.6–15.2)

## 2016-05-09 LAB — LACTIC ACID, PLASMA: Lactic Acid, Venous: 1.7 mmol/L (ref 0.5–2.0)

## 2016-05-09 MED ORDER — CARVEDILOL 3.125 MG PO TABS
3.1250 mg | ORAL_TABLET | Freq: Two times a day (BID) | ORAL | Status: DC
Start: 2016-05-09 — End: 2016-05-12
  Administered 2016-05-09 – 2016-05-12 (×6): 3.125 mg via ORAL
  Filled 2016-05-09 (×6): qty 1

## 2016-05-09 MED ORDER — FUROSEMIDE 10 MG/ML IJ SOLN
40.0000 mg | Freq: Once | INTRAMUSCULAR | Status: AC
  Administered 2016-05-09: 40 mg via INTRAVENOUS

## 2016-05-09 MED ORDER — VANCOMYCIN HCL IN DEXTROSE 1-5 GM/200ML-% IV SOLN
1000.0000 mg | Freq: Once | INTRAVENOUS | Status: AC
  Administered 2016-05-09: 1000 mg via INTRAVENOUS
  Filled 2016-05-09: qty 200

## 2016-05-09 MED ORDER — VANCOMYCIN HCL IN DEXTROSE 750-5 MG/150ML-% IV SOLN: 750.0000 mg | INTRAVENOUS | Status: DC

## 2016-05-09 MED ORDER — DEXTROSE 5 % IV SOLN
1.0000 g | Freq: Every day | INTRAVENOUS | Status: DC
  Administered 2016-05-09 – 2016-05-11 (×3): 1 g via INTRAVENOUS
  Filled 2016-05-09 (×4): qty 1

## 2016-05-09 MED ORDER — VANCOMYCIN HCL 500 MG IV SOLR
500.0000 mg | INTRAVENOUS | Status: DC
  Administered 2016-05-10: 500 mg via INTRAVENOUS
  Filled 2016-05-09: qty 500

## 2016-05-09 NOTE — Progress Notes (Addendum)
PROGRESS NOTE  [name] [name]  JXB:[id number] DOB: [birth year]  DOA: 05/05/2016 PCP: Pamelia Hoit, MD   Brief Narrative:  75 year old male with a PMH of chronic systolic CHF, last EF 20-25 percent in June 2015, ICM, VT s/p ICD & PPM, stage III chronic kidney disease, chronic A. 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 Hypoxic in 70s with EMS. Pacemaker evaluation in ED and said to be OK. Admitted for acute on chronic hypoxic respiratory failure secondary to community-acquired pneumonia and decompensated CHF.   Assessment & Plan:   Principal Problem:   CAP (community acquired pneumonia) Active Problems:   Essential hypertension   Atrial fibrillation (HCC)   Hypothyroidism (acquired)   Acute systolic congestive heart failure (HCC)   Community-acquired pneumonia, streptococcal - Empirically started on Rocephin and azithromycin. However overnight 5/30, developed worsening symptoms, repeat chest x-ray shows infiltrate in left lower lobe consistent with pneumonia which is superimposed upon findings of COPD and chronic fibrosis. - Antibiotics were broadened to IV vancomycin and cefepime. - Blood cultures 2: Negative to date. Urine streptococcal antigen +. HIV screen negative. - Speech therapy swallow evaluation to rule out aspiration.  Acute on chronic systolic CHF/ICM with EF 20-25 percent by echo 05/2014, h/o VT s/p BiV ICD, CABG - Treated with IV Lasix. -2.465 L since admission. - Repeat 2-D echo shows LVEF 40-45 percent, improved. Severely elevated PA peak pressure. - Cardiology follow-up appreciated. Clinically improved. Lasix held for today and consider starting oral Lasix in a.m.  Acute on chronic respiratory failure with hypoxia - Patient has been on home oxygen 2 L/m continuously at home but as per family,  does not keep it on all the time. - Overnight 5/30, decompensated and was on Ventimask. Improving. Titrate oxygen down as far as his saturations >88%. Incentive spirometry. - Chronic respiratory failure may be related to COPD and pulmonary fibrosis-? Related to amiodarone.  Chronic atrial fibrillation - V paced on monitor. Digoxin level therapeutic at 1.5. Anticoagulated on Coumadin. Continue amiodarone and digoxin. - Discussed with Cardiology who are reducing Carvedilol to 3.125 BID due to hypotension and will follow up regarding discontinuing amiodarone.  Hypothyroid - Continue levothyroxine - TSH 11.646, free T4: 1.33 (mildly elevated), free T3: 1.5 (low). Unclear etiology of abnormal TFTs-most likely from amiodarone. Cardiology to opine regarding possibility of coming off of amiodarone. Last TSH on 1/30:4.983.  Anxiety - When necessary Xanax.  Anemia - Stable. Started on folate for mild folate deficiency. Probably anemia of chronic disease.  Hypertension/hypotension - ? Chronic hypotension. Seems asymptomatic. Not on antihypertensives. Lasix held for today. Monitor closely.  Sepsis due to community-acquired pneumonia - Sepsis physiology present on admission. Improved. Management as above.  Chronic kidney disease, stage 3-4 - Baseline creatinine may be in the 2 range. Last creatinine prior to admission: 1.94 on 01/09/2016. Follow BMP in a.m.   DVT prophylaxis: Anticoagulated on Coumadin Code Status: Full. Family Communication: Discussed with daughter at bedside. Disposition Plan: DC home when medically stable.   Consultants:   Cardiology  Procedures:   2-D echo 05/08/16: Study Conclusions  - Left ventricle: The cavity size was normal. Systolic function was  mildly to moderately reduced. The estimated ejection fraction was  in the range of 40% to 45%. There is akinesis of the  apicalanteroseptal myocardium. - Mitral valve: Calcified annulus. There was mild  regurgitation. -  Left atrium: The atrium was mildly dilated. - Right atrium: The atrium was mildly dilated. - Pulmonary arteries: Systolic pressure was severely increased. PA  peak pressure: 74 mm Hg (S).  Impressions:  - EF is improved when compared to prior ECHO.   Antimicrobials:   Azithromycin 1 dose on 5/27  IV Rocephin 5/27 > 5/30  IV cefepime 5/31 >  IV vancomycin 5/30 >    Subjective: Overnight events noted. As per patient and daughter, noted to be hypoxic overnight but no worsening of dyspnea. States that he feels "okay" and denies dyspnea or chest pain. Cough from admission has improved. No other complaints reported.  Objective:  Filed Vitals:   05/09/16 1145 05/09/16 1148 05/09/16 1200 05/09/16 1241  BP: 86/56  99/60   Pulse: 59  59   Temp:  97.5 F (36.4 C)    TempSrc:  Tympanic    Resp:      Height:      Weight:      SpO2: 87%  90% 92%    Intake/Output Summary (Last 24 hours) at 05/09/16 1402 Last data filed at 05/09/16 1222  Gross per 24 hour  Intake   1440 ml  Output   1725 ml  Net   -285 ml   Filed Weights   05/07/16 0515 05/08/16 0614 05/09/16 0623  Weight: 61.326 kg (135 lb 3.2 oz) 58.333 kg (128 lb 9.6 oz) 58.8 kg (129 lb 10.1 oz)    Examination:  General exam: Pleasant elderly male sitting up comfortably in bed eating breakfast this morning. Respiratory system: Diminished breath sounds in the bases with scattered left basal crackles. Rest of lung fields clear to auscultation. Respiratory effort normal. Cardiovascular system: S1 & S2 heard, RRR. No JVD, murmurs, rubs, gallops or clicks. No pedal edema. Telemetry: V paced rhythm. Gastrointestinal system: Abdomen is nondistended, soft and nontender. No organomegaly or masses felt. Normal bowel sounds heard. Central nervous system: Alert and oriented. No focal neurological deficits. Extremities: Symmetric 5 x 5 power. Skin: No rashes, lesions or ulcers Psychiatry: Judgement and insight  appear normal. Mood & affect appropriate.     Data Reviewed: I have personally reviewed following labs and imaging studies  CBC:  Recent Labs Lab 05/05/16 2040 05/06/16 0311 05/07/16 0142  WBC 13.7* 13.8* 7.8  NEUTROABS  --  10.8*  --   HGB 10.9* 10.8* 10.1*  HCT 35.4* 34.3* 32.2*  MCV 91.5 91.7 90.4  PLT 158 163 151   Basic Metabolic Panel:  Recent Labs Lab 05/05/16 2040 05/07/16 0142 05/08/16 0507 05/09/16 0443  NA 132* 134* 134* 135  K 4.4 4.0 4.0 3.9  CL 105 103 103 102  CO2 19* 22 21* 24  GLUCOSE 112* 95 93 92  BUN 44* 47* 45* 44*  CREATININE 2.61* 2.34* 1.89* 2.00*  CALCIUM 8.4* 8.6* 8.5* 8.6*   GFR: Estimated Creatinine Clearance: 25.7 mL/min (by C-G formula based on Cr of 2). Liver Function Tests:  Recent Labs Lab 05/06/16 1445  AST 29  ALT 14*  ALKPHOS 129*  BILITOT 1.0  PROT 6.2*  ALBUMIN 2.2*   No results for input(s): LIPASE, AMYLASE in the last 168 hours. No results for input(s): AMMONIA in the last 168 hours. Coagulation Profile:  Recent Labs Lab 05/05/16 2241 05/06/16 0311 05/07/16 0142 05/08/16 0507 05/09/16 0443  INR 2.28* 2.34* 2.61* 2.66* 2.63*   Cardiac Enzymes: No results for input(s): CKTOTAL, CKMB, CKMBINDEX, TROPONINI in the last 168 hours. BNP (last 3 results) No results  for input(s): PROBNP in the last 8760 hours. HbA1C: No results for input(s): HGBA1C in the last 72 hours. CBG: No results for input(s): GLUCAP in the last 168 hours. Lipid Profile: No results for input(s): CHOL, HDL, LDLCALC, TRIG, CHOLHDL, LDLDIRECT in the last 72 hours. Thyroid Function Tests:  Recent Labs  05/07/16 0142  FREET4 1.33*  T3FREE 1.5*   Anemia Panel:  Recent Labs  05/07/16 0142  VITAMINB12 4769*  FOLATE 5.5*  FERRITIN 336  TIBC 164*  IRON 20*  RETICCTPCT 2.0    Sepsis Labs:  Recent Labs Lab 05/06/16 1930 05/06/16 2240 05/07/16 0155 05/09/16 0442  LATICACIDVEN 2.7* 2.0 2.0 1.7    Recent Results (from the  past 240 hour(s))  Blood culture (routine x 2)     Status: None (Preliminary result)   Collection Time: 05/05/16 10:36 PM  Result Value Ref Range Status   Specimen Description BLOOD RIGHT ANTECUBITAL  Final   Special Requests IN PEDIATRIC BOTTLE 4CC  Final   Culture NO GROWTH 3 DAYS  Final   Report Status PENDING  Incomplete  Blood culture (routine x 2)     Status: None (Preliminary result)   Collection Time: 05/05/16 10:41 PM  Result Value Ref Range Status   Specimen Description BLOOD RIGHT HAND  Final   Special Requests BOTTLES DRAWN AEROBIC AND ANAEROBIC 5CC   Final   Culture NO GROWTH 3 DAYS  Final   Report Status PENDING  Incomplete  MRSA PCR Screening     Status: None   Collection Time: 05/06/16  1:49 AM  Result Value Ref Range Status   MRSA by PCR NEGATIVE NEGATIVE Final    Comment:        The GeneXpert MRSA Assay (FDA approved for NASAL specimens only), is one component of a comprehensive MRSA colonization surveillance program. It is not intended to diagnose MRSA infection nor to guide or monitor treatment for MRSA infections.          Radiology Studies: Dg Chest 2 View  05/08/2016  CLINICAL DATA:  Chest congestion shortness of breath and weakness; former smoker EXAM: CHEST  2 VIEW COMPARISON:  PA and lateral chest x-ray of May 05, 2016 FINDINGS: The lungs are well-expanded. There is confluent density in the left lower lobe more conspicuous than on the previous study. Small amounts of pleural fluid are present bilaterally blunting the costophrenic angles. The interstitial markings elsewhere in both lungs remain mildly increased. The cardiac silhouette is top-normal in size. The permanent pacemaker defibrillator is in stable position. There are post CABG changes. The bony thorax exhibits no acute abnormality. IMPRESSION: Infiltrate in the left lower lobe posteriorly consistent with pneumonia. This is superimposed upon findings of COPD and chronic fibrosis. No CHF.  Electronically Signed   By: David  [name] M.D.   On: 05/08/2016 09:20   Dg Chest Port 1 View  05/09/2016  CLINICAL DATA:  Hypoxia. EXAM: PORTABLE CHEST 1 VIEW COMPARISON:  Frontal and lateral views 05/08/2016, 05/05/2016 FINDINGS: Multi lead left-sided pacemaker remains in place. Patient is post median sternotomy. Mediastinal contours are unchanged, heart at the upper limits normal in size. Worsening right basilar opacity with developing patchy density. Increasing patchy left basilar opacity. Chronic bronchitic changes are again seen small bilateral pleural effusions suspected. IMPRESSION: Worsening left and developing right basilar opacities concerning for worsening and pneumonia. Given distribution, aspiration is considered. Suspect small pleural effusions. Electronically Signed   By: Rubye Oaks M.D.   On: 05/09/2016 03:06  Scheduled Meds: . ALPRAZolam  0.5 mg Oral BID  . amiodarone  200 mg Oral Daily  . antiseptic oral rinse  7 mL Mouth Rinse BID  . calcitRIOL  0.25 mcg Oral Daily  . carvedilol  12.5 mg Oral BID  . ceFEPime (MAXIPIME) IV  1 g Intravenous Daily  . digoxin  0.0625 mg Oral QODAY  . folic acid  1 mg Oral Daily  . guaiFENesin  600 mg Oral BID  . ipratropium-albuterol  3 mL Nebulization Once  . levothyroxine  50 mcg Oral QAC breakfast  . multivitamin  1 tablet Oral Daily  . potassium chloride  10 mEq Oral Daily  . pravastatin  40 mg Oral q1800  . sodium chloride flush  3 mL Intravenous Q12H  . [START ON 05/10/2016] vancomycin  500 mg Intravenous Q24H  . warfarin  0.5 mg Oral q1800  . Warfarin - Pharmacist Dosing Inpatient   Does not apply q1800   Continuous Infusions:    LOS: 3 days    Time spent: 45 minutes.    [facility], MD [facility] Pager [phone]  If 7PM-7AM, please contact night-coverage [email or web address] Password [email or web address] 05/09/2016, 2:02 PM

## 2016-05-09 NOTE — Progress Notes (Signed)
Pharmacy Antibiotic Note  [name] [name] is a 75 y.o. male admitted on 05/05/2016 with CHF exacerbation, Strep UA + on Rocephin, now with worsening PNA.  Pharmacy has been consulted for Vancomycin  dosing.  Plan: Vancomycin 1 g IV now, then 750 mg IV q48h  Height: 5\' 5"  (165.1 cm) Weight: 128 lb 9.6 oz (58.333 kg) IBW/kg (Calculated) : 61.5  Temp (24hrs), Avg:98 F (36.7 C), Min:97.5 F (36.4 C), Max:99 F (37.2 C)   Recent Labs Lab 05/05/16 2040 05/05/16 2249 05/06/16 0119 05/06/16 0311 05/06/16 1930 05/06/16 2240 05/07/16 0142 05/07/16 0155 05/08/16 0507  WBC 13.7*  --   --  13.8*  --   --  7.8  --   --   CREATININE 2.61*  --   --   --   --   --  2.34*  --  1.89*  LATICACIDVEN  --  1.90 2.57*  --  2.7* 2.0  --  2.0  --     Estimated Creatinine Clearance: 27 mL/min (by C-G formula based on Cr of 1.89).    Allergies  Allergen Reactions  . Nitroglycerin Other (See Comments)    Became severely hypotensive  . Other     Intolerance to ACE inhibitors UNKNOWN REACTION    Antimicrobials this admission: Rocephin 5/28 >>  Vancomycin 5/31  >>    Microbiology results: 5/28 BCx: pending  5/28 MRSA PCR: neg    Eddie Candle 05/09/2016 4:05 AM

## 2016-05-09 NOTE — Evaluation (Signed)
Clinical/Bedside Swallow Evaluation Patient Details  Name: [name] [name] MRN: [id number] Date of Birth: 04-30-1941  Today's Date: 05/09/2016 Time: SLP Start Time (ACUTE ONLY): 1548 SLP Stop Time (ACUTE ONLY): 1602 SLP Time Calculation (min) (ACUTE ONLY): 14 min  Past Medical History:  Past Medical History  Diagnosis Date  . Coronary artery disease   . Cardiomyopathy, ischemic     EF of 30-35%  . Status post CVA   . VT (ventricular tachycardia) (HCC) aug of 2010    removed old ICD and replaced with biventricular ICD  . CKD (chronic kidney disease) stage 3, GFR 30-59 ml/min   . Atrial fibrillation (HCC)     chronic  . Hypertension   . Dyslipidemia   . Peripheral vascular disease (HCC)   . Thyroid disease     hypo,due to amiodarone therapy,resolved  . Myocardial infarction [facility]) January 2001     History of anteroseptal myocardial infarction presenting with left bundle-branch block  . Anxiety   . Tobacco abuse     History of 2 pack per day tobacco habituation  . Pacemaker   . CHF (congestive heart failure) (HCC)    Past Surgical History:  Past Surgical History  Procedure Laterality Date  . Cholecystectomy    . Femoral bypass      fem-fem bpg, right fem pop bpg  . Icd  07/15/2009    implant Dr. Ladona Ridgel  . Coronary artery bypass graft  2001    x 4  . Cardiac catheterization  2001    PTCA  . Cardiac catheterization  2005    patent grafts  . Cataracts  2006    right eye  . Implantable cardioverter defibrillator (icd) generator change N/A 11/28/2012    Procedure: ICD GENERATOR CHANGE;  Surgeon: Marinus Maw, MD;  Location: [facility] CATH LAB;  Service: Cardiovascular;  Laterality: N/A;   HPI:  75 year old male with a PMH of chronic systolic CHF, last EF 20-25 percent in June 2015, ICM, VT s/p ICD & PPM, stage III chronic kidney disease, chronic A. 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 Hypoxic in 70s with EMS. Pacemaker evaluation in ED and said to be OK. Admitted for acute on chronic hypoxic respiratory failure secondary to community-acquired pneumonia and decompensated CHF.   Assessment / Plan / Recommendation Clinical Impression  Pt does not have overt s/s of aspiration and appears to have appropriate coordination of breath/swallow, but is unable to complete the 3 ounce water test because he says he will "get a headache". He did drink several consecutive straw sips without overt coughing or changes in vocal quality. Recommend to continue current diet but will f/u for tolerance. Reviewed aspiration precautions and signs of dysphagia with pt and wife so they can monitor as well.    Aspiration Risk  Mild aspiration risk    Diet Recommendation Regular;Thin liquid   Liquid Administration via: Cup;Straw Medication Administration: Whole meds with puree Supervision: Patient able to self feed;Full supervision/cueing for compensatory strategies Compensations: Slow rate;Small sips/bites Postural Changes: Seated upright at 90 degrees;Remain upright for at least 30 minutes after po intake    Other  Recommendations Oral Care Recommendations: Oral care BID   Follow up Recommendations   (tba)    Frequency and Duration min 2x/week  1 week       Prognosis  Swallow Study   General HPI: 75 year old male with a PMH of chronic systolic CHF, last EF 20-25 percent in June 2015, ICM, VT s/p ICD & PPM, stage III chronic kidney disease, chronic A. 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 Hypoxic in 70s with EMS. Pacemaker evaluation in ED and said to be OK. Admitted for acute on chronic hypoxic respiratory failure secondary to  community-acquired pneumonia and decompensated CHF. Type of Study: Bedside Swallow Evaluation Previous Swallow Assessment: none in chart Diet Prior to this Study: Regular;Thin liquids Temperature Spikes Noted: No Respiratory Status: Nasal cannula History of Recent Intubation: No Behavior/Cognition: Alert;Cooperative;Pleasant mood Oral Cavity Assessment: Within Functional Limits Oral Care Completed by SLP: No Oral Cavity - Dentition: Adequate natural dentition Vision: Functional for self-feeding Self-Feeding Abilities: Able to feed self Patient Positioning: Upright in bed Baseline Vocal Quality: Normal    Oral/Motor/Sensory Function Overall Oral Motor/Sensory Function: Within functional limits   Ice Chips Ice chips: Not tested   Thin Liquid Thin Liquid: Impaired Presentation: Self Fed;Straw Pharyngeal  Phase Impairments: Suspected delayed Swallow    Nectar Thick Nectar Thick Liquid: Not tested   Honey Thick Honey Thick Liquid: Not tested   Puree Puree: Within functional limits Presentation: Self Fed;Spoon   Solid   GO   Solid: Within functional limits Presentation: Self Fed        Maxcine Ham, M.A. CCC-SLP [phone]  Maxcine Ham 05/09/2016,4:10 PM

## 2016-05-09 NOTE — Progress Notes (Signed)
Pharmacy Antibiotic Note  [name] [name] is a 75 y.o. male admitted on 05/05/2016 with CHF exacerbation, Strep UA + on Rocephin, now with worsening PNA based on CXR and increased O2 requirement.  To broaden abx to cover HCAP.    Plan: Vancomycin 1 g  (given at 0452) followed by 500mg  IV q24h.  Cefepime 1gm IV q24h Discontinue Rocephin. Follow-up renal function, cx data, and clinical progress. Vancomycin level at steady state.  Height: 5\' 5"  (165.1 cm) Weight: 129 lb 10.1 oz (58.8 kg) IBW/kg (Calculated) : 61.5  Temp (24hrs), Avg:97.9 F (36.6 C), Min:97.5 F (36.4 C), Max:99 F (37.2 C)   Recent Labs Lab 05/05/16 2040  05/06/16 0119 05/06/16 0311 05/06/16 1930 05/06/16 2240 05/07/16 0142 05/07/16 0155 05/08/16 0507 05/09/16 0442 05/09/16 0443  WBC 13.7*  --   --  13.8*  --   --  7.8  --   --   --   --   CREATININE 2.61*  --   --   --   --   --  2.34*  --  1.89*  --  2.00*  LATICACIDVEN  --   < > 2.57*  --  2.7* 2.0  --  2.0  --  1.7  --   < > = values in this interval not displayed.  Estimated Creatinine Clearance: 25.7 mL/min (by C-G formula based on Cr of 2).    Allergies  Allergen Reactions  . Nitroglycerin Other (See Comments)    Became severely hypotensive  . Ace Inhibitors Other (See Comments)    Listed as intolerance - Unknown Reaction per previous records.    Antimicrobials this admission: Rocephin 5/28 >> 5/30 Vancomycin 5/31  >>  Cefepime 5/31 >>  Microbiology results: 5/27 BCx: pending 5/27 MRSA PCR: neg Strep pneumo antigen positive   Maddilynn Esperanza, Pharm.D., BCPS Clinical Pharmacist Pager [phone] 05/09/2016 10:01 AM

## 2016-05-09 NOTE — Progress Notes (Signed)
ANTICOAGULATION CONSULT NOTE - Follow Up Consult  Pharmacy Consult for Coumadin Indication: atrial fibrillation  Allergies  Allergen Reactions  . Nitroglycerin Other (See Comments)    Became severely hypotensive  . Ace Inhibitors Other (See Comments)    Listed as intolerance - Unknown Reaction per previous records.    Patient Measurements: Height: 5\' 5"  (165.1 cm) Weight: 129 lb 10.1 oz (58.8 kg) IBW/kg (Calculated) : 61.5  Vital Signs: Temp: 97.7 F (36.5 C) (05/31 0737) Temp Source: Oral (05/31 0737) BP: 94/53 mmHg (05/31 0735) Pulse Rate: 59 (05/31 0735)  Labs:  Recent Labs  05/07/16 0142 05/08/16 0507 05/09/16 0443  HGB 10.1*  --   --   HCT 32.2*  --   --   PLT 151  --   --   LABPROT 27.6* 27.9* 27.7*  INR 2.61* 2.66* 2.63*  CREATININE 2.34* 1.89* 2.00*    Estimated Creatinine Clearance: 25.7 mL/min (by C-G formula based on Cr of 2).   Medications:  Scheduled:  . ALPRAZolam  0.5 mg Oral BID  . amiodarone  200 mg Oral Daily  . antiseptic oral rinse  7 mL Mouth Rinse BID  . calcitRIOL  0.25 mcg Oral Daily  . carvedilol  12.5 mg Oral BID  . cefTRIAXone (ROCEPHIN)  IV  1 g Intravenous Q24H  . digoxin  0.0625 mg Oral QODAY  . folic acid  1 mg Oral Daily  . guaiFENesin  600 mg Oral BID  . ipratropium-albuterol  3 mL Nebulization Once  . levothyroxine  50 mcg Oral QAC breakfast  . multivitamin  1 tablet Oral Daily  . potassium chloride  10 mEq Oral Daily  . pravastatin  40 mg Oral q1800  . sodium chloride flush  3 mL Intravenous Q12H  . [START ON 05/11/2016] vancomycin  750 mg Intravenous Q48H  . warfarin  0.5 mg Oral q1800  . Warfarin - Pharmacist Dosing Inpatient   Does not apply q1800    Assessment: 75 yo M admitted with CAP and HF exacerbation.  Pt on Coumadin PTA for hx of afib.  INR is therapeutic on home regimen. Will continue.  Goal of Therapy:  INR 2-3 Monitor platelets by anticoagulation protocol: Yes   Plan:  Coumadin 0.5mg  PO  daily Continue daily INR for now.  [employer], Pharm.D., BCPS Clinical Pharmacist Pager [phone] 05/09/2016 10:03 AM

## 2016-05-09 NOTE — Progress Notes (Addendum)
Follow-up: Notified of pt's slight up-trend in 02 demand w/o overt respiratory distress (RR-20/22). RN reported bibasilar crackles and coarse rhonchi. Some slight improvement after neb and pulmonary toilet but continues to require increased 02 delivery. PCXR revealed a worsening (L) and developing (R) basilar opacities suggestive of worsening PNA > likely bil pleural effusions. Pt also w/ acute on chronic systolic heart failure. Has been on Rocephin for streptococcal PNA. Discussed pt w/ my colleague on [facility], Dr Toniann Fail. Will add Vanc, lasix 40 mg IV and add lactate to am labs. Will continue to monitor closely on SDU.   Leanne Chang, NP-C [facility] Pager [phone]

## 2016-05-09 NOTE — Plan of Care (Signed)
Problem: Cardiac: Goal: Ability to achieve and maintain adequate cardiopulmonary perfusion will improve Outcome: Completed/Met Date Met:  05/09/16 Bilateral lower extremity edema has resolved and CHF was resolved on previous CXR, though left-sided consolidation suspicious for pneumonia persists; mild JVD also still present. Patient is ventricular pacing with bundle branch block on telemetry with no change in rate/rhythm over the last 24 hours.  Respiratory problems had been improving; less oxygen demand throughout shift yesterday and through the early part of current shift.  However, patient is now having difficulty holding O2 saturations > 88% at baseline home oxygen delivery rate of 2 L/m. Have given duoneb treatment and coached patient to perform turn,cough, deep breathing exercises. Also have instructed on use of incentive spirometer and set goal at 1000 ml, which has been met. Increased O2 delivery to 3 L/min via nasal canula. O2 saturations have not really improved much with these interventions; still holding between 84-88%. Patient is alert, oriented x 4, able to converse coherently and receive instruction with teachback and return demonstration. He has been using home oxygen for approximately 2 months, but does not have a home SPO2 monitor. Therefore, it is questionable whether or not he is at his baseline oxygenation status.  Complex patient with ongoing infection, pulmonary compliance and systolic heart failure issues.  Discussed plan of care and current interventions/medications with patient and family at bedside at time of most recent assessment. Also discussed case and interventions with Chaney Malling, NP for [facility]. Portable CXR ordered for follow-up; will page again if results are poor or acute changes/symptsoms are present.  Ongoing frequent respiratory care, pulmonary toilet and oximetry monitoring.  Will continue to follow acutely.

## 2016-05-09 NOTE — Care Management Note (Signed)
Case Management Note  Patient Details  Name: [name] [name] MRN: [id number] Date of Birth: 10-31-1941  Subjective/Objective:     Pt admitted for SOB. Pt is from home with wife. Pt usually wears 2 L of 02 at home. Being treated for PNA with IV antibiotic therapy.                Action/Plan: CM will continue to monitor for disposition needs.    Expected Discharge Date:                  Expected Discharge Plan:  Home w Home Health Services  In-House Referral:  NA  Discharge planning Services  CM Consult  Post Acute Care Choice:    Choice offered to:     DME Arranged:    DME Agency:     HH Arranged:    HH Agency:     Status of Service:  In process, will continue to follow  Medicare Important Message Given:  Yes Date Medicare IM Given:    Medicare IM give by:    Date Additional Medicare IM Given:    Additional Medicare Important Message give by:     If discussed at Long Length of Stay Meetings, dates discussed:    Additional Comments:  Gala Lewandowsky, RN 05/09/2016, 4:14 PM

## 2016-05-09 NOTE — Progress Notes (Signed)
Patient holding acceptable O2 saturations on 30% oxygen delivery via Venturi mask. No change in mentation; patient is alert, oriented x 4 and able to converse coherently. Filed Vitals:   05/09/16 0326 05/09/16 0400 05/09/16 0418 05/09/16 0431  BP:  93/52    Pulse: 59 59 59 59  Temp:    97.9 F (36.6 C)  TempSrc:    Oral  Resp:   20   Height:      Weight:      SpO2: 94% 95% 96% 92%    Portable chest x-ray results were earlier reviewed with NP on-call for [facility] and orders received. Furosemide 40 mg IV x 1 dose given after BP check. Vancomycin 1 gm per pharmacy order currently infusing at 200 ml/hr. Serum lactic acid pending.  Educated patient and family at bedside regarding chest x-ray findings and interventions currently in progress. No active concerns voiced at this time.  Continuing to acutely monitor.

## 2016-05-09 NOTE — Progress Notes (Signed)
Discussed with Dr. Waymon Amato, will cut back coreg down to 3.125mg  BID. MD to review if we can potentially take him off of amidoarone tomorrow (may need to increase digoxin at the same time) given abnormal thyroid and CXR.   Ramond Dial PA Pager: [phone]

## 2016-05-09 NOTE — Progress Notes (Signed)
Patient Name: [name] [name] Date of Encounter: 05/09/2016  Primary cardiologist: Dr. [name]   Principal Problem:   Acute systolic congestive heart failure [facility]) Active Problems:   Essential hypertension   Atrial fibrillation (HCC)   Hypothyroidism (acquired)   CAP (community acquired pneumonia)    SUBJECTIVE  Denies any CP. Had worsening SOB last night, worsening sign of PNA on CXR, given 1 dose 40mg  IV lasix and 1 dose Vanc, placed on venturi mask. SOB improving. Currently on venturi mask, on 2L  at home. After breathing treatment, he is feeling much better.   CURRENT MEDS . ALPRAZolam  0.5 mg Oral BID  . amiodarone  200 mg Oral Daily  . antiseptic oral rinse  7 mL Mouth Rinse BID  . calcitRIOL  0.25 mcg Oral Daily  . carvedilol  12.5 mg Oral BID  . cefTRIAXone (ROCEPHIN)  IV  1 g Intravenous Q24H  . digoxin  0.0625 mg Oral QODAY  . folic acid  1 mg Oral Daily  . guaiFENesin  600 mg Oral BID  . ipratropium-albuterol  3 mL Nebulization Once  . levothyroxine  50 mcg Oral QAC breakfast  . multivitamin  1 tablet Oral Daily  . potassium chloride  10 mEq Oral Daily  . pravastatin  40 mg Oral q1800  . sodium chloride flush  3 mL Intravenous Q12H  . [START ON 05/11/2016] vancomycin  750 mg Intravenous Q48H  . warfarin  0.5 mg Oral q1800  . Warfarin - Pharmacist Dosing Inpatient   Does not apply q1800    OBJECTIVE  Filed Vitals:   05/09/16 0431 05/09/16 0623 05/09/16 0735 05/09/16 0737  BP:   94/53   Pulse: 59 61 59   Temp: 97.9 F (36.6 C)   97.7 F (36.5 C)  TempSrc: Oral   Oral  Resp:  26    Height:      Weight:  129 lb 10.1 oz (58.8 kg)    SpO2: 92% 90% 92%     Intake/Output Summary (Last 24 hours) at 05/09/16 0802 Last data filed at 05/09/16 0737  Gross per 24 hour  Intake   1560 ml  Output   1825 ml  Net   -265 ml   Filed Weights   05/07/16 0515 05/08/16 0614 05/09/16 0623  Weight: 135 lb 3.2 oz (61.326 kg) 128 lb 9.6 oz (58.333 kg) 129 lb 10.1 oz  (58.8 kg)    PHYSICAL EXAM  General: Pleasant, NAD. Neuro: Alert and oriented X 3. Moves all extremities spontaneously. Psych: Normal affect. HEENT:  Normal  Neck: Supple without bruits  +JVD. Lungs:  Resp regular and unlabored. L basilar rhonchi, otherwise CTA Heart: RRR no s3, s4, or murmurs. Abdomen: Soft, non-tender, non-distended, BS + x 4.  Extremities: No clubbing, cyanosis or edema. DP/PT/Radials 2+ and equal bilaterally.  Accessory Clinical Findings  CBC  Recent Labs  05/07/16 0142  WBC 7.8  HGB 10.1*  HCT 32.2*  MCV 90.4  PLT 151   Basic Metabolic Panel  Recent Labs  05/08/16 0507 05/09/16 0443  NA 134* 135  K 4.0 3.9  CL 103 102  CO2 21* 24  GLUCOSE 93 92  BUN 45* 44*  CREATININE 1.89* 2.00*  CALCIUM 8.5* 8.6*   Liver Function Tests  Recent Labs  05/06/16 1445  AST 29  ALT 14*  ALKPHOS 129*  BILITOT 1.0  PROT 6.2*  ALBUMIN 2.2*   Thyroid Function Tests  Recent Labs  05/07/16 0142  T3FREE 1.5*  TELE afib with HR 60s, paced    ECG  No new EKG  Echocardiogram 05/08/2014  LV EF: 40% - 45%  ------------------------------------------------------------------- Indications: CHF - 428.0.  ------------------------------------------------------------------- History: PMH: Pacemaker. Defibrillator. Ishemic cardiomyopathy. Chronic kidney disease. Atrial fibrillation. Coronary artery disease. Stroke. PMH: Myocardial infarction. Risk factors: Current tobacco use. Hypertension. Dyslipidemia.  ------------------------------------------------------------------- Study Conclusions  - Left ventricle: The cavity size was normal. Systolic function was  mildly to moderately reduced. The estimated ejection fraction was  in the range of 40% to 45%. There is akinesis of the  apicalanteroseptal myocardium. - Mitral valve: Calcified annulus. There was mild regurgitation. - Left atrium: The atrium was mildly dilated. -  Right atrium: The atrium was mildly dilated. - Pulmonary arteries: Systolic pressure was severely increased. PA  peak pressure: 74 mm Hg (S).  Impressions:  - EF is improved when compared to prior ECHO.    Radiology/Studies  Dg Chest 2 View  05/08/2016  CLINICAL DATA:  Chest congestion shortness of breath and weakness; former smoker EXAM: CHEST  2 VIEW COMPARISON:  PA and lateral chest x-ray of May 05, 2016 FINDINGS: The lungs are well-expanded. There is confluent density in the left lower lobe more conspicuous than on the previous study. Small amounts of pleural fluid are present bilaterally blunting the costophrenic angles. The interstitial markings elsewhere in both lungs remain mildly increased. The cardiac silhouette is top-normal in size. The permanent pacemaker defibrillator is in stable position. There are post CABG changes. The bony thorax exhibits no acute abnormality. IMPRESSION: Infiltrate in the left lower lobe posteriorly consistent with pneumonia. This is superimposed upon findings of COPD and chronic fibrosis. No CHF. Electronically Signed   By: David  [name] M.D.   On: 05/08/2016 09:20   Dg Chest 2 View  05/05/2016  CLINICAL DATA:  Increasing shortness of Breath EXAM: CHEST  2 VIEW COMPARISON:  01/24/2013 FINDINGS: Cardiac shadow is stable. Defibrillator is again noted and stable. Lungs are well aerated bilaterally. Diffuse interstitial changes are seen. Additionally increased density is noted in the bases bilaterally consistent with some acute on chronic infiltrate. No bony abnormality is seen. Postsurgical changes are noted. IMPRESSION: Acute on chronic bibasilar infiltrate. Electronically Signed   By: Alcide Clever M.D.   On: 05/05/2016 21:03   Dg Chest Port 1 View  05/09/2016  CLINICAL DATA:  Hypoxia. EXAM: PORTABLE CHEST 1 VIEW COMPARISON:  Frontal and lateral views 05/08/2016, 05/05/2016 FINDINGS: Multi lead left-sided pacemaker remains in place. Patient is post median  sternotomy. Mediastinal contours are unchanged, heart at the upper limits normal in size. Worsening right basilar opacity with developing patchy density. Increasing patchy left basilar opacity. Chronic bronchitic changes are again seen small bilateral pleural effusions suspected. IMPRESSION: Worsening left and developing right basilar opacities concerning for worsening and pneumonia. Given distribution, aspiration is considered. Suspect small pleural effusions. Electronically Signed   By: Rubye Oaks M.D.   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 Acute on chronic systolic HF/ ICM with baseline EF 20-25% by echo 05/2014  - s/p IV diuresis, weight down from 135 down to 129 lbs. I/O -2.5 L.   - echo shows severely elevated PA peak pressure , EF 40-45%. Compare to previous echo in 2015, EF has improved from 20-25%  -  Despite persistent JVD, i am not convinced he is still fluid overloaded, since he did receive 1 dose of IV lasix this morning, I will hold further IV lasix, consider add back PO lasix  BID tomorrow AM. Discussed with IM, patient has significant improved overnight, will wean off venturi mask, plan for speech eval to assess for potential aspiration. The elevated PA peak pressure of may indicate pulmonary issue rather than HF, hopefully will improve once pulmonary issue resolve  2. Strep pneumonia: per IM, CXR on 5/30 shows persistent LLL PNA, repeat CXR on 5/31 showed worsening L and developing R basilar opacities concerning for worsening PNA. O2 sat dropped overnight. Still hypotensive with SBP 90s. Hospitalist night coverage given IV lasix at 4AM. Cr went up to 2.0. Still on IV rocephin, IV Vanc  given once today  3. Acute on CKD stage III  - Cr improving with diuresis, reached 1.89 n  5/30, further diuresis increased it to 2.0  4. H/o VT s/p BiV ICD  5. 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 Chronic afib, rate controlled on coumadin   Ramond Dial PA-C Pager: [phone]  Patient seen and examined  Agree with findings o H MEng ON exam LVP is normal  LUngs rales at L base  Cardiac exam RRR  NO S3  Ext without edema I agree with Lisabeth Devoid  No signif volume increase  Would switch back to po lasix tomorrow AM  2.  Pulm  Note change in ABX  Swallow study pending  3  Follow renal function  4.  Afib  Continue coumadin     Dietrich Pates

## 2016-05-09 NOTE — Progress Notes (Signed)
Pt now on HFNC sats 93%, resting well

## 2016-05-10 DIAGNOSIS — I481 Persistent atrial fibrillation: Secondary | ICD-10-CM

## 2016-05-10 LAB — BASIC METABOLIC PANEL
Anion gap: 7 (ref 5–15)
BUN: 44 mg/dL — AB (ref 6–20)
CALCIUM: 8.8 mg/dL — AB (ref 8.9–10.3)
CHLORIDE: 103 mmol/L (ref 101–111)
CO2: 25 mmol/L (ref 22–32)
CREATININE: 1.69 mg/dL — AB (ref 0.61–1.24)
GFR calc Af Amer: 43 mL/min — ABNORMAL LOW (ref >60)
GFR calc non Af Amer: 37 mL/min — ABNORMAL LOW (ref >60)
GLUCOSE: 91 mg/dL (ref 65–99)
Potassium: 4.4 mmol/L (ref 3.5–5.1)
Sodium: 135 mmol/L (ref 135–145)

## 2016-05-10 LAB — PROTIME-INR
INR: 2.73 — ABNORMAL HIGH (ref 0.00–1.49)
PROTHROMBIN TIME: 28.5 s — AB (ref 11.6–15.2)

## 2016-05-10 MED ORDER — GUAIFENESIN-DM 100-10 MG/5ML PO SYRP
10.0000 mL | ORAL_SOLUTION | ORAL | Status: DC | PRN
  Filled 2016-05-10: qty 10

## 2016-05-10 MED ORDER — GUAIFENESIN ER 600 MG PO TB12
600.0000 mg | ORAL_TABLET | Freq: Two times a day (BID) | ORAL | Status: DC
  Administered 2016-05-10 – 2016-05-12 (×5): 600 mg via ORAL
  Filled 2016-05-10 (×5): qty 1

## 2016-05-10 MED ORDER — GUAIFENESIN-DM 100-10 MG/5ML PO SYRP
5.0000 mL | ORAL_SOLUTION | ORAL | Status: DC | PRN
  Administered 2016-05-10 – 2016-05-11 (×4): 5 mL via ORAL
  Filled 2016-05-10 (×3): qty 5

## 2016-05-10 MED ORDER — FUROSEMIDE 40 MG PO TABS
40.0000 mg | ORAL_TABLET | Freq: Two times a day (BID) | ORAL | Status: DC
  Administered 2016-05-10 – 2016-05-12 (×4): 40 mg via ORAL
  Filled 2016-05-10 (×4): qty 1

## 2016-05-10 NOTE — Progress Notes (Signed)
ANTICOAGULATION CONSULT NOTE - Follow Up Consult  Pharmacy Consult for Coumadin Indication: atrial fibrillation  Allergies  Allergen Reactions  . Nitroglycerin Other (See Comments)    Became severely hypotensive  . Ace Inhibitors Other (See Comments)    Listed as intolerance - Unknown Reaction per previous records.    Patient Measurements: Height: 5\' 5"  (165.1 cm) Weight: 128 lb (58.06 kg) IBW/kg (Calculated) : 61.5  Vital Signs: Temp: 98 F (36.7 C) (06/01 0817) Temp Source: Oral (06/01 0817) BP: 108/57 mmHg (06/01 0445) Pulse Rate: 60 (06/01 0816)  Labs:  Recent Labs  05/08/16 0507 05/09/16 0443 05/10/16 0400  LABPROT 27.9* 27.7* 28.5*  INR 2.66* 2.63* 2.73*  CREATININE 1.89* 2.00* 1.69*    Estimated Creatinine Clearance: 30.1 mL/min (by C-G formula based on Cr of 1.69).   Medications:  Scheduled:  . ALPRAZolam  0.5 mg Oral BID  . amiodarone  200 mg Oral Daily  . antiseptic oral rinse  7 mL Mouth Rinse BID  . calcitRIOL  0.25 mcg Oral Daily  . carvedilol  3.125 mg Oral BID  . ceFEPime (MAXIPIME) IV  1 g Intravenous Daily  . digoxin  0.0625 mg Oral QODAY  . folic acid  1 mg Oral Daily  . guaiFENesin  600 mg Oral BID  . ipratropium-albuterol  3 mL Nebulization Once  . levothyroxine  50 mcg Oral QAC breakfast  . multivitamin  1 tablet Oral Daily  . potassium chloride  10 mEq Oral Daily  . pravastatin  40 mg Oral q1800  . sodium chloride flush  3 mL Intravenous Q12H  . warfarin  0.5 mg Oral q1800  . Warfarin - Pharmacist Dosing Inpatient   Does not apply q1800    Assessment: 75 yo M admitted with CAP and HF exacerbation.  Pt on Coumadin PTA for hx of afib.  INR is therapeutic on home regimen. Will continue.  Goal of Therapy:  INR 2-3 Monitor platelets by anticoagulation protocol: Yes   Plan:  Coumadin 0.5mg  PO daily Continue daily INR for now.  [employer], Pharm.D., BCPS Clinical Pharmacist Pager [phone] 05/10/2016 11:11 AM

## 2016-05-10 NOTE — Progress Notes (Signed)
Speech Language Pathology Treatment: Dysphagia  Patient Details Name: [name] [name] MRN: [id number] DOB: 04/20/1941 Today's Date: 05/10/2016 Time: 1457-1510 SLP Time Calculation (min) (ACUTE ONLY): 13 min  Assessment / Plan / Recommendation Clinical Impression  Pt politely declined solids, stating that he was too full from lunch, but was agreeable to thin liquid trials. Baseline cough noted before PO intake, which continued after intake as well. No coughing immediately followed his swallows despite large, consecutive straw sips. He appears to coordinate his respirations well and vocal quality remains clear. Given the above, as well as the fact that it is his first episode of PNA, recommend to continue current diet and precautions. Will continue to monitor as he remains in house.   HPI HPI: 75 year old male with a PMH of chronic systolic CHF, last EF 20-25 percent in June 2015, ICM, VT s/p ICD & PPM, stage III chronic kidney disease, chronic A. 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 Hypoxic in 70s with EMS. Pacemaker evaluation in ED and said to be OK. Admitted for acute on chronic hypoxic respiratory failure secondary to community-acquired pneumonia and decompensated CHF.      SLP Plan  Continue with current plan of care     Recommendations  Diet recommendations: Regular;Thin liquid Liquids provided via: Cup;Straw Medication Administration: Whole meds with puree Supervision: Patient able to self feed;Full supervision/cueing for compensatory strategies Compensations: Slow rate;Small sips/bites Postural Changes and/or Swallow Maneuvers: Seated upright 90 degrees;Upright 30-60 min after meal             Oral Care Recommendations: Oral care BID Follow up Recommendations:  (tba) Plan: Continue with current plan of  care     GO               Maxcine Ham, M.A. CCC-SLP [phone]  Maxcine Ham 05/10/2016, 3:22 PM

## 2016-05-10 NOTE — Progress Notes (Signed)
Chaplain visited patient while rounding. Patient was asleep, but did visit with his wife. Wife asked for prayer. Chaplain prayed and will follow up as needed.

## 2016-05-10 NOTE — Progress Notes (Signed)
PROGRESS NOTE  [name] [name]  WUX:[id number] DOB: 01/17/1941  DOA: 05/05/2016 PCP: Pamelia Hoit, MD   Brief Narrative:  75 year old male with a PMH of chronic systolic CHF, last EF 20-25 percent in June 2015, ICM, VT s/p ICD & PPM, stage III chronic kidney disease, chronic A. 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 Hypoxic in 70s with EMS. Pacemaker evaluation in ED and said to be OK. Admitted for acute on chronic hypoxic respiratory failure secondary to community-acquired pneumonia and decompensated CHF. Slowly improving.   Assessment & Plan:   Principal Problem:   CAP (community acquired pneumonia) Active Problems:   Essential hypertension   Atrial fibrillation (HCC)   Hypothyroidism (acquired)   Acute systolic congestive heart failure (HCC)   Community-acquired pneumonia, streptococcal - Empirically started on Rocephin and azithromycin. However overnight 5/30, developed worsening symptoms, repeat chest x-ray shows infiltrate in left lower lobe consistent with pneumonia which is superimposed upon findings of COPD and chronic fibrosis. - Antibiotics were broadened to IV vancomycin and cefepime. - Blood cultures 2: Negative to date. Urine streptococcal antigen +. HIV screen negative. - Speech therapy evaluation appreciated and recommend regular diet and thin liquids. - DC vancomycin. Continue cefepime.  Acute on chronic systolic CHF/ICM with EF 20-25 percent by echo 05/2014, h/o VT s/p BiV ICD, CABG - Treated with IV Lasix. -2.465 L since admission. - Repeat 2-D echo shows LVEF 40-45 percent, improved. Severely elevated PA peak pressure. - Cardiology follow-up appreciated. Clinically improved. Lasix held for 5/31 and consider starting oral Lasix today-await cardiology input  Acute on chronic respiratory  failure with hypoxia - Patient has been on home oxygen 2 L/m continuously at home but as per family, does not keep it on all the time. - Overnight 5/30, decompensated and was on Ventimask. Improving. Titrate oxygen down as far as his saturations >88%. Incentive spirometry. - Chronic respiratory failure may be related to COPD and pulmonary fibrosis-? Related to amiodarone. - Now on oxygen 7 L/m via nasal cannula this morning-titrate to oxygen saturations >88.  Chronic atrial fibrillation - V paced on monitor. Digoxin level therapeutic at 1.5. Anticoagulated on Coumadin. Continue amiodarone and digoxin. - Discussed with Cardiology who reduced Carvedilol to 3.125 BID due to hypotension and will follow up regarding discontinuing amiodarone.  Hypothyroid - Continue levothyroxine - TSH 11.646, free T4: 1.33 (mildly elevated), free T3: 1.5 (low). Unclear etiology of abnormal TFTs-most likely from amiodarone. Cardiology to opine regarding possibility of coming off of amiodarone. Last TSH on 1/30:4.983.  Anxiety - When necessary Xanax.  Anemia - Stable. Started on folate for mild folate deficiency. Probably anemia of chronic disease.  Hypertension/hypotension - ? Chronic hypotension. Seems asymptomatic. Not on antihypertensives. Lasix held for today. Monitor closely. Blood pressures better this morning.  Sepsis due to community-acquired pneumonia - Sepsis physiology present on admission. Improved. Management as above.  Chronic kidney disease, stage 3-4 - Baseline creatinine may be in the 2 range. Last creatinine prior to admission: 1.94 on 01/09/2016. Creatinine improved to 1.69.   DVT prophylaxis: Anticoagulated on Coumadin Code Status: Full. Family Communication: Discussed with daughter at bedside. Disposition Plan: DC home when medically stable.   Consultants:   Cardiology  Procedures:   2-D echo 05/08/16: Study Conclusions  - Left ventricle: The cavity size was normal. Systolic  function was  mildly to moderately reduced.  The estimated ejection fraction was  in the range of 40% to 45%. There is akinesis of the  apicalanteroseptal myocardium. - Mitral valve: Calcified annulus. There was mild regurgitation. - Left atrium: The atrium was mildly dilated. - Right atrium: The atrium was mildly dilated. - Pulmonary arteries: Systolic pressure was severely increased. PA  peak pressure: 74 mm Hg (S).  Impressions:  - EF is improved when compared to prior ECHO.   Antimicrobials:   Azithromycin 1 dose on 5/27  IV Rocephin 5/27 > 5/30  IV cefepime 5/31 >  IV vancomycin 5/30 > 6/1   Subjective: Feeling better. Dyspnea improved. Mild dry cough. No chest pain reported. As per RN, no acute issues.  Objective:  Filed Vitals:   05/10/16 0800 05/10/16 0816 05/10/16 0817 05/10/16 1116  BP: 105/62     Pulse: 59 60  60  Temp:   98 F (36.7 C) 97.3 F (36.3 C)  TempSrc:   Oral Oral  Resp:      Height:      Weight:      SpO2: 88% 93%  94%    Intake/Output Summary (Last 24 hours) at 05/10/16 1353 Last data filed at 05/10/16 0946  Gross per 24 hour  Intake    480 ml  Output    825 ml  Net   -345 ml   Filed Weights   05/08/16 0614 05/09/16 0623 05/10/16 0445  Weight: 58.333 kg (128 lb 9.6 oz) 58.8 kg (129 lb 10.1 oz) 58.06 kg (128 lb)    Examination:  General exam: Pleasant elderly male sitting up comfortably in bed. Respiratory system: Diminished breath sounds in the bases with scattered left basal crackles. Rest of lung fields clear to auscultation. Respiratory effort normal. Cardiovascular system: S1 & S2 heard, RRR. No JVD, murmurs, rubs, gallops or clicks. No pedal edema. Telemetry: V paced rhythm in the 60s. Gastrointestinal system: Abdomen is nondistended, soft and nontender. No organomegaly or masses felt. Normal bowel sounds heard. Central nervous system: Alert and oriented. No focal neurological deficits. Extremities: Symmetric 5 x 5  power. Skin: No rashes, lesions or ulcers Psychiatry: Judgement and insight appear normal. Mood & affect appropriate.     Data Reviewed: I have personally reviewed following labs and imaging studies  CBC:  Recent Labs Lab 05/05/16 2040 05/06/16 0311 05/07/16 0142  WBC 13.7* 13.8* 7.8  NEUTROABS  --  10.8*  --   HGB 10.9* 10.8* 10.1*  HCT 35.4* 34.3* 32.2*  MCV 91.5 91.7 90.4  PLT 158 163 151   Basic Metabolic Panel:  Recent Labs Lab 05/05/16 2040 05/07/16 0142 05/08/16 0507 05/09/16 0443 05/10/16 0400  NA 132* 134* 134* 135 135  K 4.4 4.0 4.0 3.9 4.4  CL 105 103 103 102 103  CO2 19* 22 21* 24 25  GLUCOSE 112* 95 93 92 91  BUN 44* 47* 45* 44* 44*  CREATININE 2.61* 2.34* 1.89* 2.00* 1.69*  CALCIUM 8.4* 8.6* 8.5* 8.6* 8.8*   GFR: Estimated Creatinine Clearance: 30.1 mL/min (by C-G formula based on Cr of 1.69). Liver Function Tests:  Recent Labs Lab 05/06/16 1445  AST 29  ALT 14*  ALKPHOS 129*  BILITOT 1.0  PROT 6.2*  ALBUMIN 2.2*   No results for input(s): LIPASE, AMYLASE in the last 168 hours. No results for input(s): AMMONIA in the last 168 hours. Coagulation Profile:  Recent Labs Lab 05/06/16 0311 05/07/16 0142 05/08/16 0507 05/09/16 0443 05/10/16 0400  INR 2.34* 2.61* 2.66* 2.63* 2.73*  Cardiac Enzymes: No results for input(s): CKTOTAL, CKMB, CKMBINDEX, TROPONINI in the last 168 hours. BNP (last 3 results) No results for input(s): PROBNP in the last 8760 hours. HbA1C: No results for input(s): HGBA1C in the last 72 hours. CBG: No results for input(s): GLUCAP in the last 168 hours. Lipid Profile: No results for input(s): CHOL, HDL, LDLCALC, TRIG, CHOLHDL, LDLDIRECT in the last 72 hours. Thyroid Function Tests: No results for input(s): TSH, T4TOTAL, FREET4, T3FREE, THYROIDAB in the last 72 hours. Anemia Panel: No results for input(s): VITAMINB12, FOLATE, FERRITIN, TIBC, IRON, RETICCTPCT in the last 72 hours.  Sepsis Labs:  Recent  Labs Lab 05/06/16 1930 05/06/16 2240 05/07/16 0155 05/09/16 0442  LATICACIDVEN 2.7* 2.0 2.0 1.7    Recent Results (from the past 240 hour(s))  Blood culture (routine x 2)     Status: None (Preliminary result)   Collection Time: 05/05/16 10:36 PM  Result Value Ref Range Status   Specimen Description BLOOD RIGHT ANTECUBITAL  Final   Special Requests IN PEDIATRIC BOTTLE 4CC  Final   Culture NO GROWTH 3 DAYS  Final   Report Status PENDING  Incomplete  Blood culture (routine x 2)     Status: None (Preliminary result)   Collection Time: 05/05/16 10:41 PM  Result Value Ref Range Status   Specimen Description BLOOD RIGHT HAND  Final   Special Requests BOTTLES DRAWN AEROBIC AND ANAEROBIC 5CC   Final   Culture NO GROWTH 3 DAYS  Final   Report Status PENDING  Incomplete  MRSA PCR Screening     Status: None   Collection Time: 05/06/16  1:49 AM  Result Value Ref Range Status   MRSA by PCR NEGATIVE NEGATIVE Final    Comment:        The GeneXpert MRSA Assay (FDA approved for NASAL specimens only), is one component of a comprehensive MRSA colonization surveillance program. It is not intended to diagnose MRSA infection nor to guide or monitor treatment for MRSA infections.          Radiology Studies: Dg Chest Port 1 View  05/09/2016  CLINICAL DATA:  Hypoxia. EXAM: PORTABLE CHEST 1 VIEW COMPARISON:  Frontal and lateral views 05/08/2016, 05/05/2016 FINDINGS: Multi lead left-sided pacemaker remains in place. Patient is post median sternotomy. Mediastinal contours are unchanged, heart at the upper limits normal in size. Worsening right basilar opacity with developing patchy density. Increasing patchy left basilar opacity. Chronic bronchitic changes are again seen small bilateral pleural effusions suspected. IMPRESSION: Worsening left and developing right basilar opacities concerning for worsening and pneumonia. Given distribution, aspiration is considered. Suspect small pleural effusions.  Electronically Signed   By: Rubye Oaks M.D.   On: 05/09/2016 03:06        Scheduled Meds: . ALPRAZolam  0.5 mg Oral BID  . amiodarone  200 mg Oral Daily  . antiseptic oral rinse  7 mL Mouth Rinse BID  . calcitRIOL  0.25 mcg Oral Daily  . carvedilol  3.125 mg Oral BID  . ceFEPime (MAXIPIME) IV  1 g Intravenous Daily  . digoxin  0.0625 mg Oral QODAY  . folic acid  1 mg Oral Daily  . guaiFENesin  600 mg Oral BID  . ipratropium-albuterol  3 mL Nebulization Once  . levothyroxine  50 mcg Oral QAC breakfast  . multivitamin  1 tablet Oral Daily  . potassium chloride  10 mEq Oral Daily  . pravastatin  40 mg Oral q1800  . sodium chloride flush  3 mL Intravenous  Q12H  . warfarin  0.5 mg Oral q1800  . Warfarin - Pharmacist Dosing Inpatient   Does not apply q1800   Continuous Infusions:    LOS: 4 days    Time spent: 25 minutes.    [facility], MD [facility] Pager [phone]  If 7PM-7AM, please contact night-coverage [email or web address] Password [email or web address] 05/10/2016, 1:53 PM

## 2016-05-10 NOTE — Plan of Care (Signed)
Problem: Cardiac: Goal: Ability to achieve and maintain adequate cardiopulmonary perfusion will improve Outcome: Progressing Encourage OOB.  Problem: Education: Goal: Ability to demonstrate managment of disease process will improve Outcome: Progressing Provide educational material related to HF diagnosis.

## 2016-05-10 NOTE — Progress Notes (Addendum)
Physical Therapy Evaluation Patient Details Name: [name] [name] MRN: [id number] DOB: [birth year] Today's Date: 05/10/2016   History of Present Illness  75 yo male with PMH of ICM with EF 20-25% by echo 05/2014, h/o VT s/p BiV ICD, CABGx3 2001 (LIMA to LAD, SVG to OM, SVG to PDA), chronic afib presented with increasing SOB.  Pt with CAP and CHF.    Clinical Impression  Pt admitted with above diagnosis. Pt currently with functional limitations due to the deficits listed below (see PT Problem List). Pt was able to ambulate with RW but with poor safety awareness and need for cues and assist. Daughter states that she and wife can assist pt at home.  They have a rollator for pt and will bring tomorrow so pt can practice with it.  HH f/u recommended below.  Will follow acutely.   Pt will benefit from skilled PT to increase their independence and safety with mobility to allow discharge to the venue listed below.      Follow Up Recommendations Home health PT;Supervision/Assistance - 24 hour (HHOT, HHAide, HHRN)    Equipment Recommendations   pulse oximeter   Recommendations for Other Services       Precautions / Restrictions Precautions Precautions: Fall Restrictions Weight Bearing Restrictions: No      Mobility  Bed Mobility Overal bed mobility: Needs Assistance Bed Mobility: Supine to Sit     Supine to sit: Mod assist     General bed mobility comments: Needed assist for moving LEs off bed and ffor elevation of trunk reached for PT hand and pulled up.  States he does this at home reaching for wife to assist.   Transfers Overall transfer level: Needs assistance Equipment used: Rolling walker (2 wheeled) Transfers: Sit to/from Stand Sit to Stand: Mod assist         General transfer comment: Significant flexed posture with posterior lean.  Needed steadying assist.   Ambulation/Gait Ambulation/Gait assistance: Min assist;Mod assist;+2 safety/equipment Ambulation Distance (Feet):  85 Feet Assistive device: Rolling walker (2 wheeled) Gait Pattern/deviations: Step-through pattern;Decreased stride length;Trunk flexed;Wide base of support;Drifts right/left   Gait velocity interpretation: Below normal speed for age/gender General Gait Details: Pt could not ambulate safely without device. Pt was able to ambulate with RW with poor overall safety awareness needing max cues to use RW safely.  Needed cues to stay close to RW as well as to sequence steps and RW.  Pt with LOB at times.  When pt returned to room was not close to chair and began to reach for chair and this PT had to cue him to get closer however pt was fatiguing and he kept a flexed posture therefore had to physically assist him to maintain upright stance to take last few pivotal steps to chair with pt reaching for opposite armrest and sitting down before his LEs were against chair.  Discussed poor safety with pt  and daughter states that he has missed the chair before or come close to missing.  Discussed pts safety and that he needs to bring RW back all the way and LEs to touch the chair.  Pt agrees but still did not perform correctly.    Stairs            Wheelchair Mobility    Modified Rankin (Stroke Patients Only)       Balance Overall balance assessment: Needs assistance;History of Falls Sitting-balance support: No upper extremity supported;Feet supported Sitting balance-Leahy Scale: Fair   Postural control: Posterior lean  Standing balance support: Bilateral upper extremity supported;During functional activity Standing balance-Leahy Scale: Poor Standing balance comment: relies on RW for balance. Cannot stand statically without RW.              High level balance activites: Turns;Direction changes;Backward walking High Level Balance Comments: mod asssit for above due to poor safety with RW.              Pertinent Vitals/Pain Pain Assessment: No/denies pain  O2 on 6LO2 at rest 90-91%.  Pt  desat to 81% on 6LO2 with activity. Does improve with standing rest breaks and pursed lip breathing to 88%.  Once back to room, pt encouraged to perform incentive spirometer every hour for 10 reps.  Pt agrees.      Home Living Family/patient expects to be discharged to:: Private residence Living Arrangements: Spouse/significant other;Children Available Help at Discharge: Family;Available 24 hours/day Type of Home: House Home Access: Level entry     Home Layout: One level Home Equipment: Walker - 4 wheels;Cane - single point;Wheelchair - [employer];Shower seat;Other (comment) (2LO2 at home) Additional Comments: daughter Angelique Blonder snd wife Harriett Sine    Prior Function Level of Independence: Needs assistance   Gait / Transfers Assistance Needed: Pt ambulated with cane with Modif I and has had some falls.   ADL's / Homemaking Assistance Needed: Pt had supervision for ADLs.        Hand Dominance        Extremity/Trunk Assessment   Upper Extremity Assessment: Defer to OT evaluation           Lower Extremity Assessment: Generalized weakness      Cervical / Trunk Assessment: Kyphotic  Communication   Communication: No difficulties  Cognition Arousal/Alertness: Awake/alert Behavior During Therapy: WFL for tasks assessed/performed Overall Cognitive Status: Within Functional Limits for tasks assessed                      General Comments General comments (skin integrity, edema, etc.): Daughter states that someone brought pt a rollator.  Asked wife to bring rollator so PT can practice with pt.  She agreed.     Exercises        Assessment/Plan    PT Assessment Patient needs continued PT services  PT Diagnosis Generalized weakness   PT Problem List Decreased activity tolerance;Decreased balance;Decreased mobility;Decreased knowledge of use of DME;Decreased safety awareness;Decreased knowledge of precautions;Cardiopulmonary status limiting activity  PT  Treatment Interventions DME instruction;Gait training;Functional mobility training;Therapeutic exercise;Therapeutic activities;Balance training;Patient/family education   PT Goals (Current goals can be found in the Care Plan section) Acute Rehab PT Goals Patient Stated Goal: to go home PT Goal Formulation: With patient Time For Goal Achievement: 05/24/16 Potential to Achieve Goals: Good    Frequency Min 3X/week   Barriers to discharge        Co-evaluation               End of Session Equipment Utilized During Treatment: Gait belt;Oxygen Activity Tolerance: Patient limited by fatigue Patient left: in chair;with call bell/phone within reach;with family/visitor present Nurse Communication: Mobility status         Time: 1610-9604 PT Time Calculation (min) (ACUTE ONLY): 46 min   Charges:   PT Evaluation $PT Eval Moderate Complexity: 1 Procedure PT Treatments $Gait Training: 8-22 mins $Self Care/Home Management: 8-22   PT G CodesTawni Millers F 2016-05-26, 10:46 AM [facility] Acute Rehabilitation [phone] (pager)

## 2016-05-10 NOTE — Progress Notes (Addendum)
Subjective: Sitting up in bed eating BK - feeling better currently nasal cannula  Objective: Vital signs in last 24 hours: Temp:  [97.4 F (36.3 C)-98.1 F (36.7 C)] 98 F (36.7 C) (06/01 0817) Pulse Rate:  [59-62] 60 (06/01 0816) Resp:  [18-26] 24 (06/01 0445) BP: (86-108)/(55-75) 108/57 mmHg (06/01 0445) SpO2:  [87 %-96 %] 93 % (06/01 0816) Weight:  [128 lb (58.06 kg)] 128 lb (58.06 kg) (06/01 0445) Weight change: -1 lb 10.1 oz (-0.74 kg) Last BM Date: 05/08/16 Intake/Output from previous day: -265 05/31 0701 - 06/01 0700 In: 960 [P.O.:960] Out: 1225 [Urine:1225] Intake/Output this shift:    WU:JWJXBJY:NWGNFAOZ affect, NAD Skin:Warm and dry, brisk capillary refill HEENT:normocephalic, sclera clear, mucus membranes moist Neck:supple, mild JVD sitting up right Heart:S1S2 RRR without murmur, gallup, rub or click Lungs: without rales, + rhonchi, +  Wheezes and increase of exp wheezes after cough. HYQ:MVHQ, non tender, + BS, do not palpate liver spleen or masses Ext:no lower ext edema, 2+ pedal pulses, 2+ radial pulses Neuro:alert and oriented X 3, MAE, follows commands, + facial symmetry   Lab Results: No results for input(s): WBC, HGB, HCT, PLT in the last 72 hours. BMET  Recent Labs  05/09/16 0443 05/10/16 0400  NA 135 135  K 3.9 4.4  CL 102 103  CO2 24 25  GLUCOSE 92 91  BUN 44* 44*  CREATININE 2.00* 1.69*  CALCIUM 8.6* 8.8*   No results for input(s): TROPONINI in the last 72 hours.  Invalid input(s): CK, MB  Lab Results  Component Value Date   CHOL 104* 07/11/2015   HDL 32* 07/11/2015   LDLCALC 37 07/11/2015   TRIG 173* 07/11/2015   CHOLHDL 3.3 07/11/2015   No results found for: HGBA1C   Lab Results  Component Value Date   TSH 11.646* 05/06/2016       Studies/Results: Dg Chest 2 View  05/08/2016  CLINICAL DATA:  Chest congestion shortness of breath and weakness; former smoker EXAM: CHEST  2 VIEW COMPARISON:  PA and lateral  chest x-ray of May 05, 2016 FINDINGS: The lungs are well-expanded. There is confluent density in the left lower lobe more conspicuous than on the previous study. Small amounts of pleural fluid are present bilaterally blunting the costophrenic angles. The interstitial markings elsewhere in both lungs remain mildly increased. The cardiac silhouette is top-normal in size. The permanent pacemaker defibrillator is in stable position. There are post CABG changes. The bony thorax exhibits no acute abnormality. IMPRESSION: Infiltrate in the left lower lobe posteriorly consistent with pneumonia. This is superimposed upon findings of COPD and chronic fibrosis. No CHF. Electronically Signed   By: David  [name] M.D.   On: 05/08/2016 09:20   Dg Chest Port 1 View  05/09/2016  CLINICAL DATA:  Hypoxia. EXAM: PORTABLE CHEST 1 VIEW COMPARISON:  Frontal and lateral views 05/08/2016, 05/05/2016 FINDINGS: Multi lead left-sided pacemaker remains in place. Patient is post median sternotomy. Mediastinal contours are unchanged, heart at the upper limits normal in size. Worsening right basilar opacity with developing patchy density. Increasing patchy left basilar opacity. Chronic bronchitic changes are again seen small bilateral pleural effusions suspected. IMPRESSION: Worsening left and developing right basilar opacities concerning for worsening and pneumonia. Given distribution, aspiration is considered. Suspect small pleural effusions. Electronically Signed   By: Rubye Oaks M.D.   On: 05/09/2016 03:06   Echocardiogram 05/08/2014  LV EF: 40% - 45%  ------------------------------------------------------------------- Indications: CHF - 428.0.  -------------------------------------------------------------------  History: PMH: Pacemaker. Defibrillator. Ishemic cardiomyopathy. Chronic kidney disease. Atrial fibrillation. Coronary artery disease. Stroke. PMH: Myocardial infarction. Risk factors: Current  tobacco use. Hypertension. Dyslipidemia.  ------------------------------------------------------------------- Study Conclusions  - Left ventricle: The cavity size was normal. Systolic function was  mildly to moderately reduced. The estimated ejection fraction was  in the range of 40% to 45%. There is akinesis of the  apicalanteroseptal myocardium. - Mitral valve: Calcified annulus. There was mild regurgitation. - Left atrium: The atrium was mildly dilated. - Right atrium: The atrium was mildly dilated. - Pulmonary arteries: Systolic pressure was severely increased. PA  peak pressure: 74 mm Hg (S).  Impressions:  - EF is improved when compared to prior ECHO.       Medications: I have reviewed the patient's current medications. Scheduled Meds: . ALPRAZolam  0.5 mg Oral BID  . amiodarone  200 mg Oral Daily  . antiseptic oral rinse  7 mL Mouth Rinse BID  . calcitRIOL  0.25 mcg Oral Daily  . carvedilol  3.125 mg Oral BID  . ceFEPime (MAXIPIME) IV  1 g Intravenous Daily  . digoxin  0.0625 mg Oral QODAY  . folic acid  1 mg Oral Daily  . guaiFENesin  600 mg Oral BID  . ipratropium-albuterol  3 mL Nebulization Once  . levothyroxine  50 mcg Oral QAC breakfast  . multivitamin  1 tablet Oral Daily  . potassium chloride  10 mEq Oral Daily  . pravastatin  40 mg Oral q1800  . sodium chloride flush  3 mL Intravenous Q12H  . vancomycin  500 mg Intravenous Q24H  . warfarin  0.5 mg Oral q1800  . 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 Acute on chronic systolic HF/ ICM with baseline EF 20-25% by echo 05/2014 - s/p IV diuresis, weight down from 135 down to 129 lbs. I/O -2.5 L.  - echo shows severely elevated PA peak pressure , EF 40-45%. Compare to previous echo in 2015, EF has improved from 20-25% - Despite persistent JVD, i am not convinced he is still fluid overloaded, since he did receive 1 dose of IV lasix this morning, I will hold further IV lasix  Currently on nasal cannula,  speech eval with no significant  aspiration. The elevated PA peak pressure of may indicate pulmonary issue rather than HF, hopefully will improve once pulmonary issue resolve --coreg decreased to 3.125 BID   2. Strep pneumonia: per IM, CXR on 5/30 shows persistent LLL PNA, repeat CXR on 5/31 showed worsening L and developing R basilar opacities concerning for worsening PNA. And CXR on 31st with increase of PNA possible aspiration.   O2 sat dropped overnight on 5/31. Still hypotensive with SBP 90s to 108.Marland Kitchen Hospitalist night coverage given IV lasix at 4AM 05/09/16. Cr went up to 2.0.  IV rocephin stopped, IV Vanc 1000mg  given yesterday and today and  Now on cefepIme.  And pt feeling better.   -  2575 since admit  3. Acute on CKD stage III - Cr improving with diuresis, reached 1.89 n 5/30, further diuresis increased it to 2.0 and today at 1.69  Lasix held-- ? Resume 40 mg daily   4. H/o VT s/p BiV ICD-V pacing   5. 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 Chronic afib, rate controlled on coumadin  INR at 2.73  7. Hypothyroid on synthroid 50 mcg with TSH at 11    LOS: 4 days   Time spent with pt. :15 minutes. Nada Boozer  Nurse Practitioner Certified Pager [phone] or after 5pm and on weekends  call [phone] 05/10/2016, 8:27 AM   Patient seen and examined  I agree with findings as noted by L Ingold above   On exam, Lungs are rel clear  Card exam RRR  No S3  Ext no edema  Would keep same regimen Add lasix po daily.  Dietrich Pates   Volume status appears OK  Dietrich Pates

## 2016-05-11 DIAGNOSIS — I5022 Chronic systolic (congestive) heart failure: Secondary | ICD-10-CM

## 2016-05-11 LAB — COMPREHENSIVE METABOLIC PANEL
ALBUMIN: 2 g/dL — AB (ref 3.5–5.0)
ALT: 14 U/L — ABNORMAL LOW (ref 17–63)
ANION GAP: 9 (ref 5–15)
AST: 41 U/L (ref 15–41)
Alkaline Phosphatase: 137 U/L — ABNORMAL HIGH (ref 38–126)
BUN: 44 mg/dL — AB (ref 6–20)
CHLORIDE: 104 mmol/L (ref 101–111)
CO2: 25 mmol/L (ref 22–32)
Calcium: 9 mg/dL (ref 8.9–10.3)
Creatinine, Ser: 1.81 mg/dL — ABNORMAL HIGH (ref 0.61–1.24)
GFR calc Af Amer: 40 mL/min — ABNORMAL LOW (ref >60)
GFR calc non Af Amer: 34 mL/min — ABNORMAL LOW (ref >60)
GLUCOSE: 85 mg/dL (ref 65–99)
POTASSIUM: 4.4 mmol/L (ref 3.5–5.1)
Sodium: 138 mmol/L (ref 135–145)
Total Bilirubin: 0.9 mg/dL (ref 0.3–1.2)
Total Protein: 5.9 g/dL — ABNORMAL LOW (ref 6.5–8.1)

## 2016-05-11 LAB — CULTURE, BLOOD (ROUTINE X 2)
CULTURE: NO GROWTH
Culture: NO GROWTH

## 2016-05-11 LAB — PROTIME-INR
INR: 2.84 — AB (ref 0.00–1.49)
PROTHROMBIN TIME: 29.4 s — AB (ref 11.6–15.2)

## 2016-05-11 NOTE — Progress Notes (Addendum)
Subjective: No chest pain, did walk in hall yesterday.  Would like to go home.  But seems weak today  Objective: Vital signs in last 24 hours: Temp:  [97.3 F (36.3 C)-98.5 F (36.9 C)] 97.7 F (36.5 C) (06/02 0855) Pulse Rate:  [58-62] 62 (06/02 0855) Resp:  [21-24] 21 (06/02 0855) BP: (76-117)/(43-66) 117/60 mmHg (06/02 0855) SpO2:  [88 %-99 %] 93 % (06/02 0855) Weight:  [131 lb 8 oz (59.648 kg)] 131 lb 8 oz (59.648 kg) (06/02 0525) Weight change: 3 lb 8 oz (1.588 kg) Last BM Date: 05/08/16 Intake/Output from previous day: -695 06/01 0701 - 06/02 0700 In: 580 [P.O.:480; IV Piggyback:100] Out: 1275 [Urine:1275] Intake/Output this shift: Total I/O In: 360 [P.O.:360] Out: -   PE: General:Pleasant affect, NAD Skin:Warm and dry, brisk capillary refill HEENT:normocephalic, sclera clear, mucus membranes moist Heart:S1S2 RRR without murmur, gallup, rub or click Lungs: without rales, + exp rhonchi, occ wheezes- congested cough OZH:YQMV, non tender, + BS, do not palpate liver spleen or masses Ext:no lower ext edema, 2+ pedal pulses, 2+ radial pulses Neuro:alert and oriented X 3, MAE, follows commands, + facial symmetry Tele: V pacing  Underlying a fib.  Lab Results: No results for input(s): WBC, HGB, HCT, PLT in the last 72 hours. BMET  Recent Labs  05/10/16 0400 05/11/16 0413  NA 135 138  K 4.4 4.4  CL 103 104  CO2 25 25  GLUCOSE 91 85  BUN 44* 44*  CREATININE 1.69* 1.81*  CALCIUM 8.8* 9.0   No results for input(s): TROPONINI in the last 72 hours.  Invalid input(s): CK, MB  Lab Results  Component Value Date   CHOL 104* 07/11/2015   HDL 32* 07/11/2015   LDLCALC 37 07/11/2015   TRIG 173* 07/11/2015   CHOLHDL 3.3 07/11/2015   No results found for: HGBA1C   Lab Results  Component Value Date   TSH 11.646* 05/06/2016    Hepatic Function Panel  Recent Labs  05/11/16 0413  PROT 5.9*  ALBUMIN 2.0*  AST 41  ALT 14*  ALKPHOS 137*  BILITOT  0.9   No results for input(s): CHOL in the last 72 hours. No results for input(s): PROTIME in the last 72 hours.     Studies/Results: No results found.  Medications: I have reviewed the patient's current medications. Scheduled Meds: . ALPRAZolam  0.5 mg Oral BID  . amiodarone  200 mg Oral Daily  . antiseptic oral rinse  7 mL Mouth Rinse BID  . calcitRIOL  0.25 mcg Oral Daily  . carvedilol  3.125 mg Oral BID  . ceFEPime (MAXIPIME) IV  1 g Intravenous Daily  . digoxin  0.0625 mg Oral QODAY  . folic acid  1 mg Oral Daily  . furosemide  40 mg Oral BID  . guaiFENesin  600 mg Oral BID  . ipratropium-albuterol  3 mL Nebulization Once  . levothyroxine  50 mcg Oral QAC breakfast  . multivitamin  1 tablet Oral Daily  . potassium chloride  10 mEq Oral Daily  . pravastatin  40 mg Oral q1800  . sodium chloride flush  3 mL Intravenous Q12H  . warfarin  0.5 mg Oral q1800  . Warfarin - Pharmacist Dosing Inpatient   Does not apply q1800   Continuous Infusions:  PRN Meds:.sodium chloride, acetaminophen, ALPRAZolam **AND** ALPRAZolam, guaiFENesin-dextromethorphan, ipratropium-albuterol, loperamide, ondansetron (ZOFRAN) IV, sodium chloride flush  Assessment/Plan: Principal Problem:   CAP (community acquired pneumonia) Active Problems:  Essential hypertension   Atrial fibrillation (HCC)   Hypothyroidism (acquired)   Acute systolic congestive heart failure (HCC)  1. Acute on chronic systolic HF/ ICM with baseline EF 20-25% by echo 05/2014 - s/p IV diuresis, weight down from 135 down to 129 lbs now at 131 lbs.  I/O -3.2 L.  - echo shows severely elevated PA peak pressure , EF 40-45%. Compare to previous echo in 2015, EF has improved from 20-25%  Currently on nasal cannula, speech eval with no significant aspiration. The elevated PA peak pressure of may indicate pulmonary issue rather than HF, hopefully will improve once pulmonary issue  resolve --coreg decreased to 3.125 BID   2. Strep pneumonia: per IM, CXR on 5/30 shows persistent LLL PNA, repeat CXR on 5/31 showed worsening L and developing R basilar opacities concerning for worsening PNA. And CXR on 31st with increase of PNA possible aspiration. O2 sat dropped overnight on 5/31. Still hypotensive with SBP 90s to 108.Marland Kitchen Hospitalist night coverage given IV lasix at 4AM 05/09/16. Cr went up to 2.0. IV rocephin stopped, IV Vanc  given yesterday and today and Now on cefepIme. And pt feeling better.   -3265 since admit  3. Acute on CKD stage III - Cr improving with diuresis, reached 1.89 n 5/30, further diuresis increased it to 2.0-->  1.69--> today 1.81 Lasix 40 mg BID  Though BP low during the night   4. H/o VT s/p BiV ICD-V pacing   5. 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 Chronic afib, rate controlled on coumadin INR at 2.84  7. Hypothyroid on synthroid 50 mcg with TSH at 11 Free T4 high at 1.33, Free T3 Sl low at 1.5 Keep on synthroid  Reluctant to stop amio as this is being used for VT  8. Hypotension 76/43  And 80s systolic overnight.  this AM at 117/60   LOS: 5 days   Time spent with pt. :15 minutes. Nada Boozer  Nurse Practitioner Certified Pager [phone] or after 5pm and on weekends call [phone] 05/11/2016, 9:01 AM  Patient seen and exmined  I agree with findings of L INgold   BP low earlier today   On exam Lungs No rales  Rhonchi on L Cardiac RRR  No signif murmurs  Ext witout edema On PO lasix  For CHF   Coreg decreased to 3.124 daily  .   No new recommendations for now.  Pt on amiodarone for VT  Also on Synthroid (see note above)  WIll review with EP  Reluctant to stop given indication   Dietrich Pates

## 2016-05-11 NOTE — Progress Notes (Signed)
PT Cancellation Note  Patient Details Name: [name] [name] MRN: [id number] DOB: March 20, 1941   Cancelled Treatment:    Reason Eval/Treat Not Completed: Patient declined, no reason specified.  Will try again later if time allows.   Ivar Drape 05/11/2016, 2:08 PM    Samul Dada, PT MS Acute Rehab Dept. Number: [facility] [id number] and [facility] [phone]

## 2016-05-11 NOTE — Progress Notes (Signed)
ANTICOAGULATION CONSULT NOTE - Follow Up Consult  Pharmacy Consult for Coumadin Indication: atrial fibrillation  Allergies  Allergen Reactions  . Nitroglycerin Other (See Comments)    Became severely hypotensive  . Ace Inhibitors Other (See Comments)    Listed as intolerance - Unknown Reaction per previous records.    Patient Measurements: Height: 5\' 5"  (165.1 cm) Weight: 131 lb 8 oz (59.648 kg) IBW/kg (Calculated) : 61.5  Vital Signs: Temp: 97.6 F (36.4 C) (06/02 1234) Temp Source: Oral (06/02 1234) BP: 107/62 mmHg (06/02 1234) Pulse Rate: 55 (06/02 1234)  Labs:  Recent Labs  05/09/16 0443 05/10/16 0400 05/11/16 0413  LABPROT 27.7* 28.5* 29.4*  INR 2.63* 2.73* 2.84*  CREATININE 2.00* 1.69* 1.81*    Estimated Creatinine Clearance: 28.8 mL/min (by C-G formula based on Cr of 1.81).   Medications:  Scheduled:  . ALPRAZolam  0.5 mg Oral BID  . amiodarone  200 mg Oral Daily  . antiseptic oral rinse  7 mL Mouth Rinse BID  . calcitRIOL  0.25 mcg Oral Daily  . carvedilol  3.125 mg Oral BID  . ceFEPime (MAXIPIME) IV  1 g Intravenous Daily  . digoxin  0.0625 mg Oral QODAY  . folic acid  1 mg Oral Daily  . furosemide  40 mg Oral BID  . guaiFENesin  600 mg Oral BID  . ipratropium-albuterol  3 mL Nebulization Once  . levothyroxine  50 mcg Oral QAC breakfast  . multivitamin  1 tablet Oral Daily  . potassium chloride  10 mEq Oral Daily  . pravastatin  40 mg Oral q1800  . sodium chloride flush  3 mL Intravenous Q12H  . warfarin  0.5 mg Oral q1800  . Warfarin - Pharmacist Dosing Inpatient   Does not apply q1800    Assessment: 75 yo M admitted with CAP and HF exacerbation.  Pt on Coumadin PTA for hx of afib.  INR 2.84 is therapeutic on home regimen. Will continue.  Goal of Therapy:  INR 2-3 Monitor platelets by anticoagulation protocol: Yes   Plan:  Coumadin 0.5mg  PO daily Continue daily INR for now.  Leota Sauers Pharm.D. CPP, BCPS Clinical  Pharmacist [phone] 05/11/2016 2:38 PM

## 2016-05-11 NOTE — Progress Notes (Signed)
PROGRESS NOTE  [name] [name]  YQM:[id number] DOB: 11/24/1941  DOA: 05/05/2016 PCP: Pamelia Hoit, MD   Brief Narrative:  75 year old male with a PMH of chronic systolic CHF, last EF 20-25 percent in June 2015, ICM, VT s/p ICD & PPM, stage III chronic kidney disease, chronic A. 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 Hypoxic in 70s with EMS. Pacemaker evaluation in ED and said to be OK. Admitted for acute on chronic hypoxic respiratory failure secondary to community-acquired pneumonia and decompensated CHF. Slowly improving.   Assessment & Plan:   Principal Problem:   CAP (community acquired pneumonia) Active Problems:   Essential hypertension   Atrial fibrillation (HCC)   Hypothyroidism (acquired)   Acute systolic congestive heart failure (HCC)   Community-acquired pneumonia, streptococcal - Empirically started on Rocephin and azithromycin. However overnight 5/30, developed worsening symptoms, repeat chest x-ray shows infiltrate in left lower lobe consistent with pneumonia which is superimposed upon findings of COPD and chronic fibrosis. - Antibiotics were broadened to IV vancomycin and cefepime. - Blood cultures 2: Negative to date. Urine streptococcal antigen +. HIV screen negative. - Speech therapy evaluation appreciated and recommend regular diet and thin liquids. - DC vancomycin. Continue cefepime. - Follow-up chest x-ray 6/3. Chest physical therapy. Recommend follow-up chest x-ray in 3-4 weeks to ensure resolution of pneumonia findings. -Consider transitioning to oral antibiotics in the next 24 hours.   Acute on chronic systolic CHF/ICM with EF 20-25 percent by echo 05/2014, h/o VT s/p BiV ICD, CABG - Treated with IV Lasix. - 3.085 L since admission. - Repeat 2-D echo shows LVEF 40-45 percent, improved.  Severely elevated PA peak pressure. - Cardiology follow-up appreciated. Clinically improved. Now on oral Lasix. Seems compensated.  Acute on chronic respiratory failure with hypoxia - Patient has been on home oxygen 2 L/m continuously at home but as per family, does not keep it on all the time. - Overnight 5/30, decompensated and was on Ventimask. Improving. Titrate oxygen down as far as his saturations >88%. Incentive spirometry. - Chronic respiratory failure may be related to COPD and pulmonary fibrosis-? Related to amiodarone. - Titrated down this morning to oxygen 3 L/m. Monitor. Discussed with RN.  Chronic atrial fibrillation - V paced on monitor. Digoxin level therapeutic at 1.5. Anticoagulated on Coumadin. Continue amiodarone and digoxin. - Discussed with Cardiology who reduced Carvedilol to 3.125 BID due to hypotension and will follow up regarding discontinuing amiodarone. Awaiting decision regarding amiodarone.  Hypothyroid - Continue levothyroxine - TSH 11.646, free T4: 1.33 (mildly elevated), free T3: 1.5 (low). Unclear etiology of abnormal TFTs-most likely from amiodarone. Cardiology to opine regarding possibility of coming off of amiodarone. Last TSH on 1/30:4.983.  Anxiety - When necessary Xanax.  Anemia - Stable. Started on folate for mild folate deficiency. Probably anemia of chronic disease.  Hypertension/hypotension - ? Chronic hypotension. Seems asymptomatic.  - Blood pressure certainly this morning in the 80s/50s. Remains on low-dose carvedilol and Lasix. Monitor  Sepsis due to community-acquired pneumonia - Sepsis physiology present on admission. Improved. Management as above.  Chronic kidney disease, stage 3-4 - Baseline creatinine may be in the 2 range. Last creatinine prior to admission: 1.94 on 01/09/2016. Creatinine improved to fluctuating in the hospital but stable.   DVT prophylaxis: Anticoagulated on Coumadin Code Status: Full. Family Communication:  Discussed with spouse  at bedside. Disposition  Plan: DC home when medically stable-Possibly in the next 48 hours.   Consultants:   Cardiology  Procedures:   2-D echo 05/08/16: Study Conclusions  - Left ventricle: The cavity size was normal. Systolic function was  mildly to moderately reduced. The estimated ejection fraction was  in the range of 40% to 45%. There is akinesis of the  apicalanteroseptal myocardium. - Mitral valve: Calcified annulus. There was mild regurgitation. - Left atrium: The atrium was mildly dilated. - Right atrium: The atrium was mildly dilated. - Pulmonary arteries: Systolic pressure was severely increased. PA  peak pressure: 74 mm Hg (S).  Impressions:  - EF is improved when compared to prior ECHO.   Antimicrobials:   Azithromycin 1 dose on 5/27  IV Rocephin 5/27 > 5/30  IV cefepime 5/31 >  IV vancomycin 5/30 > 6/1   Subjective: Intermittent mild dyspnea. Intermittent cough with white sputum. As per spouse, looks better than on admission.   Objective:  Filed Vitals:   05/11/16 1234 05/11/16 1300 05/11/16 1425 05/11/16 1448  BP: 107/62     Pulse: 55 59 59 59  Temp: 97.6 F (36.4 C)     TempSrc: Oral     Resp: 21 24 21 25   Height:      Weight:      SpO2: 90% 91% 94% 89%    Intake/Output Summary (Last 24 hours) at 05/11/16 1620 Last data filed at 05/11/16 1233  Gross per 24 hour  Intake    580 ml  Output   1575 ml  Net   -995 ml   Filed Weights   05/09/16 0623 05/10/16 0445 05/11/16 0525  Weight: 58.8 kg (129 lb 10.1 oz) 58.06 kg (128 lb) 59.648 kg (131 lb 8 oz)    Examination:  General exam: Pleasant elderly male sitting up comfortably in bed. Respiratory system: Diminished breath sounds in the bases with scattered left basal crackles-Decreasing & occasional posterior rhonchi . Rest of lung fields clear to auscultation. Respiratory effort normal. Cardiovascular system: S1 & S2 heard, RRR. No JVD, murmurs, rubs,  gallops or clicks. No pedal edema. Telemetry: V paced rhythm in the 60s. Gastrointestinal system: Abdomen is nondistended, soft and nontender. No organomegaly or masses felt. Normal bowel sounds heard. Central nervous system: Alert and oriented. No focal neurological deficits. Extremities: Symmetric 5 x 5 power. Skin: No rashes, lesions or ulcers Psychiatry: Judgement and insight appear normal. Mood & affect appropriate.     Data Reviewed: I have personally reviewed following labs and imaging studies  CBC:  Recent Labs Lab 05/05/16 2040 05/06/16 0311 05/07/16 0142  WBC 13.7* 13.8* 7.8  NEUTROABS  --  10.8*  --   HGB 10.9* 10.8* 10.1*  HCT 35.4* 34.3* 32.2*  MCV 91.5 91.7 90.4  PLT 158 163 151   Basic Metabolic Panel:  Recent Labs Lab 05/07/16 0142 05/08/16 0507 05/09/16 0443 05/10/16 0400 05/11/16 0413  NA 134* 134* 135 135 138  K 4.0 4.0 3.9 4.4 4.4  CL 103 103 102 103 104  CO2 22 21* 24 25 25   GLUCOSE 95 93 92 91 85  BUN 47* 45* 44* 44* 44*  CREATININE 2.34* 1.89* 2.00* 1.69* 1.81*  CALCIUM 8.6* 8.5* 8.6* 8.8* 9.0   GFR: Estimated Creatinine Clearance: 28.8 mL/min (by C-G formula based on Cr of 1.81). Liver Function Tests:  Recent Labs Lab 05/06/16 1445 05/11/16 0413  AST 29 41  ALT 14* 14*  ALKPHOS 129* 137*  BILITOT 1.0 0.9  PROT 6.2*  5.9*  ALBUMIN 2.2* 2.0*   No results for input(s): LIPASE, AMYLASE in the last 168 hours. No results for input(s): AMMONIA in the last 168 hours. Coagulation Profile:  Recent Labs Lab 05/07/16 0142 05/08/16 0507 05/09/16 0443 05/10/16 0400 05/11/16 0413  INR 2.61* 2.66* 2.63* 2.73* 2.84*   Cardiac Enzymes: No results for input(s): CKTOTAL, CKMB, CKMBINDEX, TROPONINI in the last 168 hours. BNP (last 3 results) No results for input(s): PROBNP in the last 8760 hours. HbA1C: No results for input(s): HGBA1C in the last 72 hours. CBG: No results for input(s): GLUCAP in the last 168 hours. Lipid Profile: No  results for input(s): CHOL, HDL, LDLCALC, TRIG, CHOLHDL, LDLDIRECT in the last 72 hours. Thyroid Function Tests: No results for input(s): TSH, T4TOTAL, FREET4, T3FREE, THYROIDAB in the last 72 hours. Anemia Panel: No results for input(s): VITAMINB12, FOLATE, FERRITIN, TIBC, IRON, RETICCTPCT in the last 72 hours.  Sepsis Labs:  Recent Labs Lab 05/06/16 1930 05/06/16 2240 05/07/16 0155 05/09/16 0442  LATICACIDVEN 2.7* 2.0 2.0 1.7    Recent Results (from the past 240 hour(s))  Blood culture (routine x 2)     Status: None   Collection Time: 05/05/16 10:36 PM  Result Value Ref Range Status   Specimen Description BLOOD RIGHT ANTECUBITAL  Final   Special Requests IN PEDIATRIC BOTTLE 4CC  Final   Culture NO GROWTH 5 DAYS  Final   Report Status 05/11/2016 FINAL  Final  Blood culture (routine x 2)     Status: None   Collection Time: 05/05/16 10:41 PM  Result Value Ref Range Status   Specimen Description BLOOD RIGHT HAND  Final   Special Requests BOTTLES DRAWN AEROBIC AND ANAEROBIC 5CC   Final   Culture NO GROWTH 5 DAYS  Final   Report Status 05/11/2016 FINAL  Final  MRSA PCR Screening     Status: None   Collection Time: 05/06/16  1:49 AM  Result Value Ref Range Status   MRSA by PCR NEGATIVE NEGATIVE Final    Comment:        The GeneXpert MRSA Assay (FDA approved for NASAL specimens only), is one component of a comprehensive MRSA colonization surveillance program. It is not intended to diagnose MRSA infection nor to guide or monitor treatment for MRSA infections.          Radiology Studies: No results found.      Scheduled Meds: . ALPRAZolam  0.5 mg Oral BID  . amiodarone  200 mg Oral Daily  . antiseptic oral rinse  7 mL Mouth Rinse BID  . calcitRIOL  0.25 mcg Oral Daily  . carvedilol  3.125 mg Oral BID  . ceFEPime (MAXIPIME) IV  1 g Intravenous Daily  . digoxin  0.0625 mg Oral QODAY  . folic acid  1 mg Oral Daily  . furosemide  40 mg Oral BID  . guaiFENesin   600 mg Oral BID  . ipratropium-albuterol  3 mL Nebulization Once  . levothyroxine  50 mcg Oral QAC breakfast  . multivitamin  1 tablet Oral Daily  . potassium chloride  10 mEq Oral Daily  . pravastatin  40 mg Oral q1800  . sodium chloride flush  3 mL Intravenous Q12H  . warfarin  0.5 mg Oral q1800  . Warfarin - Pharmacist Dosing Inpatient   Does not apply q1800   Continuous Infusions:    LOS: 5 days    Time spent: 25 minutes.    [facility], MD [facility] Pager [phone]  If 7PM-7AM, please contact night-coverage [email or web address] Password [email or web address] 05/11/2016, 4:20 PM

## 2016-05-11 NOTE — Care Management Note (Signed)
Case Management Note  Patient Details  Name: LARREN PLAYFORD MRN: [id number] Date of Birth: [birth year]  Subjective/Objective: Pt admitted for SOB. Pt is from home with wife. Pt usually wears 2 L of 02 at home. Being treated for PNA with IV antibiotic therapy.Pt has DME Shower Chair, [facility] and [place] x 2.                     Action/Plan: CM was able to speak with wife in regards to disposition needs. Wife is agreeable to [facility]. Agency List provided to pt and wife chose [facility]. CM did make referral to Liaison Drew. SOC to begin within 24-48 hrs of d/c. No further needs from CM at this time.    Expected Discharge Date:                  Expected Discharge Plan:  Home w Home Health Services  In-House Referral:  NA  Discharge planning Services  CM Consult  Post Acute Care Choice:  Home Health Choice offered to:  Patient  DME Arranged:  N/A DME Agency:  NA  HH Arranged:  PT HH Agency:  Other - See comment [facility])  Status of Service:  Completed, signed off  Medicare Important Message Given:  Yes Date Medicare IM Given:    Medicare IM give by:    Date Additional Medicare IM Given:    Additional Medicare Important Message give by:     If discussed at Long Length of Stay Meetings, dates discussed:    Additional Comments:  Gala Lewandowsky, RN 05/11/2016, 4:36 PM

## 2016-05-11 NOTE — Care Management Important Message (Signed)
Important Message  Patient Details  Name: SYLYS SOBIERAJ MRN: [id number] Date of Birth: February 16, 1941   Medicare Important Message Given:  Yes    Bernadette Hoit 05/11/2016, 9:41 AM

## 2016-05-12 ENCOUNTER — Inpatient Hospital Stay (HOSPITAL_COMMUNITY): Payer: Medicare Other

## 2016-05-12 DIAGNOSIS — J9621 Acute and chronic respiratory failure with hypoxia: Secondary | ICD-10-CM

## 2016-05-12 DIAGNOSIS — Z8679 Personal history of other diseases of the circulatory system: Secondary | ICD-10-CM

## 2016-05-12 DIAGNOSIS — I25810 Atherosclerosis of coronary artery bypass graft(s) without angina pectoris: Secondary | ICD-10-CM

## 2016-05-12 DIAGNOSIS — I482 Chronic atrial fibrillation: Secondary | ICD-10-CM

## 2016-05-12 LAB — EXPECTORATED SPUTUM ASSESSMENT W REFEX TO RESP CULTURE

## 2016-05-12 LAB — CBC
HCT: 36.4 % — ABNORMAL LOW (ref 39.0–52.0)
HEMOGLOBIN: 11.3 g/dL — AB (ref 13.0–17.0)
MCH: 28.4 pg (ref 26.0–34.0)
MCHC: 31 g/dL (ref 30.0–36.0)
MCV: 91.5 fL (ref 78.0–100.0)
Platelets: 157 10*3/uL (ref 150–400)
RBC: 3.98 MIL/uL — ABNORMAL LOW (ref 4.22–5.81)
RDW: 16.1 % — ABNORMAL HIGH (ref 11.5–15.5)
WBC: 10.6 10*3/uL — ABNORMAL HIGH (ref 4.0–10.5)

## 2016-05-12 LAB — EXPECTORATED SPUTUM ASSESSMENT W GRAM STAIN, RFLX TO RESP C

## 2016-05-12 LAB — PROTIME-INR
INR: 2.77 — AB (ref 0.00–1.49)
Prothrombin Time: 28.8 seconds — ABNORMAL HIGH (ref 11.6–15.2)

## 2016-05-12 LAB — BASIC METABOLIC PANEL
ANION GAP: 9 (ref 5–15)
BUN: 44 mg/dL — ABNORMAL HIGH (ref 6–20)
CHLORIDE: 102 mmol/L (ref 101–111)
CO2: 26 mmol/L (ref 22–32)
Calcium: 9.1 mg/dL (ref 8.9–10.3)
Creatinine, Ser: 1.64 mg/dL — ABNORMAL HIGH (ref 0.61–1.24)
GFR calc Af Amer: 45 mL/min — ABNORMAL LOW (ref >60)
GFR, EST NON AFRICAN AMERICAN: 39 mL/min — AB (ref >60)
GLUCOSE: 91 mg/dL (ref 65–99)
POTASSIUM: 4.3 mmol/L (ref 3.5–5.1)
Sodium: 137 mmol/L (ref 135–145)

## 2016-05-12 MED ORDER — ALPRAZOLAM 0.5 MG PO TABS: 0.5000 mg | ORAL_TABLET | ORAL | Status: DC

## 2016-05-12 MED ORDER — CARVEDILOL 3.125 MG PO TABS: 3.1250 mg | ORAL_TABLET | Freq: Two times a day (BID) | ORAL | Status: DC

## 2016-05-12 MED ORDER — FOLIC ACID 1 MG PO TABS: 1.0000 mg | ORAL_TABLET | Freq: Every day | ORAL | Status: DC

## 2016-05-12 MED ORDER — CEFUROXIME AXETIL 500 MG PO TABS: 500.0000 mg | ORAL_TABLET | Freq: Two times a day (BID) | ORAL | Status: DC

## 2016-05-12 MED ORDER — DIGOXIN 125 MCG PO TABS: 0.0625 mg | ORAL_TABLET | ORAL | Status: DC

## 2016-05-12 MED ORDER — CEFUROXIME AXETIL 500 MG PO TABS
500.0000 mg | ORAL_TABLET | Freq: Two times a day (BID) | ORAL | Status: DC
  Administered 2016-05-12: 500 mg via ORAL
  Filled 2016-05-12 (×3): qty 1

## 2016-05-12 NOTE — Progress Notes (Signed)
CM received call to ensure pt's discharge needs had been addressed.  CM notes family "forgot" to bring portable O2 tank for transport home.  CM called [facility] DME rep, Trey Paula to please deliver a loaner tank for pt who is on 4L for transport home; CM made RN to NOT send pt home without loaner tank; RN confirms understanding of O2 need.  No other CM needs were communicated.

## 2016-05-12 NOTE — Discharge Summary (Signed)
Physician Discharge Summary  [name] [name]  YNW:[id number]  DOB: [birth year]  DOA: 05/05/2016  PCP: Pamelia Hoit, MD  Admit date: 05/05/2016 Discharge date: 05/12/2016  Time spent: Greater than 30 minutes  Recommendations for Outpatient Follow-up:  1. Dr. Benedetto Goad, PCP in 5 days with repeat labs (CBC, BMP, PT & INR). 2. Recommend repeating chest x-ray in 3-4 weeks to ensure resolution of pneumonia findings. 3. Recommend repeating thyroid function tests in 4 weeks. 4. Dr. Peter [name], Cardiology 5. Coumadin clinic: Patient apparently has a follow-up appointment on 05/22/16. 6. Home health PT, OT, RN and aide.  Discharge Diagnoses:  Principal Problem:   CAP (community acquired pneumonia) Active Problems:   Essential hypertension   Atrial fibrillation (HCC)   Hypothyroidism (acquired)   Acute systolic congestive heart failure (HCC)   Discharge Condition: Improved & Stable  Diet recommendation: Heart healthy diet.  Filed Weights   05/10/16 0445 05/11/16 0525 05/12/16 0518  Weight: 58.06 kg (128 lb) 59.648 kg (131 lb 8 oz) 58.8 kg (129 lb 10.1 oz)    History of present illness:  75 year old male with a PMH of chronic systolic CHF, last EF 20-25 percent in June 2015, ICM, VT s/p ICD & PPM, stage III chronic kidney disease, chronic A. 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 Hypoxic in 70s with EMS. Pacemaker evaluation in ED and said to be OK. Admitted for acute on chronic hypoxic respiratory failure secondary to community-acquired pneumonia and decompensated CHF.  Hospital Course:   Community-acquired pneumonia, streptococcal - Empirically started on Rocephin and azithromycin. However overnight 5/30, developed worsening symptoms, repeat chest x-ray shows infiltrate in left lower lobe consistent with  pneumonia which is superimposed upon findings of COPD and chronic fibrosis. - Antibiotics were broadened to IV vancomycin and cefepime. - Blood cultures 2: Negative. Urine streptococcal antigen +. HIV screen negative. - Speech therapy evaluation appreciated and recommend regular diet and thin liquids. - DC vancomycin. Continued cefepime. - Today is day 8 of IV antibiotics. Clinically and radiologically improved. Transitioned to oral Ceftin at discharge to complete total 10 days antibiotic treatment. Recommend follow-up chest x-ray in 3-4 weeks to ensure resolution of pneumonia findings.  Acute on chronic systolic CHF/ICM with EF 20-25 percent by echo 05/2014, h/o VT s/p BiV ICD, CABG - Treated initially with IV Lasix. -3.98 L since admission. - Repeat 2-D echo shows LVEF 40-45 percent, improved. Severely elevated PA peak pressure. - Cardiology follow-up appreciated. Clinically improved. Now on oral Lasix. Seems compensated. - Decompensation attributed to pneumonia. Discussed with cardiology and cleared for discharge home. - V paced in the hospital. As per cardiology, no recent VT or ICD shocks. Discussed with cardiology today and recommend continuing current dose of amiodarone and defer decision regarding further management per EP cardiology at outpatient follow-up with Dr. Lewayne Bunting.  Acute on chronic respiratory failure with hypoxia - Patient has been on home oxygen 2 L/m continuously at home but as per family, does not keep it on all the time. - Overnight 5/30, decompensated and was on Ventimask. Improving. Titrate oxygen down as far as his saturations >88%. Incentive spirometry. - Chronic respiratory failure may be related to COPD and pulmonary fibrosis-? Related to amiodarone. - Oxygen has been titrated down to 3 L/m and maintaining saturations greater than 88%. Patient advised to continue this until follow-up with PCP  at which time can consider titrating down to prior baseline of 2 L/m. -  As per discussion with cardiology, patient to remain on amiodarone due to history of V. tach-this can be reassessed during follow-up with EP cardiology as outpatient.  Chronic atrial fibrillation - V paced on monitor. Digoxin level therapeutic at 1.5. Anticoagulated on Coumadin. Continue amiodarone and digoxin. - Carvedilol was reduced to 3.125 MG twice a day due to hypotension. Hypotension improved.  Hypothyroid - Continue levothyroxine at prior home dose. - TSH 11.646, free T4: 1.33 (mildly elevated), free T3: 1.5 (low). Unclear etiology of abnormal TFTs-most likely from amiodarone. Last TSH on 1/30:4.983. Probably sick euthyroid. Discussed with cardiology who recommended continuing current dose of Synthroid and outpatient follow-up.  Anxiety - When necessary Xanax.  Anemia - Stable. Started on folate for mild folate deficiency. Probably anemia of chronic disease.  Hypertension/hypotension - ? Chronic hypotension. Seems asymptomatic.  - Carvedilol dose was reduced. Blood pressures have improved and stabilized.  Sepsis due to community-acquired pneumonia - Sepsis physiology present on admission. Sepsis resolved..  Chronic kidney disease, stage 3-4 - Baseline creatinine may be in the 2 range. Last creatinine prior to admission: 1.94 on 01/09/2016. Creatinine improved to fluctuating in the hospital but stable. Follow BMP closely as outpatient.   Consultants:   Cardiology  Procedures:   2-D echo 05/08/16: Study Conclusions  - Left ventricle: The cavity size was normal. Systolic function was  mildly to moderately reduced. The estimated ejection fraction was  in the range of 40% to 45%. There is akinesis of the  apicalanteroseptal myocardium. - Mitral valve: Calcified annulus. There was mild regurgitation. - Left atrium: The atrium was mildly dilated. - Right atrium: The atrium was mildly dilated. - Pulmonary arteries: Systolic pressure was severely increased. PA  peak  pressure: 74 mm Hg (S).  Impressions:  - EF is improved when compared to prior ECHO.   Antimicrobials:   Azithromycin 1 dose on 5/27  IV Rocephin 5/27 > 5/30  IV cefepime 5/31 > 6/3  IV vancomycin 5/30 > 6/1  Oral Ceftin 6/3 > 6/5   Discharge Exam:  Complaints: Patient was seen along with cardiologist. States that his breathing is better than it has been for a long time. No coughing or chest pain. Anxious to go home.  Filed Vitals:   05/11/16 2045 05/12/16 0000 05/12/16 0518 05/12/16 0810  BP: 109/63 100/57 100/61 109/54  Pulse: 60 59 59 60  Temp: 97.8 F (36.6 C) 97.9 F (36.6 C) 98 F (36.7 C)   TempSrc:  Oral Oral   Resp: 21 21 19 25   Height:      Weight:   58.8 kg (129 lb 10.1 oz)   SpO2: 90% 92% 94% 92%    General exam: Pleasant elderly male sitting up comfortably in bed. Respiratory system: Improved breath sounds. Slightly diminished in left base. Rest of lung fields clear to auscultation. Respiratory effort normal. Cardiovascular system: S1 & S2 heard, RRR. No JVD, murmurs, rubs, gallops or clicks. No pedal edema. Telemetry: V paced rhythm in the 60s. Gastrointestinal system: Abdomen is nondistended, soft and nontender. No organomegaly or masses felt. Normal bowel sounds heard. Central nervous system: Alert and oriented. No focal neurological deficits. Extremities: Symmetric 5 x 5 power. Skin: No rashes, lesions or ulcers Psychiatry: Judgement and insight appear normal. Mood & affect appropriate.   Discharge Instructions      Discharge Instructions    (HEART FAILURE PATIENTS) Call MD:  Anytime you have any  of the following symptoms: 1) 3 pound weight gain in 24 hours or 5 pounds in 1 week 2) shortness of breath, with or without a dry hacking cough 3) swelling in the hands, feet or stomach 4) if you have to sleep on extra pillows at night in order to breathe.    Complete by:  As directed      Call MD for:  difficulty breathing, headache or visual  disturbances    Complete by:  As directed      Call MD for:  extreme fatigue    Complete by:  As directed      Call MD for:  persistant dizziness or light-headedness    Complete by:  As directed      Call MD for:  severe uncontrolled pain    Complete by:  As directed      Call MD for:  temperature >100.4    Complete by:  As directed      Diet - low sodium heart healthy    Complete by:  As directed      Discharge instructions    Complete by:  As directed   Please increase your home oxygen to 3 L/m via nasal cannula continuously until you are seen by your family doctor.     Increase activity slowly    Complete by:  As directed             Medication List    TAKE these medications        acetaminophen 500 MG tablet  Commonly known as:  TYLENOL  Take 1,000 mg by mouth every 6 (six) hours as needed (pain).     allopurinol 100 MG tablet  Commonly known as:  ZYLOPRIM  Take 1 tablet (100 mg total) by mouth daily.     ALPRAZolam 0.5 MG tablet  Commonly known as:  XANAX  Take 1 tablet (0.5 mg total) by mouth See admin instructions. Take 1 tablet (0.5 mg) by mouth twice daily, may also take 1 tablet in the afternoon as needed for anxiety     amiodarone 200 MG tablet  Commonly known as:  PACERONE  Take 1 tablet (200 mg total) by mouth daily.     calcitRIOL 0.25 MCG capsule  Commonly known as:  ROCALTROL  Take 1 capsule (0.25 mcg total) by mouth daily.     carvedilol 3.125 MG tablet  Commonly known as:  COREG  Take 1 tablet (3.125 mg total) by mouth 2 (two) times daily.     cefUROXime 500 MG tablet  Commonly known as:  CEFTIN  Take 1 tablet (500 mg total) by mouth 2 (two) times daily with a meal.     digoxin 0.125 MG tablet  Commonly known as:  LANOXIN  Take 0.5 tablets (0.0625 mg total) by mouth every other day. Take 1/2 tablet  every other day     folic acid 1 MG tablet  Commonly known as:  FOLVITE  Take 1 tablet (1 mg total) by mouth daily.     furosemide 40 MG  tablet  Commonly known as:  LASIX  Take 1 tablet (40 mg total) by mouth 2 (two) times daily.     levothyroxine 50 MCG tablet  Commonly known as:  SYNTHROID, LEVOTHROID  Take 1 tablet (50 mcg total) by mouth daily before breakfast.     loperamide 2 MG tablet  Commonly known as:  IMODIUM A-D  Take 2 mg by mouth 4 (four) times daily as needed  for diarrhea or loose stools.     lovastatin 40 MG tablet  Commonly known as:  MEVACOR  Take 1 tablet (40 mg total) by mouth at bedtime.     OVER THE COUNTER MEDICATION  Place 1 patch onto the skin daily as needed (pain). OTC pain patch     OXYGEN  Inhale 2 L into the lungs continuous.     potassium chloride 10 MEQ tablet  Commonly known as:  K-DUR  Take 1 tablet (10 mEq total) by mouth daily.     PRESERVISION AREDS 2 Caps  Take 1 capsule by mouth daily.     VITAMIN B-12 PO  Take 1 tablet by mouth daily.     warfarin 1 MG tablet  Commonly known as:  COUMADIN  Take as directed by coumadin clinic       Follow-up Information    Follow up with [facility].   Why:  Physical Therapy   Contact information:   [address] [place] [address] [phone]      Follow up with Pamelia Hoit, MD. Schedule an appointment as soon as possible for a visit in 5 days.   Specialty:  Family Medicine   Why:  To be seen with repeat labs (CBC, BMP, PT & INR). Recommend repeating chest x-ray in 3-4 weeks and thyroid function tests in 4 weeks.   Contact information:   [address] [place] [address] [phone]       Schedule an appointment as soon as possible for a visit with Peter [name], MD.   Specialty:  Cardiology   Contact information:   [address] [place] [address] [phone]       Get Medicines reviewed and adjusted: Please take all your medications with you for your next visit with your Primary MD  Please request your Primary MD to go over all hospital tests and  procedure/radiological results at the follow up. Please ask your Primary MD to get all Hospital records sent to his/her office.  If you experience worsening of your admission symptoms, develop shortness of breath, life threatening emergency, suicidal or homicidal thoughts you must seek medical attention immediately by calling 911 or calling your MD immediately if symptoms less severe.  You must read complete instructions/literature along with all the possible adverse reactions/side effects for all the Medicines you take and that have been prescribed to you. Take any new Medicines after you have completely understood and accept all the possible adverse reactions/side effects.   Do not drive when taking pain medications.   Do not take more than prescribed Pain, Sleep and Anxiety Medications  Special Instructions: If you have smoked or chewed Tobacco in the last 2 yrs please stop smoking, stop any regular Alcohol and or any Recreational drug use.  Wear Seat belts while driving.  Please note  You were cared for by a hospitalist during your hospital stay. Once you are discharged, your primary care physician will handle any further medical issues. Please note that NO REFILLS for any discharge medications will be authorized once you are discharged, as it is imperative that you return to your primary care physician (or establish a relationship with a primary care physician if you do not have one) for your aftercare needs so that they can reassess your need for medications and monitor your lab values.    The results of significant diagnostics from this hospitalization (including imaging, microbiology, ancillary and laboratory) are  listed below for reference.    Significant Diagnostic Studies: Dg Chest 2 View  05/12/2016  CLINICAL DATA:  Pneumonia EXAM: CHEST  2 VIEW COMPARISON:  05/09/2016 FINDINGS: Left AICD remains in place, unchanged. Prior CABG. Mild cardiomegaly. Diffuse interstitial prominence  likely reflects interstitial edema. Improving aeration at the right base. Residual left base atelectasis or infiltrate. IMPRESSION: Interstitial prominence may reflect mild interstitial edema. Resolution of the previously seen right basilar opacity with continued left base atelectasis or infiltrate. Electronically Signed   By: Charlett Nose M.D.   On: 05/12/2016 10:08   Dg Chest 2 View  05/08/2016  CLINICAL DATA:  Chest congestion shortness of breath and weakness; former smoker EXAM: CHEST  2 VIEW COMPARISON:  PA and lateral chest x-ray of May 05, 2016 FINDINGS: The lungs are well-expanded. There is confluent density in the left lower lobe more conspicuous than on the previous study. Small amounts of pleural fluid are present bilaterally blunting the costophrenic angles. The interstitial markings elsewhere in both lungs remain mildly increased. The cardiac silhouette is top-normal in size. The permanent pacemaker defibrillator is in stable position. There are post CABG changes. The bony thorax exhibits no acute abnormality. IMPRESSION: Infiltrate in the left lower lobe posteriorly consistent with pneumonia. This is superimposed upon findings of COPD and chronic fibrosis. No CHF. Electronically Signed   By: David  [name] M.D.   On: 05/08/2016 09:20   Dg Chest 2 View  05/05/2016  CLINICAL DATA:  Increasing shortness of Breath EXAM: CHEST  2 VIEW COMPARISON:  01/24/2013 FINDINGS: Cardiac shadow is stable. Defibrillator is again noted and stable. Lungs are well aerated bilaterally. Diffuse interstitial changes are seen. Additionally increased density is noted in the bases bilaterally consistent with some acute on chronic infiltrate. No bony abnormality is seen. Postsurgical changes are noted. IMPRESSION: Acute on chronic bibasilar infiltrate. Electronically Signed   By: Alcide Clever M.D.   On: 05/05/2016 21:03   Dg Chest Port 1 View  05/09/2016  CLINICAL DATA:  Hypoxia. EXAM: PORTABLE CHEST 1 VIEW COMPARISON:   Frontal and lateral views 05/08/2016, 05/05/2016 FINDINGS: Multi lead left-sided pacemaker remains in place. Patient is post median sternotomy. Mediastinal contours are unchanged, heart at the upper limits normal in size. Worsening right basilar opacity with developing patchy density. Increasing patchy left basilar opacity. Chronic bronchitic changes are again seen small bilateral pleural effusions suspected. IMPRESSION: Worsening left and developing right basilar opacities concerning for worsening and pneumonia. Given distribution, aspiration is considered. Suspect small pleural effusions. Electronically Signed   By: Rubye Oaks M.D.   On: 05/09/2016 03:06    Microbiology: Recent Results (from the past 240 hour(s))  Blood culture (routine x 2)     Status: None   Collection Time: 05/05/16 10:36 PM  Result Value Ref Range Status   Specimen Description BLOOD RIGHT ANTECUBITAL  Final   Special Requests IN PEDIATRIC BOTTLE 4CC  Final   Culture NO GROWTH 5 DAYS  Final   Report Status 05/11/2016 FINAL  Final  Blood culture (routine x 2)     Status: None   Collection Time: 05/05/16 10:41 PM  Result Value Ref Range Status   Specimen Description BLOOD RIGHT HAND  Final   Special Requests BOTTLES DRAWN AEROBIC AND ANAEROBIC 5CC   Final   Culture NO GROWTH 5 DAYS  Final   Report Status 05/11/2016 FINAL  Final  MRSA PCR Screening     Status: None   Collection Time: 05/06/16  1:49 AM  Result  Value Ref Range Status   MRSA by PCR NEGATIVE NEGATIVE Final    Comment:        The GeneXpert MRSA Assay (FDA approved for NASAL specimens only), is one component of a comprehensive MRSA colonization surveillance program. It is not intended to diagnose MRSA infection nor to guide or monitor treatment for MRSA infections.   Culture, sputum-assessment     Status: None   Collection Time: 05/11/16  7:44 PM  Result Value Ref Range Status   Specimen Description SPUTUM  Final   Special Requests NONE  Final    Sputum evaluation   Final    THIS SPECIMEN IS ACCEPTABLE. RESPIRATORY CULTURE REPORT TO FOLLOW.   Report Status 05/12/2016 FINAL  Final  Culture, respiratory (NON-Expectorated)     Status: None (Preliminary result)   Collection Time: 05/11/16  7:44 PM  Result Value Ref Range Status   Specimen Description EXPECTORATED SPUTUM  Final   Special Requests NONE  Final   Gram Stain   Final    RARE WBC PRESENT,BOTH PMN AND MONONUCLEAR RARE YEAST    Culture PENDING  Incomplete   Report Status PENDING  Incomplete     Labs: Basic Metabolic Panel:  Recent Labs Lab 05/08/16 0507 05/09/16 0443 05/10/16 0400 05/11/16 0413 05/12/16 0333  NA 134* 135 135 138 137  K 4.0 3.9 4.4 4.4 4.3  CL 103 102 103 104 102  CO2 21* 24 25 25 26   GLUCOSE 93 92 91 85 91  BUN 45* 44* 44* 44* 44*  CREATININE 1.89* 2.00* 1.69* 1.81* 1.64*  CALCIUM 8.5* 8.6* 8.8* 9.0 9.1   Liver Function Tests:  Recent Labs Lab 05/06/16 1445 05/11/16 0413  AST 29 41  ALT 14* 14*  ALKPHOS 129* 137*  BILITOT 1.0 0.9  PROT 6.2* 5.9*  ALBUMIN 2.2* 2.0*   No results for input(s): LIPASE, AMYLASE in the last 168 hours. No results for input(s): AMMONIA in the last 168 hours. CBC:  Recent Labs Lab 05/05/16 2040 05/06/16 0311 05/07/16 0142 05/12/16 0333  WBC 13.7* 13.8* 7.8 10.6*  NEUTROABS  --  10.8*  --   --   HGB 10.9* 10.8* 10.1* 11.3*  HCT 35.4* 34.3* 32.2* 36.4*  MCV 91.5 91.7 90.4 91.5  PLT 158 163 151 157   Cardiac Enzymes: No results for input(s): CKTOTAL, CKMB, CKMBINDEX, TROPONINI in the last 168 hours. BNP: BNP (last 3 results)  Recent Labs  05/05/16 2040  BNP 806.5*    ProBNP (last 3 results) No results for input(s): PROBNP in the last 8760 hours.  CBG: No results for input(s): GLUCAP in the last 168 hours.     Signed:  Marcellus Scott, MD, FACP, FHM. [facility] Pager [phone]  If 7PM-7AM, please contact night-coverage [email or web address] Password [email or web address] 05/12/2016, 11:24  AM

## 2016-05-12 NOTE — Discharge Instructions (Addendum)
Information on my medicine - Coumadin   (Warfarin)  This medication education was reviewed with me or my healthcare representative as part of my discharge preparation Why was Coumadin prescribed for you? Coumadin was prescribed for you because you have a blood clot or a medical condition that can cause an increased risk of forming blood clots. Blood clots can cause serious health problems by blocking the flow of blood to the heart, lung, or brain. Coumadin can prevent harmful blood clots from forming. As a reminder your indication for Coumadin is:   Stroke Prevention Because Of Atrial Fibrillation  What test will check on my response to Coumadin? While on Coumadin (warfarin) you will need to have an INR test regularly to ensure that your dose is keeping you in the desired range. The INR (international normalized ratio) number is calculated from the result of the laboratory test called prothrombin time (PT).  If an INR APPOINTMENT HAS NOT ALREADY BEEN MADE FOR YOU please schedule an appointment to have this lab work done by your health care provider within 7 days. Your INR goal is usually a number between:  2 to 3 What  do you need to  know  About  COUMADIN? Take Coumadin (warfarin) exactly as prescribed by your healthcare provider about the same time each day.  DO NOT stop taking without talking to the doctor who prescribed the medication.  Stopping without other blood clot prevention medication to take the place of Coumadin may increase your risk of developing a new clot or stroke.  Get refills before you run out.  What do you do if you miss a dose? If you miss a dose, take it as soon as you remember on the same day then continue your regularly scheduled regimen the next day.  Do not take two doses of Coumadin at the same time.  Important Safety Information A possible side effect of Coumadin (Warfarin) is an increased risk of bleeding. You should call your healthcare provider right away if you  experience any of the following: ? Bleeding from an injury or your nose that does not stop. ? Unusual colored urine (red or dark brown) or unusual colored stools (red or black). ? Unusual bruising for unknown reasons. ? A serious fall or if you hit your head (even if there is no bleeding).  Some foods or medicines interact with Coumadin (warfarin) and might alter your response to warfarin. To help avoid this: ? Eat a balanced diet, maintaining a consistent amount of Vitamin K. ? Notify your provider about major diet changes you plan to make. ? Avoid alcohol or limit your intake to 1 drink for women and 2 drinks for men per day. (1 drink is 5 oz. wine, 12 oz. beer, or 1.5 oz. liquor.)  Make sure that ANY health care provider who prescribes medication for you knows that you are taking Coumadin (warfarin).  Also make sure the healthcare provider who is monitoring your Coumadin knows when you have started a new medication including herbals and non-prescription products.  Coumadin (Warfarin)  Major Drug Interactions  Increased Warfarin Effect Decreased Warfarin Effect  Alcohol (large quantities) Antibiotics (esp. Septra/Bactrim, Flagyl, Cipro) Amiodarone (Cordarone) Aspirin (ASA) Cimetidine (Tagamet) Megestrol (Megace) NSAIDs (ibuprofen, naproxen, etc.) Piroxicam (Feldene) Propafenone (Rythmol SR) Propranolol (Inderal) Isoniazid (INH) Posaconazole (Noxafil) Barbiturates (Phenobarbital) Carbamazepine (Tegretol) Chlordiazepoxide (Librium) Cholestyramine (Questran) Griseofulvin Oral Contraceptives Rifampin Sucralfate (Carafate) Vitamin K   Coumadin (Warfarin) Major Herbal Interactions  Increased Warfarin Effect Decreased Warfarin Effect  Garlic Ginseng  Ginkgo biloba Coenzyme Q10 Green tea St. Johns wort    Coumadin (Warfarin) FOOD Interactions  Eat a consistent number of servings per week of foods HIGH in Vitamin K (1 serving =  cup)  Collards (cooked, or boiled &  drained) Kale (cooked, or boiled & drained) Mustard greens (cooked, or boiled & drained) Parsley *serving size only =  cup Spinach (cooked, or boiled & drained) Swiss chard (cooked, or boiled & drained) Turnip greens (cooked, or boiled & drained)  Eat a consistent number of servings per week of foods MEDIUM-HIGH in Vitamin K (1 serving = 1 cup)  Asparagus (cooked, or boiled & drained) Broccoli (cooked, boiled & drained, or raw & chopped) Brussel sprouts (cooked, or boiled & drained) *serving size only =  cup Lettuce, raw (green leaf, endive, romaine) Spinach, raw Turnip greens, raw & chopped   These websites have more information on Coumadin (warfarin):  http://www.king-russell.com/; https://www.hines.net/;  Community-Acquired Pneumonia, Adult Pneumonia is an infection of the lungs. There are different types of pneumonia. One type can develop while a person is in a hospital. A different type, called community-acquired pneumonia, develops in people who are not, or have not recently been, in the hospital or other health care facility.  CAUSES Pneumonia may be caused by bacteria, viruses, or funguses. Community-acquired pneumonia is often caused by Streptococcus pneumonia bacteria. These bacteria are often passed from one person to another by breathing in droplets from the cough or sneeze of an infected person. RISK FACTORS The condition is more likely to develop in:  People who havechronic diseases, such as chronic obstructive pulmonary disease (COPD), asthma, congestive heart failure, cystic fibrosis, diabetes, or kidney disease.  People who haveearly-stage or late-stage HIV.  People who havesickle cell disease.  People who havehad their spleen removed (splenectomy).  People who havepoor Administrator.  People who havemedical conditions that increase the risk of breathing in (aspirating) secretions their own mouth and nose.   People who havea weakened immune system  (immunocompromised).  People who smoke.  People whotravel to areas where pneumonia-causing germs commonly exist.  People whoare around animal habitats or animals that have pneumonia-causing germs, including birds, bats, rabbits, cats, and farm animals. SYMPTOMS Symptoms of this condition include:  Adry cough.  A wet (productive) cough.  Fever.  Sweating.  Chest pain, especially when breathing deeply or coughing.  Rapid breathing or difficulty breathing.  Shortness of breath.  Shaking chills.  Fatigue.  Muscle aches. DIAGNOSIS Your health care provider will take a medical history and perform a physical exam. You may also have other tests, including:  Imaging studies of your chest, including X-rays.  Tests to check your blood oxygen level and other blood gases.  Other tests on blood, mucus (sputum), fluid around your lungs (pleural fluid), and urine. If your pneumonia is severe, other tests may be done to identify the specific cause of your illness. TREATMENT The type of treatment that you receive depends on many factors, such as the cause of your pneumonia, the medicines you take, and other medical conditions that you have. For most adults, treatment and recovery from pneumonia may occur at home. In some cases, treatment must happen in a hospital. Treatment may include:  Antibiotic medicines, if the pneumonia was caused by bacteria.  Antiviral medicines, if the pneumonia was caused by a virus.  Medicines that are given by mouth or through an IV tube.  Oxygen.  Respiratory therapy. Although rare, treating severe pneumonia may include:  Mechanical ventilation. This  is done if you are not breathing well on your own and you cannot maintain a safe blood oxygen level.  Thoracentesis. This procedureremoves fluid around one lung or both lungs to help you breathe better. HOME CARE INSTRUCTIONS  Take over-the-counter and prescription medicines only as told by your  health care provider.  Only takecough medicine if you are losing sleep. Understand that cough medicine can prevent your body's natural ability to remove mucus from your lungs.  If you were prescribed an antibiotic medicine, take it as told by your health care provider. Do not stop taking the antibiotic even if you start to feel better.  Sleep in a semi-upright position at night. Try sleeping in a reclining chair, or place a few pillows under your head.  Do not use tobacco products, including cigarettes, chewing tobacco, and e-cigarettes. If you need help quitting, ask your health care provider.  Drink enough water to keep your urine clear or pale yellow. This will help to thin out mucus secretions in your lungs. PREVENTION There are ways that you can decrease your risk of developing community-acquired pneumonia. Consider getting a pneumococcal vaccine if:  You are older than 77 years of age.  You are older than 77 years of age and are undergoing cancer treatment, have chronic lung disease, or have other medical conditions that affect your immune system. Ask your health care provider if this applies to you. There are different types and schedules of pneumococcal vaccines. Ask your health care provider which vaccination option is best for you. You may also prevent community-acquired pneumonia if you take these actions:  Get an influenza vaccine every year. Ask your health care provider which type of influenza vaccine is best for you.  Go to the dentist on a regular basis.  Wash your hands often. Use hand sanitizer if soap and water are not available. SEEK MEDICAL CARE IF:  You have a fever.  You are losing sleep because you cannot control your cough with cough medicine. SEEK IMMEDIATE MEDICAL CARE IF:  You have worsening shortness of breath.  You have increased chest pain.  Your sickness becomes worse, especially if you are an older adult or have a weakened immune system.  You  cough up blood.   This information is not intended to replace advice given to you by your health care provider. Make sure you discuss any questions you have with your health care provider.   Document Released: 11/26/2005 Document Revised: 08/17/2015 Document Reviewed: 03/23/2015 Elsevier Interactive Patient Education [employer]

## 2016-05-12 NOTE — Progress Notes (Signed)
Patient Name: [name] [name] Trim Date of Encounter: 05/12/2016  Principal Problem:   CAP (community acquired pneumonia) Active Problems:   Essential hypertension   Atrial fibrillation (HCC)   Hypothyroidism (acquired)   Acute systolic congestive heart failure (HCC)   Length of Stay: 6  SUBJECTIVE  Breathing is better than it has been in a long time. No cough or "rattling" and eager to go home. On O2 at 3-4 L/min  CURRENT MEDS . ALPRAZolam  0.5 mg Oral BID  . amiodarone  200 mg Oral Daily  . antiseptic oral rinse  7 mL Mouth Rinse BID  . calcitRIOL  0.25 mcg Oral Daily  . carvedilol  3.125 mg Oral BID  . ceFEPime (MAXIPIME) IV  1 g Intravenous Daily  . digoxin  0.0625 mg Oral QODAY  . folic acid  1 mg Oral Daily  . furosemide  40 mg Oral BID  . guaiFENesin  600 mg Oral BID  . ipratropium-albuterol  3 mL Nebulization Once  . levothyroxine  50 mcg Oral QAC breakfast  . multivitamin  1 tablet Oral Daily  . potassium chloride  10 mEq Oral Daily  . pravastatin  40 mg Oral q1800  . sodium chloride flush  3 mL Intravenous Q12H  . warfarin  0.5 mg Oral q1800  . Warfarin - Pharmacist Dosing Inpatient   Does not apply q1800    OBJECTIVE   Intake/Output Summary (Last 24 hours) at 05/12/16 0850 Last data filed at 05/12/16 0519  Gross per 24 hour  Intake    810 ml  Output   1525 ml  Net   -715 ml   Filed Weights   05/10/16 0445 05/11/16 0525 05/12/16 0518  Weight: 58.06 kg (128 lb) 59.648 kg (131 lb 8 oz) 58.8 kg (129 lb 10.1 oz)    PHYSICAL EXAM Filed Vitals:   05/11/16 2045 05/12/16 0000 05/12/16 0518 05/12/16 0810  BP: 109/63 100/57 100/61 109/54  Pulse: 60 59 59 60  Temp: 97.8 F (36.6 C) 97.9 F (36.6 C) 98 F (36.7 C)   TempSrc:  Oral Oral   Resp: 21 21 19 25   Height:      Weight:   58.8 kg (129 lb 10.1 oz)   SpO2: 90% 92% 94% 92%   General: Alert, oriented x3, no distress, frail and cachectic Head: no evidence of trauma, PERRL, EOMI, no exophtalmos or lid  lag, no myxedema, no xanthelasma; normal ears, nose and oropharynx Neck: 5-7 cm jugular venous pulsations with mild hepatojugular reflux and prominent v waves; brisk carotid pulses without delay and no carotid bruits Chest: reduced breath sounds in left base, distant breath sounds throughout, but otherwise clear to auscultation, no signs of consolidation by percussion or palpation, normal fremitus, symmetrical and full respiratory excursions. Healthy L subclavian device site Cardiovascular: normal position and quality of the apical impulse, regular rhythm, normal first and second heart sounds, no rubs or gallops, 2/6 holosystolic LLSB and apical murmur Abdomen: no tenderness or distention, no masses by palpation, no abnormal pulsatility or arterial bruits, normal bowel sounds, no hepatosplenomegaly Extremities: no clubbing, cyanosis or edema; 2+ radial, ulnar and brachial pulses bilaterally; 2+ right femoral, posterior tibial and dorsalis pedis pulses; 2+ left femoral, posterior tibial and dorsalis pedis pulses; no subclavian or femoral bruits Neurological: grossly nonfocal  LABS  CBC  Recent Labs  05/12/16 0333  WBC 10.6*  HGB 11.3*  HCT 36.4*  MCV 91.5  PLT 157   Basic Metabolic Panel  Recent Labs  05/11/16 0413 05/12/16 0333  NA 138 137  K 4.4 4.3  CL 104 102  CO2 25 26  GLUCOSE 85 91  BUN 44* 44*  CREATININE 1.81* 1.64*  CALCIUM 9.0 9.1   Liver Function Tests  Recent Labs  05/11/16 0413  AST 41  ALT 14*  ALKPHOS 137*  BILITOT 0.9  PROT 5.9*  ALBUMIN 2.0*    Radiology Studies Imaging results have been reviewed   TELE 100% A sense BiV paced, no VT    ASSESSMENT AND PLAN  1. Acute on chronic systolic HF/ ICM with baseline EF 20-25% by echo 05/2014 - s/p IV diuresis, weight down from 135 down to 129 lbs. I/O -4 L. Probably close to euvolemia. Weighed 137 at office visits in March and April, seems he has lost real weight since. - echo shows severely  elevated PA peak pressure , EF 40-45%. Compare to previous echo in 2015, EF has improved from 20-25% - coreg decreased to 3.125 BID  - decompensation attributable to pneumonia  2. Strep pneumonia: switching to PO antibiotics today.Currently on nasal cannula 3L, usually 2L at home. The elevated   3. Severe pulmonary artery HTN: PA peak pressure of may indicate pulmonary issues as well as HF, hopefully will improve once acute pulmonary issues improve. Would reevaluate sPAP by limited echo when he is fully over the acute illness and euvolemic. Needs to wear O2 all the time.  4. H/o VT s/p BiV ICD-V pacing. No recent VT or ICD shocks. Follows with Dr. Ladona Ridgel.  5. CAD s/p CABGx3 2001 (LIMA to LAD, SVG to OM, SVG to PDA), no angina  6. Chronic atrial fibrillation, rate controlled, on coumadin INR stable in therapeutic range  7. Hypothyroidism on synthroid 50 mcg with TSH at 11, Free T4 high at 1.33. Likely a component of euthyroid sick syndrome. No clinical signs of thyrotoxicosis except low weight. Keep on synthroid. Reevaluate in a few weeks.  8. Acute on CKD stage III - Cr improving with diuresis, seems to be at baseline   Thurmon Fair, MD, [facility] [phone] office [phone] pager 05/12/2016 8:50 AM

## 2016-05-12 NOTE — Progress Notes (Signed)
ANTICOAGULATION CONSULT NOTE - Follow Up Consult  Pharmacy Consult for Coumadin Indication: atrial fibrillation  Allergies  Allergen Reactions  . Nitroglycerin Other (See Comments)    Became severely hypotensive  . Ace Inhibitors Other (See Comments)    Listed as intolerance - Unknown Reaction per previous records.    Patient Measurements: Height: 5\' 5"  (165.1 cm) Weight: 129 lb 10.1 oz (58.8 kg) IBW/kg (Calculated) : 61.5  Vital Signs: Temp: 98 F (36.7 C) (06/03 0518) Temp Source: Oral (06/03 0518) BP: 109/54 mmHg (06/03 0810) Pulse Rate: 60 (06/03 0810)  Labs:  Recent Labs  05/10/16 0400 05/11/16 0413 05/12/16 0333  HGB  --   --  11.3*  HCT  --   --  36.4*  PLT  --   --  157  LABPROT 28.5* 29.4* 28.8*  INR 2.73* 2.84* 2.77*  CREATININE 1.69* 1.81* 1.64*    Estimated Creatinine Clearance: 31.4 mL/min (by C-G formula based on Cr of 1.64).   Medications:  Scheduled:  . ALPRAZolam  0.5 mg Oral BID  . amiodarone  200 mg Oral Daily  . antiseptic oral rinse  7 mL Mouth Rinse BID  . calcitRIOL  0.25 mcg Oral Daily  . carvedilol  3.125 mg Oral BID  . cefUROXime  500 mg Oral BID WC  . digoxin  0.0625 mg Oral QODAY  . folic acid  1 mg Oral Daily  . furosemide  40 mg Oral BID  . guaiFENesin  600 mg Oral BID  . ipratropium-albuterol  3 mL Nebulization Once  . levothyroxine  50 mcg Oral QAC breakfast  . multivitamin  1 tablet Oral Daily  . potassium chloride  10 mEq Oral Daily  . pravastatin  40 mg Oral q1800  . sodium chloride flush  3 mL Intravenous Q12H  . warfarin  0.5 mg Oral q1800  . Warfarin - Pharmacist Dosing Inpatient   Does not apply q1800    Assessment: 75 yo M admitted with CAP and HF exacerbation.  Pt on Coumadin PTA for hx of afib.  INR 2.77 is therapeutic on home regimen. Will continue. Switching ABX  to po Ceftin today encourage CVRR appt this week.    Goal of Therapy:  INR 2-3 Monitor platelets by anticoagulation protocol: Yes   Plan:   Coumadin 0.5mg  PO daily Continue daily INR for now.  Leota Sauers Pharm.D. CPP, BCPS Clinical Pharmacist [phone] 05/12/2016 9:49 AM

## 2016-05-16 ENCOUNTER — Telehealth: Payer: Self-pay | Admitting: Cardiology

## 2016-05-16 LAB — CULTURE, RESPIRATORY

## 2016-05-16 LAB — CULTURE, RESPIRATORY W GRAM STAIN

## 2016-05-16 NOTE — Telephone Encounter (Signed)
New message      Has pt every been on an ACE or ARB medication?  If yes, why was it stopped?

## 2016-05-16 NOTE — Telephone Encounter (Signed)
Returned call and left msg for caller - noted Dr. Elvis Coil concern for renal insufficiency as reason patient not currently prescribed ACE or ARB.

## 2016-05-22 ENCOUNTER — Encounter: Payer: Medicare Other | Admitting: Physician Assistant

## 2016-05-22 ENCOUNTER — Ambulatory Visit (INDEPENDENT_AMBULATORY_CARE_PROVIDER_SITE_OTHER): Payer: Medicare Other | Admitting: *Deleted

## 2016-05-22 DIAGNOSIS — I4891 Unspecified atrial fibrillation: Secondary | ICD-10-CM

## 2016-05-22 DIAGNOSIS — I6350 Cerebral infarction due to unspecified occlusion or stenosis of unspecified cerebral artery: Secondary | ICD-10-CM | POA: Diagnosis not present

## 2016-05-22 DIAGNOSIS — Z7901 Long term (current) use of anticoagulants: Secondary | ICD-10-CM

## 2016-05-22 DIAGNOSIS — I482 Chronic atrial fibrillation, unspecified: Secondary | ICD-10-CM

## 2016-05-22 DIAGNOSIS — Z5181 Encounter for therapeutic drug level monitoring: Secondary | ICD-10-CM | POA: Diagnosis not present

## 2016-05-22 LAB — POCT INR: INR: 3.1

## 2016-05-22 NOTE — Assessment & Plan Note (Signed)
No show

## 2016-05-22 NOTE — Progress Notes (Signed)
Cardiology Office Note    Date:  05/22/2016   ID:  Melton, Fineran 1941-07-08, MRN [id number]  PCP:  Pamelia Hoit, MD  Cardiologist: Dr. [name]   No chief complaint on file.   History of Present Illness:  No show   Past Medical History  Diagnosis Date  . Coronary artery disease   . Cardiomyopathy, ischemic     EF of 30-35%  . Status post CVA   . VT (ventricular tachycardia) (HCC) aug of 2010    removed old ICD and replaced with biventricular ICD  . CKD (chronic kidney disease) stage 3, GFR 30-59 ml/min   . Atrial fibrillation (HCC)     chronic  . Hypertension   . Dyslipidemia   . Peripheral vascular disease (HCC)   . Thyroid disease     hypo,due to amiodarone therapy,resolved  . Myocardial infarction [facility]) January 2001     History of anteroseptal myocardial infarction presenting with left bundle-branch block  . Anxiety   . Tobacco abuse     History of 2 pack per day tobacco habituation  . Pacemaker   . CHF (congestive heart failure) [facility])     Past Surgical History  Procedure Laterality Date  . Cholecystectomy    . Femoral bypass      fem-fem bpg, right fem pop bpg  . Icd  07/15/2009    implant Dr. Ladona Ridgel  . Coronary artery bypass graft  2001    x 4  . Cardiac catheterization  2001    PTCA  . Cardiac catheterization  2005    patent grafts  . Cataracts  2006    right eye  . Implantable cardioverter defibrillator (icd) generator change N/A 11/28/2012    Procedure: ICD GENERATOR CHANGE;  Surgeon: Marinus Maw, MD;  Location: [facility] CATH LAB;  Service: Cardiovascular;  Laterality: N/A;    Current Medications: Outpatient Prescriptions Prior to Visit  Medication Sig Dispense Refill  . acetaminophen (TYLENOL) 500 MG tablet Take 1,000 mg by mouth every 6 (six) hours as needed (pain).    Marland Kitchen allopurinol (ZYLOPRIM) 100 MG tablet Take 1 tablet (100 mg total) by mouth daily. 90 tablet 3  . ALPRAZolam (XANAX) 0.5 MG tablet Take 1 tablet (0.5 mg total) by  mouth See admin instructions. Take 1 tablet (0.5 mg) by mouth twice daily, may also take 1 tablet in the afternoon as needed for anxiety    . amiodarone (PACERONE) 200 MG tablet Take 1 tablet (200 mg total) by mouth daily. 90 tablet 3  . calcitRIOL (ROCALTROL) 0.25 MCG capsule Take 1 capsule (0.25 mcg total) by mouth daily. 90 capsule 1  . carvedilol (COREG) 3.125 MG tablet Take 1 tablet (3.125 mg total) by mouth 2 (two) times daily. 60 tablet 0  . cefUROXime (CEFTIN) 500 MG tablet Take 1 tablet (500 mg total) by mouth 2 (two) times daily with a meal. 5 tablet 0  . Cyanocobalamin (VITAMIN B-12 PO) Take 1 tablet by mouth daily.    . digoxin (LANOXIN) 0.125 MG tablet Take 0.5 tablets (0.0625 mg total) by mouth every other day. Take 1/2 tablet  every other day    . folic acid (FOLVITE) 1 MG tablet Take 1 tablet (1 mg total) by mouth daily. 30 tablet 0  . furosemide (LASIX) 40 MG tablet Take 1 tablet (40 mg total) by mouth 2 (two) times daily. 180 tablet 3  . levothyroxine (SYNTHROID, LEVOTHROID) 50 MCG tablet Take 1 tablet (50 mcg total) by mouth  daily before breakfast. 90 tablet 3  . loperamide (IMODIUM A-D) 2 MG tablet Take 2 mg by mouth 4 (four) times daily as needed for diarrhea or loose stools.    . lovastatin (MEVACOR) 40 MG tablet Take 1 tablet (40 mg total) by mouth at bedtime. 90 tablet 3  . Multiple Vitamins-Minerals (PRESERVISION AREDS 2) CAPS Take 1 capsule by mouth daily.     Marland Kitchen OVER THE COUNTER MEDICATION Place 1 patch onto the skin daily as needed (pain). OTC pain patch    . OXYGEN Inhale 2 L into the lungs continuous.    . potassium chloride (K-DUR) 10 MEQ tablet Take 1 tablet (10 mEq total) by mouth daily. 90 tablet 3  . warfarin (COUMADIN) 1 MG tablet Take as directed by coumadin clinic (Patient taking differently: Take 0.5 mg by mouth daily. or as directed by coumadin clinic) 90 tablet 0   No facility-administered medications prior to visit.     Allergies:   Nitroglycerin and Ace  inhibitors   Social History   Social History  . Marital Status: Married    Spouse Name: N/A  . Number of Children: 2  . Years of Education: N/A   Occupational History  . Psychologist, prison and probation services    Social History Main Topics  . Smoking status: Former Smoker    Quit date: 09/30/2000  . Smokeless tobacco: Not on file  . Alcohol Use: Not on file  . Drug Use: Not on file  . Sexual Activity: Not on file   Other Topics Concern  . Not on file   Social History Narrative     Family History:  The patient's    family history includes Aneurysm in his brother; Heart disease in his mother; Stroke in his father and mother.   ROS:   Please see the history of present illness.    ROS All other systems reviewed and are negative.   PHYSICAL EXAM:   VS:  There were no vitals taken for this visit.   GEN: Well nourished, well developed, in no acute distress HEENT: normal Neck: no JVD, carotid bruits, or masses Cardiac:  RRR; no murmurs, rubs, or gallops,no edema  Respiratory:  clear to auscultation bilaterally, normal work of breathing GI: soft, nontender, nondistended, + BS MS: no deformity or atrophy Skin: warm and dry, no rash Neuro:  Alert and Oriented x 3, Strength and sensation are intact Psych: euthymic mood, full affect  Wt Readings from Last 3 Encounters:  05/12/16 129 lb 10.1 oz (58.8 kg)  04/05/16 137 lb 6.4 oz (62.324 kg)  03/09/16 137 lb 12.8 oz (62.506 kg)      Studies/Labs Reviewed:   EKG:  EKG is ordered today.  The ekg ordered today demonstrates    Recent Labs: 05/05/2016: B Natriuretic Peptide 806.5* 05/06/2016: TSH 11.646* 05/11/2016: ALT 14* 05/12/2016: BUN 44*; Creatinine, Ser 1.64*; Hemoglobin 11.3*; Platelets 157; Potassium 4.3; Sodium 137   Lipid Panel    Component Value Date/Time   CHOL 104* 07/11/2015 1024   TRIG 173* 07/11/2015 1024   HDL 32* 07/11/2015 1024   CHOLHDL 3.3 07/11/2015 1024   VLDL 35* 07/11/2015 1024   LDLCALC 37 07/11/2015 1024     Additional studies/ records that were reviewed today include:  2-D echo 5/30//2017Study Conclusions   - Left ventricle: The cavity size was normal. Systolic function was   mildly to moderately reduced. The estimated ejection fraction was   in the range of 40% to 45%. There is akinesis of the  apicalanteroseptal myocardium. - Mitral valve: Calcified annulus. There was mild regurgitation. - Left atrium: The atrium was mildly dilated. - Right atrium: The atrium was mildly dilated. - Pulmonary arteries: Systolic pressure was severely increased. PA   peak pressure: 74 mm Hg (S).   Impressions:   - EF is improved when compared to prior ECHO.       ASSESSMENT:    No diagnosis found.   PLAN:  In order of problems listed above:      Medication Adjustments/Labs and Tests Ordered: Current medicines are reviewed at length with the patient today.  Concerns regarding medicines are outlined above.  Medication changes, Labs and Tests ordered today are listed in the Patient Instructions below. There are no Patient Instructions on file for this visit.   Elson Clan, PA-C  05/22/2016 12:42 PM    [facility] [address] [place], [place], [place]  [address] Phone: [phone]; Fax: [phone]

## 2016-06-02 ENCOUNTER — Telehealth: Payer: Self-pay | Admitting: Physician Assistant

## 2016-06-02 ENCOUNTER — Emergency Department (HOSPITAL_COMMUNITY): Payer: Medicare Other

## 2016-06-02 ENCOUNTER — Emergency Department (HOSPITAL_COMMUNITY)
Admission: EM | Admit: 2016-06-02 | Discharge: 2016-06-02 | Disposition: A | Discharge status: Home / Self Care | Payer: Medicare Other | Attending: Emergency Medicine | Admitting: Emergency Medicine

## 2016-06-02 ENCOUNTER — Encounter (HOSPITAL_COMMUNITY): Payer: Self-pay

## 2016-06-02 DIAGNOSIS — N183 Chronic kidney disease, stage 3 (moderate): Secondary | ICD-10-CM | POA: Diagnosis not present

## 2016-06-02 DIAGNOSIS — Z7901 Long term (current) use of anticoagulants: Secondary | ICD-10-CM | POA: Diagnosis not present

## 2016-06-02 DIAGNOSIS — Z950 Presence of cardiac pacemaker: Secondary | ICD-10-CM | POA: Diagnosis not present

## 2016-06-02 DIAGNOSIS — W19XXXA Unspecified fall, initial encounter: Secondary | ICD-10-CM

## 2016-06-02 DIAGNOSIS — I252 Old myocardial infarction: Secondary | ICD-10-CM | POA: Diagnosis not present

## 2016-06-02 DIAGNOSIS — Y929 Unspecified place or not applicable: Secondary | ICD-10-CM | POA: Insufficient documentation

## 2016-06-02 DIAGNOSIS — I2510 Atherosclerotic heart disease of native coronary artery without angina pectoris: Secondary | ICD-10-CM | POA: Insufficient documentation

## 2016-06-02 DIAGNOSIS — I509 Heart failure, unspecified: Secondary | ICD-10-CM | POA: Diagnosis not present

## 2016-06-02 DIAGNOSIS — Y999 Unspecified external cause status: Secondary | ICD-10-CM | POA: Insufficient documentation

## 2016-06-02 DIAGNOSIS — Z951 Presence of aortocoronary bypass graft: Secondary | ICD-10-CM | POA: Insufficient documentation

## 2016-06-02 DIAGNOSIS — I130 Hypertensive heart and chronic kidney disease with heart failure and stage 1 through stage 4 chronic kidney disease, or unspecified chronic kidney disease: Secondary | ICD-10-CM | POA: Diagnosis not present

## 2016-06-02 DIAGNOSIS — S32000A Wedge compression fracture of unspecified lumbar vertebra, initial encounter for closed fracture: Secondary | ICD-10-CM

## 2016-06-02 DIAGNOSIS — S32009A Unspecified fracture of unspecified lumbar vertebra, initial encounter for closed fracture: Secondary | ICD-10-CM | POA: Insufficient documentation

## 2016-06-02 DIAGNOSIS — Y939 Activity, unspecified: Secondary | ICD-10-CM | POA: Insufficient documentation

## 2016-06-02 DIAGNOSIS — Z87891 Personal history of nicotine dependence: Secondary | ICD-10-CM | POA: Diagnosis not present

## 2016-06-02 DIAGNOSIS — S3992XA Unspecified injury of lower back, initial encounter: Secondary | ICD-10-CM | POA: Diagnosis present

## 2016-06-02 MED ORDER — HYDROCODONE-ACETAMINOPHEN 5-325 MG PO TABS: ORAL_TABLET | ORAL | Status: DC

## 2016-06-02 MED ORDER — HYDROCODONE-ACETAMINOPHEN 5-325 MG PO TABS
1.0000 | ORAL_TABLET | Freq: Once | ORAL | Status: AC
  Administered 2016-06-02: 1 via ORAL
  Filled 2016-06-02: qty 1

## 2016-06-02 NOTE — Telephone Encounter (Signed)
    Patients wife called bc patient had a mechanical fall and hurt his hip. Now having excruciating back pain and can't walk. Recommended that she bring him to the ER for evaluation.   Cline Crock PA-C  [facility]

## 2016-06-02 NOTE — Discharge Instructions (Signed)
Please read and follow all provided instructions.  Your diagnoses today include:  1. Compression fracture of lumbar vertebra, closed, initial encounter (HCC)   2. Fall    Tests performed today include:  Vital signs - see below for your results today  Medications prescribed:   Vicodin (hydrocodone/acetaminophen) - narcotic pain medication  DO NOT drive or perform any activities that require you to be awake and alert because this medicine can make you drowsy. BE VERY CAREFUL not to take multiple medicines containing Tylenol (also called acetaminophen). Doing so can lead to an overdose which can damage your liver and cause liver failure and possibly death.  Use pain medication only under direct supervision at the lowest possible dose needed to control your pain.   Take any prescribed medications only as directed.  Home care instructions:   Follow any educational materials contained in this packet  Please rest, use ice or heat on your back for the next several days  Do not lift, push, pull anything more than 10 pounds for the next week  Follow-up instructions: Please follow-up with your primary care provider in the next 5 days for further evaluation of your symptoms.   Return instructions:  SEEK IMMEDIATE MEDICAL ATTENTION IF YOU HAVE:  New numbness, tingling, weakness, or problem with the use of your arms or legs  Severe back pain not relieved with medications  Loss control of your bowels or bladder  Increasing pain in any areas of the body (such as chest or abdominal pain)  Shortness of breath, dizziness, or fainting.   Worsening nausea (feeling sick to your stomach), vomiting, fever, or sweats  Any other emergent concerns regarding your health   Additional Information:  Your vital signs today were: BP 102/61 mmHg   Pulse 61   Temp(Src) 98 F (36.7 C) (Oral)   Resp 23   SpO2 95% If your blood pressure (BP) was elevated above 135/85 this visit, please have this  repeated by your doctor within one month. --------------
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
## 2016-06-02 NOTE — ED Notes (Signed)
Pt had difficulty and c/o pain in his lower back per norm while sitting up and standing up. Once up, pt was able to ambulate with assistance; he held on to the railing while walking (pt usually uses cane). Normal gait noted. Pt denied any weakness, dizziness, sharp pain in back, numbness/tingling in lower extremities.
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
## 2016-06-05 ENCOUNTER — Ambulatory Visit (INDEPENDENT_AMBULATORY_CARE_PROVIDER_SITE_OTHER): Payer: Medicare Other | Admitting: *Deleted

## 2016-06-05 DIAGNOSIS — I6350 Cerebral infarction due to unspecified occlusion or stenosis of unspecified cerebral artery: Secondary | ICD-10-CM | POA: Diagnosis not present

## 2016-06-05 DIAGNOSIS — Z7901 Long term (current) use of anticoagulants: Secondary | ICD-10-CM | POA: Diagnosis not present

## 2016-06-05 DIAGNOSIS — I482 Chronic atrial fibrillation, unspecified: Secondary | ICD-10-CM

## 2016-06-05 DIAGNOSIS — Z5181 Encounter for therapeutic drug level monitoring: Secondary | ICD-10-CM

## 2016-06-05 DIAGNOSIS — I4891 Unspecified atrial fibrillation: Secondary | ICD-10-CM

## 2016-06-05 LAB — POCT INR: INR: 3.5

## 2016-06-08 ENCOUNTER — Other Ambulatory Visit: Payer: Self-pay | Admitting: *Deleted

## 2016-06-08 MED ORDER — CARVEDILOL 3.125 MG PO TABS: 3.1250 mg | ORAL_TABLET | Freq: Two times a day (BID) | ORAL | Status: DC

## 2016-06-11 ENCOUNTER — Telehealth: Payer: Self-pay | Admitting: Cardiology

## 2016-06-11 ENCOUNTER — Encounter: Payer: Medicare Other | Admitting: *Deleted

## 2016-06-11 NOTE — Telephone Encounter (Signed)
LMOVM reminding pt to send remote transmission.   

## 2016-06-13 ENCOUNTER — Other Ambulatory Visit: Payer: Self-pay | Admitting: *Deleted

## 2016-06-13 MED ORDER — ALPRAZOLAM 0.5 MG PO TABS: 0.5000 mg | ORAL_TABLET | ORAL | Status: DC

## 2016-06-13 MED ORDER — CARVEDILOL 3.125 MG PO TABS: 3.1250 mg | ORAL_TABLET | Freq: Two times a day (BID) | ORAL | Status: AC

## 2016-06-13 NOTE — Telephone Encounter (Signed)
Rx Refill

## 2016-06-13 NOTE — Telephone Encounter (Signed)
Patients wife also requested a refill on alprazolam be sent to [facility] [place].

## 2016-06-15 ENCOUNTER — Telehealth: Payer: Self-pay | Admitting: Cardiology

## 2016-06-15 ENCOUNTER — Encounter: Payer: Self-pay | Admitting: Cardiology

## 2016-06-15 MED ORDER — ALPRAZOLAM 0.5 MG PO TABS: 0.5000 mg | ORAL_TABLET | ORAL | Status: DC

## 2016-06-15 NOTE — Telephone Encounter (Signed)
Returned call to patient's wife.Xanax refill phoned in to pharmacy.

## 2016-06-15 NOTE — Telephone Encounter (Signed)
°*  STAT* If patient is at the pharmacy, call can be transferred to refill team.   1. Which medications need to be refilled? (please list name of each medication and dose if known) Alprazolam    2. Which pharmacy/location (including street and city if local pharmacy) is medication to be sent to? [facility] [place] 3. Do they need a 30 day or 90 day supply? 30 day

## 2016-06-15 NOTE — Addendum Note (Signed)
Addended by: Neoma Laming on: 06/15/2016 10:42 AM   Modules accepted: Orders

## 2016-06-18 ENCOUNTER — Telehealth: Payer: Self-pay

## 2016-06-18 MED ORDER — ALPRAZOLAM 0.5 MG PO TABS: 0.5000 mg | ORAL_TABLET | ORAL | Status: DC

## 2016-06-18 NOTE — Telephone Encounter (Signed)
Alprazolam 0.5 mg 90 day refill phoned to [facility].

## 2016-06-20 ENCOUNTER — Encounter (INDEPENDENT_AMBULATORY_CARE_PROVIDER_SITE_OTHER): Payer: Self-pay

## 2016-06-20 ENCOUNTER — Ambulatory Visit (INDEPENDENT_AMBULATORY_CARE_PROVIDER_SITE_OTHER): Payer: Medicare Other | Admitting: *Deleted

## 2016-06-20 DIAGNOSIS — Z5181 Encounter for therapeutic drug level monitoring: Secondary | ICD-10-CM | POA: Diagnosis not present

## 2016-06-20 DIAGNOSIS — Z7901 Long term (current) use of anticoagulants: Secondary | ICD-10-CM | POA: Diagnosis not present

## 2016-06-20 DIAGNOSIS — I482 Chronic atrial fibrillation, unspecified: Secondary | ICD-10-CM

## 2016-06-20 DIAGNOSIS — I4891 Unspecified atrial fibrillation: Secondary | ICD-10-CM

## 2016-06-20 DIAGNOSIS — I6350 Cerebral infarction due to unspecified occlusion or stenosis of unspecified cerebral artery: Secondary | ICD-10-CM

## 2016-06-20 LAB — POCT INR: INR: 2.1

## 2016-07-03 ENCOUNTER — Encounter: Payer: Self-pay | Admitting: Physician Assistant

## 2016-07-10 ENCOUNTER — Ambulatory Visit (INDEPENDENT_AMBULATORY_CARE_PROVIDER_SITE_OTHER): Payer: Medicare Other | Admitting: Cardiology

## 2016-07-10 ENCOUNTER — Ambulatory Visit (INDEPENDENT_AMBULATORY_CARE_PROVIDER_SITE_OTHER): Payer: Medicare Other | Admitting: Pharmacist Clinician (PhC)/ Clinical Pharmacy Specialist

## 2016-07-10 ENCOUNTER — Encounter: Payer: Self-pay | Admitting: Cardiology

## 2016-07-10 VITALS — BP 97/54 | HR 60 | Ht 65.5 in | Wt 131.2 lb

## 2016-07-10 DIAGNOSIS — I25810 Atherosclerosis of coronary artery bypass graft(s) without angina pectoris: Secondary | ICD-10-CM

## 2016-07-10 DIAGNOSIS — I6350 Cerebral infarction due to unspecified occlusion or stenosis of unspecified cerebral artery: Secondary | ICD-10-CM

## 2016-07-10 DIAGNOSIS — I4891 Unspecified atrial fibrillation: Secondary | ICD-10-CM

## 2016-07-10 DIAGNOSIS — I482 Chronic atrial fibrillation, unspecified: Secondary | ICD-10-CM

## 2016-07-10 DIAGNOSIS — I472 Ventricular tachycardia, unspecified: Secondary | ICD-10-CM

## 2016-07-10 DIAGNOSIS — Z7901 Long term (current) use of anticoagulants: Secondary | ICD-10-CM | POA: Diagnosis not present

## 2016-07-10 DIAGNOSIS — Z5181 Encounter for therapeutic drug level monitoring: Secondary | ICD-10-CM

## 2016-07-10 DIAGNOSIS — I5022 Chronic systolic (congestive) heart failure: Secondary | ICD-10-CM

## 2016-07-10 DIAGNOSIS — E039 Hypothyroidism, unspecified: Secondary | ICD-10-CM

## 2016-07-10 DIAGNOSIS — I10 Essential (primary) hypertension: Secondary | ICD-10-CM

## 2016-07-10 DIAGNOSIS — I4729 Other ventricular tachycardia: Secondary | ICD-10-CM

## 2016-07-10 DIAGNOSIS — N183 Chronic kidney disease, stage 3 unspecified: Secondary | ICD-10-CM

## 2016-07-10 DIAGNOSIS — Z95810 Presence of automatic (implantable) cardiac defibrillator: Secondary | ICD-10-CM

## 2016-07-10 LAB — POCT INR: INR: 2.2

## 2016-07-10 NOTE — Patient Instructions (Signed)
Continue your current therapy  I will see you in 3 months with lab work.   
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
## 2016-08-06 ENCOUNTER — Ambulatory Visit (INDEPENDENT_AMBULATORY_CARE_PROVIDER_SITE_OTHER): Payer: Medicare Other | Admitting: *Deleted

## 2016-08-06 DIAGNOSIS — I482 Chronic atrial fibrillation, unspecified: Secondary | ICD-10-CM

## 2016-08-06 DIAGNOSIS — Z5181 Encounter for therapeutic drug level monitoring: Secondary | ICD-10-CM

## 2016-08-06 LAB — POCT INR: INR: 2.3

## 2016-09-10 ENCOUNTER — Ambulatory Visit (INDEPENDENT_AMBULATORY_CARE_PROVIDER_SITE_OTHER): Payer: Medicare Other | Admitting: *Deleted

## 2016-09-10 DIAGNOSIS — Z5181 Encounter for therapeutic drug level monitoring: Secondary | ICD-10-CM | POA: Diagnosis not present

## 2016-09-10 DIAGNOSIS — I482 Chronic atrial fibrillation, unspecified: Secondary | ICD-10-CM

## 2016-09-10 LAB — POCT INR: INR: 2.4

## 2016-09-13 ENCOUNTER — Other Ambulatory Visit: Payer: Self-pay | Admitting: Cardiology

## 2016-09-20 ENCOUNTER — Other Ambulatory Visit: Payer: Self-pay

## 2016-09-20 ENCOUNTER — Telehealth: Payer: Self-pay | Admitting: *Deleted

## 2016-09-20 MED ORDER — ALPRAZOLAM 0.5 MG PO TABS: 0.5000 mg | ORAL_TABLET | ORAL | 1 refills | Status: DC

## 2016-09-20 NOTE — Telephone Encounter (Signed)
Patients wife left a msg on the refill vm requesting a refill on alprazolam for the patient. Per epic this was phoned in. Please advise. Thanks, MI

## 2016-09-20 NOTE — Telephone Encounter (Signed)
Returned call to patient's wife left message on personal voice mail Xanax refill sent to [facility].

## 2016-09-27 ENCOUNTER — Other Ambulatory Visit: Payer: Self-pay | Admitting: *Deleted

## 2016-09-28 MED ORDER — ALPRAZOLAM 0.5 MG PO TABS: 0.5000 mg | ORAL_TABLET | ORAL | 0 refills | Status: DC

## 2016-09-28 NOTE — Telephone Encounter (Signed)
Patients wife Harriett Sine left a msg on the refill vm stating that she requested a refill on patients alprazolam yesterday, but [facility]. Harriett Sine can be reached at [phone]. Thanks, MI

## 2016-10-11 ENCOUNTER — Encounter: Payer: Self-pay | Admitting: Cardiology

## 2016-10-22 ENCOUNTER — Telehealth: Payer: Self-pay | Admitting: Cardiology

## 2016-10-22 DIAGNOSIS — Z951 Presence of aortocoronary bypass graft: Secondary | ICD-10-CM

## 2016-10-22 DIAGNOSIS — I10 Essential (primary) hypertension: Secondary | ICD-10-CM

## 2016-10-22 DIAGNOSIS — I5022 Chronic systolic (congestive) heart failure: Secondary | ICD-10-CM

## 2016-10-22 NOTE — Telephone Encounter (Signed)
Would you please send another lab order downstairs for tomorrow. Pt lost his lab order.

## 2016-10-22 NOTE — Telephone Encounter (Signed)
Fasting lab orders entered ( bmet,lipid,hepatic,cbc )

## 2016-10-23 LAB — CBC WITH DIFFERENTIAL/PLATELET
BASOS ABS: 0 {cells}/uL (ref 0–200)
BASOS PCT: 0 %
EOS ABS: 100 {cells}/uL (ref 15–500)
Eosinophils Relative: 1 %
HEMATOCRIT: 32.8 % — AB (ref 38.5–50.0)
HEMOGLOBIN: 10.5 g/dL — AB (ref 13.2–17.1)
Lymphocytes Relative: 18 %
Lymphs Abs: 1800 cells/uL (ref 850–3900)
MCH: 30.3 pg (ref 27.0–33.0)
MCHC: 32 g/dL (ref 32.0–36.0)
MCV: 94.5 fL (ref 80.0–100.0)
MONO ABS: 1300 {cells}/uL — AB (ref 200–950)
MONOS PCT: 13 %
MPV: 11 fL (ref 7.5–12.5)
NEUTROS ABS: 6800 {cells}/uL (ref 1500–7800)
Neutrophils Relative %: 68 %
PLATELETS: 127 10*3/uL — AB (ref 140–400)
RBC: 3.47 MIL/uL — ABNORMAL LOW (ref 4.20–5.80)
RDW: 17.3 % — ABNORMAL HIGH (ref 11.0–15.0)
WBC: 10 10*3/uL (ref 3.8–10.8)

## 2016-10-23 LAB — BASIC METABOLIC PANEL
BUN: 49 mg/dL — ABNORMAL HIGH (ref 7–25)
CHLORIDE: 106 mmol/L (ref 98–110)
CO2: 22 mmol/L (ref 20–31)
CREATININE: 2.21 mg/dL — AB (ref 0.70–1.18)
Calcium: 9.1 mg/dL (ref 8.6–10.3)
Glucose, Bld: 88 mg/dL (ref 65–99)
POTASSIUM: 4.2 mmol/L (ref 3.5–5.3)
Sodium: 137 mmol/L (ref 135–146)

## 2016-10-23 LAB — HEPATIC FUNCTION PANEL
ALT: 28 U/L (ref 9–46)
AST: 68 U/L — AB (ref 10–35)
Albumin: 2.8 g/dL — ABNORMAL LOW (ref 3.6–5.1)
Alkaline Phosphatase: 131 U/L — ABNORMAL HIGH (ref 40–115)
BILIRUBIN INDIRECT: 0.8 mg/dL (ref 0.2–1.2)
Bilirubin, Direct: 0.5 mg/dL — ABNORMAL HIGH (ref <=0.2)
TOTAL PROTEIN: 6.3 g/dL (ref 6.1–8.1)
Total Bilirubin: 1.3 mg/dL — ABNORMAL HIGH (ref 0.2–1.2)

## 2016-10-23 LAB — LIPID PANEL
CHOLESTEROL: 78 mg/dL (ref <200)
HDL: 15 mg/dL — ABNORMAL LOW (ref >40)
LDL CALC: 38 mg/dL (ref <100)
TRIGLYCERIDES: 124 mg/dL (ref <150)
Total CHOL/HDL Ratio: 5.2 Ratio — ABNORMAL HIGH (ref <5.0)
VLDL: 25 mg/dL (ref <30)

## 2016-10-24 NOTE — Progress Notes (Signed)
[name] [name] Date of Birth: 11/03/1941 Medical Record #[id number]  History of Present Illness: [name] [name]  is seen today for followup CHF.  He has a history of coronary disease and is status post CABG in 2001. This included an LIMA graft to the LAD, saphenous vein graft to the first obtuse marginal vessel, and saphenous vein graft to the PDA. Cardiac catheterization in 2005 showed patent grafts. He has a history of chronic atrial fibrillation. He also has a history of ventricular tachycardia and has a biventricular ICD. This was revised in December 2013. He has ischemic cardiomyopathy with ejection fraction of 20-25% by Echo in June 2015. He does have chronic kidney disease and is followed by nephrology. In July 2015 he received an ICD shock and was started on amiodarone. His last ICD check on March 09, 2016 was Normal with normal Optivol.  He was admitted in early June with acute respiratory failure related to a streptococcal PNA. EF 40-45% by Echo with severe pulmonary HTN. TFTs were abnormal with elevated Free T4 and TSH felt to be most likely sick euthyroid. Coreg dose reduced due to hypotension. Seen in ED later in June with L4 compression fracture after falling out of his golf cart.  On follow up today he reports he is doing better. Back pain is improved but he still has pain. No chest pain or dyspnea. Following low sodium diet.  Legs are very weak and he is spending more time in wheelchair. Weight is stable  Current Outpatient Prescriptions on File Prior to Visit  Medication Sig Dispense Refill  . acetaminophen (TYLENOL) 500 MG tablet Take 1,000 mg by mouth every 6 (six) hours as needed (pain).    Marland Kitchen. allopurinol (ZYLOPRIM) 100 MG tablet Now taking 100 mg  TID    . ALPRAZolam (XANAX) 0.5 MG tablet Take 1 tablet (0.5 mg total) by mouth See admin instructions. Take 1 tablet (0.5 mg) by mouth twice daily, may also take 1 tablet in the afternoon as needed for anxiety 90 tablet 0  . amiodarone  (PACERONE) 200 MG tablet Take 1 tablet (200 mg total) by mouth daily. 90 tablet 3  . calcitRIOL (ROCALTROL) 0.25 MCG capsule TAKE 1 CAPSULE BY MOUTH  DAILY 90 capsule 0  . carvedilol (COREG) 3.125 MG tablet Take 1 tablet (3.125 mg total) by mouth 2 (two) times daily. 180 tablet 10  . Cyanocobalamin (VITAMIN B-12 PO) Take 1 tablet by mouth daily.    . digoxin (LANOXIN) 0.125 MG tablet Take 0.5 tablets (0.0625 mg total) by mouth every other day. Take 1/2 tablet  every other day    . folic acid (FOLVITE) 1 MG tablet Take 1 tablet (1 mg total) by mouth daily. 30 tablet 0  . furosemide (LASIX) 40 MG tablet Take 1 tablet (40 mg total) by mouth 2 (two) times daily. 180 tablet 3  . HYDROcodone-acetaminophen (NORCO/VICODIN) 5-325 MG tablet Take 0.5-1 tablets by mouth every 6 (six) hours as needed. For severe pain    . levothyroxine (SYNTHROID, LEVOTHROID) 50 MCG tablet Take 1 tablet (50 mcg total) by mouth daily before breakfast. 90 tablet 3  . loperamide (IMODIUM A-D) 2 MG tablet Take 2 mg by mouth 4 (four) times daily as needed for diarrhea or loose stools.    . lovastatin (MEVACOR) 40 MG tablet Take 1 tablet (40 mg total) by mouth at bedtime. 90 tablet 3  . Multiple Vitamins-Minerals (PRESERVISION AREDS 2) CAPS Take 1 capsule by mouth daily.     .Marland Kitchen  OVER THE COUNTER MEDICATION Place 1 patch onto the skin daily as needed (pain). OTC pain patch    . OXYGEN Inhale 4 L into the lungs continuous.     . potassium chloride (K-DUR) 10 MEQ tablet Take 1 tablet (10 mEq total) by mouth daily. 90 tablet 3  . warfarin (COUMADIN) 1 MG tablet TAKE AS DIRECTED BY  COUMADIN CLINIC 90 tablet 0   No current facility-administered medications on file prior to visit.     Allergies  Allergen Reactions  . Nitroglycerin Other (See Comments)    Became severely hypotensive  . Ace Inhibitors Other (See Comments)    Listed as intolerance - Unknown Reaction per previous records.    Past Medical History:  Diagnosis Date  .  Anxiety   . Atrial fibrillation (HCC)    chronic  . Cardiomyopathy, ischemic    EF of 30-35%  . CHF (congestive heart failure) (HCC)   . CKD (chronic kidney disease) stage 3, GFR 30-59 ml/min   . Coronary artery disease   . Dyslipidemia   . Hypertension   . Myocardial infarction January 2001    History of anteroseptal myocardial infarction presenting with left bundle-branch block  . Pacemaker   . Peripheral vascular disease (HCC)   . Status post CVA   . Thyroid disease    hypo,due to amiodarone therapy,resolved  . Tobacco abuse    History of 2 pack per day tobacco habituation  . VT (ventricular tachycardia) (HCC) aug of 2010   removed old ICD and replaced with biventricular ICD    Past Surgical History:  Procedure Laterality Date  . CARDIAC CATHETERIZATION  2001   PTCA  . CARDIAC CATHETERIZATION  2005   patent grafts  . cataracts  2006   right eye  . CHOLECYSTECTOMY    . CORONARY ARTERY BYPASS GRAFT  2001   x 4  . FEMORAL BYPASS     fem-fem bpg, right fem pop bpg  . icd  07/15/2009   implant Dr. Ladona Ridgel  . IMPLANTABLE CARDIOVERTER DEFIBRILLATOR (ICD) GENERATOR CHANGE N/A 11/28/2012   Procedure: ICD GENERATOR CHANGE;  Surgeon: Marinus Maw, MD;  Location[facility] C[facility] LAB;  Service: Cardiovascular;  Laterality: N/A;    History  Smoking Status  . Former Smoker  . Quit date: 09/30/2000  Smokeless Tobacco  . Never Used    History  Alcohol Use No    Family History  Problem Relation Age of Onset  . Heart disease Mother   . Stroke Mother   . Stroke Father   . Aneurysm Brother     Review of Systems: As noted in history of present illness. All other systems were reviewed and are negative.  Physical Exam: BP (!) 100/55   Pulse 66   Ht 5\' 6"  (1.676 m)   Wt 131 lb 6.4 oz (59.6 kg)   BMI 21.21 kg/m  He is a pleasant white male who appears chronically ill. HEENT exam is unremarkable. Neck is supple no JVD, adenopathy, thyromegaly, or bruits. Lungs are clear. His  ICD site is stable. Cardiac exam reveals a regular rate and rhythm without gallop, murmur, or click. Abdomen is soft and nontender. There are no masses or bruits. Extremities reveal no pedal  edema. Pedal pulses are good. Skin is warm and dry. He is alert and oriented x3. Cranial nerves II through XII are intact.  LABORATORY DATA: Lab Results  Component Value Date   WBC 10.0 10/22/2016   HGB 10.5 (L) 10/22/2016  HCT 32.8 (L) 10/22/2016   PLT 127 (L) 10/22/2016   GLUCOSE 88 10/22/2016   CHOL 78 10/22/2016   TRIG 124 10/22/2016   HDL 15 (L) 10/22/2016   LDLCALC 38 10/22/2016   ALT 28 10/22/2016   AST 68 (H) 10/22/2016   NA 137 10/22/2016   K 4.2 10/22/2016   CL 106 10/22/2016   CREATININE 2.21 (H) 10/22/2016   BUN 49 (H) 10/22/2016   CO2 22 10/22/2016   TSH 11.646 (H) 05/06/2016   INR 1.9 10/26/2016    Echo 05/08/16:Study Conclusions  - Left ventricle: The cavity size was normal. Systolic function was   mildly to moderately reduced. The estimated ejection fraction was   in the range of 40% to 45%. There is akinesis of the   apicalanteroseptal myocardium. - Mitral valve: Calcified annulus. There was mild regurgitation. - Left atrium: The atrium was mildly dilated. - Right atrium: The atrium was mildly dilated. - Pulmonary arteries: Systolic pressure was severely increased. PA   peak pressure: 74 mm Hg (S).  Impressions:  - EF is improved when compared to prior ECHO.  Labs reviewed from primary care on 09/03/16: BUN 36, creatinine 2.18,  Hgb 11.9. WBC 12K. TSH 10.84, free T4 1.2  Assessment / Plan: 1. Chronic systolic congestive heart failure. EF improved to 40-45% in June 2017.  He  appears well compensated. Weight is stable. Last [facility]. Lungs are clear. We will continue with his current medication including carvedilol, digoxin, and Lasix. Avoid ACE inhibitor/ARB due to to renal insufficiency.   He is BiV paced. Continue  sodium restriction.   2. History of  ventricular tachycardia. Status post biventricular ICD. ICD shock in July 2015. Now on amiodarone 200 mg daily. No recurrent shocks on amiodarone.  3. Chronic atrial fibrillation. He is on anticoagulation with coumadin.  Rate is very well controlled. Adjust coumadin dose per pharmacy. INR 1.9.   4. Chronic kidney disease stage III.   5. Coronary disease status post CABG. No recurrent angina.  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  TSH still elevated but Free T4 is normal.   7. Remote CVA  8. Lumbar compression fracture. Persistent leg weakness. May benefit from PT- will arrange.

## 2016-10-26 ENCOUNTER — Encounter: Payer: Self-pay | Admitting: Cardiology

## 2016-10-26 ENCOUNTER — Ambulatory Visit (INDEPENDENT_AMBULATORY_CARE_PROVIDER_SITE_OTHER): Payer: Medicare Other | Admitting: Cardiology

## 2016-10-26 ENCOUNTER — Ambulatory Visit (INDEPENDENT_AMBULATORY_CARE_PROVIDER_SITE_OTHER): Payer: Medicare Other | Admitting: Pharmacist

## 2016-10-26 VITALS — BP 100/55 | HR 66 | Ht 66.0 in | Wt 131.4 lb

## 2016-10-26 DIAGNOSIS — I472 Ventricular tachycardia, unspecified: Secondary | ICD-10-CM

## 2016-10-26 DIAGNOSIS — I5022 Chronic systolic (congestive) heart failure: Secondary | ICD-10-CM | POA: Diagnosis not present

## 2016-10-26 DIAGNOSIS — Z951 Presence of aortocoronary bypass graft: Secondary | ICD-10-CM | POA: Diagnosis not present

## 2016-10-26 DIAGNOSIS — I482 Chronic atrial fibrillation, unspecified: Secondary | ICD-10-CM

## 2016-10-26 DIAGNOSIS — Z5181 Encounter for therapeutic drug level monitoring: Secondary | ICD-10-CM | POA: Diagnosis not present

## 2016-10-26 DIAGNOSIS — I255 Ischemic cardiomyopathy: Secondary | ICD-10-CM

## 2016-10-26 DIAGNOSIS — Z95810 Presence of automatic (implantable) cardiac defibrillator: Secondary | ICD-10-CM

## 2016-10-26 LAB — POCT INR: INR: 1.9

## 2016-10-26 NOTE — Addendum Note (Signed)
Addended by: Neoma Laming on: 10/26/2016 03:59 PM   Modules accepted: Orders

## 2016-10-26 NOTE — Patient Instructions (Signed)
Continue your current therapy  I will see you in 6 months.   

## 2016-10-31 ENCOUNTER — Telehealth: Payer: Self-pay | Admitting: Cardiology

## 2016-10-31 NOTE — Telephone Encounter (Signed)
New Message  Pts wife voiced if they have not heard from someone setting up an appt with other office to call us back and that's what she is doing.  Please f/u with pt

## 2016-10-31 NOTE — Telephone Encounter (Signed)
Spoke to wife, who informs me the patient was supposed to be set up for physical therapy referral. Has not heard anything about this yet. I informed her I'm unsure if this was ordered based on last visit notes. She stated she talked to someone in checkout who advised them it would be set up, but to call back if they had not heard anything, so she's calling back. States she also spoke to [place] about this. I made her aware [name] is out of office today, would route a message to her and send staff message to "the checkout people" (admin pool) - may be something they can follow up on today, otherwise we will follow up 1st thing next week. She voiced understanding and thanks.

## 2016-11-05 ENCOUNTER — Encounter (HOSPITAL_COMMUNITY): Payer: Self-pay

## 2016-11-05 ENCOUNTER — Observation Stay (HOSPITAL_COMMUNITY): Payer: Medicare Other

## 2016-11-05 ENCOUNTER — Emergency Department (HOSPITAL_COMMUNITY): Payer: Medicare Other

## 2016-11-05 ENCOUNTER — Other Ambulatory Visit: Payer: Self-pay

## 2016-11-05 ENCOUNTER — Inpatient Hospital Stay (HOSPITAL_COMMUNITY)
Admission: EM | Admit: 2016-11-05 | Discharge: 2016-11-09 | DRG: 683 | Disposition: A | Discharge status: Hospice | Payer: Medicare Other | Attending: Internal Medicine | Admitting: Internal Medicine

## 2016-11-05 DIAGNOSIS — I4891 Unspecified atrial fibrillation: Secondary | ICD-10-CM | POA: Diagnosis present

## 2016-11-05 DIAGNOSIS — I959 Hypotension, unspecified: Secondary | ICD-10-CM | POA: Diagnosis present

## 2016-11-05 DIAGNOSIS — N179 Acute kidney failure, unspecified: Secondary | ICD-10-CM | POA: Diagnosis not present

## 2016-11-05 DIAGNOSIS — R531 Weakness: Secondary | ICD-10-CM

## 2016-11-05 DIAGNOSIS — Z95810 Presence of automatic (implantable) cardiac defibrillator: Secondary | ICD-10-CM

## 2016-11-05 DIAGNOSIS — K921 Melena: Secondary | ICD-10-CM | POA: Diagnosis not present

## 2016-11-05 DIAGNOSIS — N183 Chronic kidney disease, stage 3 unspecified: Secondary | ICD-10-CM | POA: Diagnosis present

## 2016-11-05 DIAGNOSIS — I252 Old myocardial infarction: Secondary | ICD-10-CM

## 2016-11-05 DIAGNOSIS — I482 Chronic atrial fibrillation: Secondary | ICD-10-CM | POA: Diagnosis present

## 2016-11-05 DIAGNOSIS — Z823 Family history of stroke: Secondary | ICD-10-CM

## 2016-11-05 DIAGNOSIS — R059 Cough, unspecified: Secondary | ICD-10-CM

## 2016-11-05 DIAGNOSIS — I272 Pulmonary hypertension, unspecified: Secondary | ICD-10-CM | POA: Diagnosis present

## 2016-11-05 DIAGNOSIS — R627 Adult failure to thrive: Secondary | ICD-10-CM | POA: Diagnosis not present

## 2016-11-05 DIAGNOSIS — I5022 Chronic systolic (congestive) heart failure: Secondary | ICD-10-CM | POA: Diagnosis present

## 2016-11-05 DIAGNOSIS — Z951 Presence of aortocoronary bypass graft: Secondary | ICD-10-CM

## 2016-11-05 DIAGNOSIS — R29898 Other symptoms and signs involving the musculoskeletal system: Secondary | ICD-10-CM

## 2016-11-05 DIAGNOSIS — R791 Abnormal coagulation profile: Secondary | ICD-10-CM | POA: Diagnosis present

## 2016-11-05 DIAGNOSIS — R1313 Dysphagia, pharyngeal phase: Secondary | ICD-10-CM | POA: Diagnosis present

## 2016-11-05 DIAGNOSIS — I2510 Atherosclerotic heart disease of native coronary artery without angina pectoris: Secondary | ICD-10-CM | POA: Diagnosis present

## 2016-11-05 DIAGNOSIS — Z515 Encounter for palliative care: Secondary | ICD-10-CM | POA: Diagnosis not present

## 2016-11-05 DIAGNOSIS — I739 Peripheral vascular disease, unspecified: Secondary | ICD-10-CM | POA: Diagnosis present

## 2016-11-05 DIAGNOSIS — Z66 Do not resuscitate: Secondary | ICD-10-CM | POA: Diagnosis not present

## 2016-11-05 DIAGNOSIS — I69951 Hemiplegia and hemiparesis following unspecified cerebrovascular disease affecting right dominant side: Secondary | ICD-10-CM

## 2016-11-05 DIAGNOSIS — E039 Hypothyroidism, unspecified: Secondary | ICD-10-CM | POA: Diagnosis present

## 2016-11-05 DIAGNOSIS — Z8673 Personal history of transient ischemic attack (TIA), and cerebral infarction without residual deficits: Secondary | ICD-10-CM

## 2016-11-05 DIAGNOSIS — Z7901 Long term (current) use of anticoagulants: Secondary | ICD-10-CM

## 2016-11-05 DIAGNOSIS — M6250 Muscle wasting and atrophy, not elsewhere classified, unspecified site: Secondary | ICD-10-CM | POA: Diagnosis present

## 2016-11-05 DIAGNOSIS — I130 Hypertensive heart and chronic kidney disease with heart failure and stage 1 through stage 4 chronic kidney disease, or unspecified chronic kidney disease: Secondary | ICD-10-CM | POA: Diagnosis present

## 2016-11-05 DIAGNOSIS — E785 Hyperlipidemia, unspecified: Secondary | ICD-10-CM | POA: Diagnosis present

## 2016-11-05 DIAGNOSIS — Z87891 Personal history of nicotine dependence: Secondary | ICD-10-CM

## 2016-11-05 DIAGNOSIS — Z8249 Family history of ischemic heart disease and other diseases of the circulatory system: Secondary | ICD-10-CM

## 2016-11-05 DIAGNOSIS — R05 Cough: Secondary | ICD-10-CM

## 2016-11-05 LAB — URINALYSIS, ROUTINE W REFLEX MICROSCOPIC
BILIRUBIN URINE: NEGATIVE
GLUCOSE, UA: NEGATIVE mg/dL
HGB URINE DIPSTICK: NEGATIVE
Ketones, ur: NEGATIVE mg/dL
Leukocytes, UA: NEGATIVE
Nitrite: NEGATIVE
PROTEIN: NEGATIVE mg/dL
Specific Gravity, Urine: 1.012 (ref 1.005–1.030)
pH: 5.5 (ref 5.0–8.0)

## 2016-11-05 LAB — BASIC METABOLIC PANEL
Anion gap: 10 (ref 5–15)
BUN: 39 mg/dL — AB (ref 6–20)
CALCIUM: 9.1 mg/dL (ref 8.9–10.3)
CO2: 19 mmol/L — AB (ref 22–32)
CREATININE: 2.63 mg/dL — AB (ref 0.61–1.24)
Chloride: 108 mmol/L (ref 101–111)
GFR calc Af Amer: 25 mL/min — ABNORMAL LOW (ref >60)
GFR calc non Af Amer: 22 mL/min — ABNORMAL LOW (ref >60)
GLUCOSE: 96 mg/dL (ref 65–99)
Potassium: 4.1 mmol/L (ref 3.5–5.1)
Sodium: 137 mmol/L (ref 135–145)

## 2016-11-05 LAB — CBC
HCT: 32.4 % — ABNORMAL LOW (ref 39.0–52.0)
Hemoglobin: 10.6 g/dL — ABNORMAL LOW (ref 13.0–17.0)
MCH: 30.4 pg (ref 26.0–34.0)
MCHC: 32.7 g/dL (ref 30.0–36.0)
MCV: 92.8 fL (ref 78.0–100.0)
PLATELETS: 144 10*3/uL — AB (ref 150–400)
RBC: 3.49 MIL/uL — ABNORMAL LOW (ref 4.22–5.81)
RDW: 18 % — ABNORMAL HIGH (ref 11.5–15.5)
WBC: 13.5 10*3/uL — ABNORMAL HIGH (ref 4.0–10.5)

## 2016-11-05 LAB — CBG MONITORING, ED: GLUCOSE-CAPILLARY: 99 mg/dL (ref 65–99)

## 2016-11-05 LAB — PROTIME-INR
INR: 2.36
PROTHROMBIN TIME: 26.2 s — AB (ref 11.4–15.2)
Prothrombin Time: >90 seconds — ABNORMAL HIGH (ref 11.4–15.2)

## 2016-11-05 MED ORDER — ACETAMINOPHEN 325 MG PO TABS
650.0000 mg | ORAL_TABLET | Freq: Four times a day (QID) | ORAL | Status: DC | PRN
Start: 2016-11-05 — End: 2016-11-08

## 2016-11-05 MED ORDER — PHYTONADIONE 5 MG PO TABS
5.0000 mg | ORAL_TABLET | Freq: Once | ORAL | Status: AC
  Administered 2016-11-05: 5 mg via ORAL
  Filled 2016-11-05: qty 1

## 2016-11-05 MED ORDER — CYANOCOBALAMIN 1000 MCG/ML IJ SOLN: 1000.0000 ug | INTRAMUSCULAR | Status: DC

## 2016-11-05 MED ORDER — OCUVITE-LUTEIN PO CAPS
1.0000 | ORAL_CAPSULE | Freq: Every day | ORAL | Status: DC
  Filled 2016-11-05: qty 1

## 2016-11-05 MED ORDER — WARFARIN 0.5 MG HALF TABLET
0.5000 mg | ORAL_TABLET | ORAL | Status: AC
  Administered 2016-11-05: 0.5 mg via ORAL
  Filled 2016-11-05: qty 1

## 2016-11-05 MED ORDER — AMIODARONE HCL 200 MG PO TABS
200.0000 mg | ORAL_TABLET | Freq: Every day | ORAL | Status: DC
  Filled 2016-11-05 (×2): qty 1

## 2016-11-05 MED ORDER — ONDANSETRON HCL 4 MG/2ML IJ SOLN: 4.0000 mg | Freq: Four times a day (QID) | INTRAMUSCULAR | Status: DC | PRN

## 2016-11-05 MED ORDER — CARVEDILOL 3.125 MG PO TABS
3.1250 mg | ORAL_TABLET | Freq: Two times a day (BID) | ORAL | Status: DC
  Filled 2016-11-05 (×2): qty 1

## 2016-11-05 MED ORDER — ACETAMINOPHEN 650 MG RE SUPP
650.0000 mg | Freq: Four times a day (QID) | RECTAL | Status: DC | PRN
Start: 2016-11-05 — End: 2016-11-09

## 2016-11-05 MED ORDER — WARFARIN - PHARMACIST DOSING INPATIENT: Freq: Every day | Status: DC

## 2016-11-05 MED ORDER — DIGOXIN 125 MCG PO TABS
0.0625 mg | ORAL_TABLET | ORAL | Status: DC
  Filled 2016-11-05: qty 1

## 2016-11-05 MED ORDER — HYDROCODONE-ACETAMINOPHEN 5-325 MG PO TABS
1.0000 | ORAL_TABLET | ORAL | Status: DC | PRN
  Filled 2016-11-05: qty 1

## 2016-11-05 MED ORDER — ALPRAZOLAM 0.5 MG PO TABS: 0.5000 mg | ORAL_TABLET | Freq: Every day | ORAL | Status: DC | PRN

## 2016-11-05 MED ORDER — LOPERAMIDE HCL 2 MG PO CAPS
2.0000 mg | ORAL_CAPSULE | Freq: Four times a day (QID) | ORAL | Status: DC | PRN
Start: 2016-11-05 — End: 2016-11-06

## 2016-11-05 MED ORDER — ONDANSETRON HCL 4 MG PO TABS
4.0000 mg | ORAL_TABLET | Freq: Four times a day (QID) | ORAL | Status: DC | PRN
Start: 2016-11-05 — End: 2016-11-08

## 2016-11-05 MED ORDER — ACETAMINOPHEN 500 MG PO TABS: 1000.0000 mg | ORAL_TABLET | Freq: Four times a day (QID) | ORAL | Status: DC | PRN

## 2016-11-05 MED ORDER — WARFARIN 0.5 MG HALF TABLET: 0.5000 mg | ORAL_TABLET | Freq: Every day | ORAL | Status: DC

## 2016-11-05 MED ORDER — CALCITRIOL 0.25 MCG PO CAPS
0.2500 ug | ORAL_CAPSULE | Freq: Every day | ORAL | Status: DC
  Administered 2016-11-06 – 2016-11-07 (×2): 0.25 ug via ORAL
  Filled 2016-11-05 (×3): qty 1

## 2016-11-05 MED ORDER — SODIUM CHLORIDE 0.9 % IV SOLN
10.0000 mL/h | Freq: Once | INTRAVENOUS | Status: AC
  Administered 2016-11-05: 10 mL/h via INTRAVENOUS

## 2016-11-05 MED ORDER — ALPRAZOLAM 0.5 MG PO TABS
0.5000 mg | ORAL_TABLET | Freq: Two times a day (BID) | ORAL | Status: DC
  Administered 2016-11-05 – 2016-11-06 (×2): 0.5 mg via ORAL
  Filled 2016-11-05 (×2): qty 1

## 2016-11-05 MED ORDER — SODIUM CHLORIDE 0.9 % IV SOLN
INTRAVENOUS | Status: DC
  Administered 2016-11-05 – 2016-11-08 (×2): via INTRAVENOUS

## 2016-11-05 MED ORDER — FOLIC ACID 1 MG PO TABS
1.0000 mg | ORAL_TABLET | Freq: Every day | ORAL | Status: DC
  Administered 2016-11-06 – 2016-11-07 (×2): 1 mg via ORAL
  Filled 2016-11-05 (×3): qty 1

## 2016-11-05 MED ORDER — LEVOTHYROXINE SODIUM 50 MCG PO TABS
50.0000 ug | ORAL_TABLET | Freq: Every day | ORAL | Status: DC
  Administered 2016-11-06 – 2016-11-07 (×2): 50 ug via ORAL
  Filled 2016-11-05 (×3): qty 1

## 2016-11-05 NOTE — Telephone Encounter (Signed)
Spoke to [employer].They will be calling patient to schedule rehab for leg weakness.  Returned call to wife no answer.LMTC.

## 2016-11-05 NOTE — ED Notes (Signed)
Pt c/o back pain and request to be repositioned. Pt states back is sore like it has been since fall from golf cart.Pain reduced with reposition.  Dr. Jeraldine Loots notified and he states old injury and not transfusion reaction.

## 2016-11-05 NOTE — ED Notes (Signed)
Pt returns from xray

## 2016-11-05 NOTE — ED Triage Notes (Signed)
Pt here with wife who reports pt has had increased fatigue and weakness X2 weeks. Wife states over the last two days pt has stopped walking and stopped feeding himself. Pt is alert and oriented but appears weak.

## 2016-11-05 NOTE — ED Notes (Signed)
Pt CBG was 99 notified Mildred Banker)

## 2016-11-05 NOTE — ED Notes (Signed)
Pt returns from ct  

## 2016-11-05 NOTE — ED Notes (Signed)
Brought patient back to room via wheelchair with family in tow; myself and Luisa Hart, [place] assisted patient to a standing position and pivoted onto stretcher; visitors at bedside

## 2016-11-05 NOTE — ED Provider Notes (Signed)
[facility]-EMERGENCY DEPT Provider Note   CSN: [id number] Arrival date & time: 11/05/16  1221     History   Chief Complaint Chief Complaint  Patient presents with  . Failure To Thrive    HPI BLUFORD SEDLER is a 75 y.o. male.  HPI Patient presents with his daughter and wife. Patient is a very poor historian, has lots of assistance from them. Patient has multiple medical issues including A. fib, stroke, is on Coumadin with history of implanted defibrillator. He notes over the past 5 days patient has had progressive generalized weakness, is now unwilling to walk, has increasing anorexia. The patient himself denies pain, denies focal weakness, does acknowledge generalized weakness. He denies fever, fall, confusion, disorientation. Only recently medication changes increase in Synthroid dose.  Past Medical History:  Diagnosis Date  . Anxiety   . Atrial fibrillation (HCC)    chronic  . Cardiomyopathy, ischemic    EF of 30-35%  . CHF (congestive heart failure) (HCC)   . CKD (chronic kidney disease) stage 3, GFR 30-59 ml/min   . Coronary artery disease   . Dyslipidemia   . Hypertension   . Myocardial infarction January 2001    History of anteroseptal myocardial infarction presenting with left bundle-branch block  . Pacemaker   . Peripheral vascular disease (HCC)   . Status post CVA   . Thyroid disease    hypo,due to amiodarone therapy,resolved  . Tobacco abuse    History of 2 pack per day tobacco habituation  . VT (ventricular tachycardia) (HCC) aug of 2010   removed old ICD and replaced with biventricular ICD    Patient Active Problem List   Diagnosis Date Noted  . Chronic kidney disease, stage 3 07/10/2016  . Acute systolic congestive heart failure (HCC)   . CAP (community acquired pneumonia) 05/06/2016  . Hypothyroidism (acquired) 01/09/2016  . Encounter for therapeutic drug monitoring 01/06/2014  . Cardiomyopathy, ischemic   . VT (ventricular tachycardia) (HCC)     . Status post CVA   . VT (ventricular tachycardia) (HCC)   . Renal disease   . Atrial fibrillation (HCC)   . Dyslipidemia   . Peripheral vascular disease (HCC)   . Atrial fibrillation (HCC) 03/06/2011  . Unspecified cerebral artery occlusion with cerebral infarction 03/06/2011  . CHRONIC SYSTOLIC HEART FAILURE 12/27/2009  . Essential hypertension 06/29/2009  . MYOCARDIAL INFARCTION 06/29/2009  . Coronary atherosclerosis 06/29/2009  . VENTRICULAR TACHYCARDIA 06/29/2009  . CHF 06/29/2009  . PVD 06/29/2009  . RENAL FAILURE 06/29/2009  . Automatic implantable cardioverter-defibrillator in situ 06/29/2009  . CORONARY ARTERY BYPASS GRAFT, HX OF 06/29/2009    Past Surgical History:  Procedure Laterality Date  . CARDIAC CATHETERIZATION  2001   PTCA  . CARDIAC CATHETERIZATION  2005   patent grafts  . cataracts  2006   right eye  . CHOLECYSTECTOMY    . CORONARY ARTERY BYPASS GRAFT  2001   x 4  . FEMORAL BYPASS     fem-fem bpg, right fem pop bpg  . icd  07/15/2009   implant Dr. Ladona Ridgel  . IMPLANTABLE CARDIOVERTER DEFIBRILLATOR (ICD) GENERATOR CHANGE N/A 11/28/2012   Procedure: ICD GENERATOR CHANGE;  Surgeon: Marinus Maw, MD;  Location: [facility] CATH LAB;  Service: Cardiovascular;  Laterality: N/A;       Home Medications    Prior to Admission medications   Medication Sig Start Date End Date Taking? Authorizing Provider  acetaminophen (TYLENOL) 500 MG tablet Take 1,000 mg by mouth every 6 (six)  hours as needed (pain).   Yes Historical Provider, MD  allopurinol (ZYLOPRIM) 100 MG tablet Now taking 100 mg  TID   Yes Historical Provider, MD  ALPRAZolam (XANAX) 0.5 MG tablet Take 1 tablet (0.5 mg total) by mouth See admin instructions. Take 1 tablet (0.5 mg) by mouth twice daily, may also take 1 tablet in the afternoon as needed for anxiety 09/28/16  Yes Peter M SwazilandJordan, MD  amiodarone (PACERONE) 200 MG tablet Take 1 tablet (200 mg total) by mouth daily. 01/31/16  Yes Peter M [place]Jordan, MD   calcitRIOL (ROCALTROL) 0.25 MCG capsule TAKE 1 CAPSULE BY MOUTH  DAILY 09/13/16  Yes Peter M [place]Jordan, MD  carvedilol (COREG) 3.125 MG tablet Take 1 tablet (3.125 mg total) by mouth 2 (two) times daily. 06/13/16 06/13/17 Yes Peter M [place]Jordan, MD  cyanocobalamin (,VITAMIN B-12,) 1000 MCG/ML injection Inject 1,000 mcg into the muscle every 30 (thirty) days.   Yes Historical Provider, MD  digoxin (LANOXIN) 0.125 MG tablet Take 0.5 tablets (0.0625 mg total) by mouth every other day. Take 1/2 tablet  every other day 05/12/16  Yes Elease EtienneAnand D Hongalgi, MD  folic acid (FOLVITE) 1 MG tablet Take 1 tablet (1 mg total) by mouth daily. 05/12/16  Yes Elease EtienneAnand D Hongalgi, MD  furosemide (LASIX) 40 MG tablet Take 1 tablet (40 mg total) by mouth 2 (two) times daily. 01/31/16  Yes Peter M [place]Jordan, MD  HYDROcodone-acetaminophen (NORCO/VICODIN) 5-325 MG tablet Take 0.5-1 tablets by mouth every 6 (six) hours as needed. For severe pain 06/26/16  Yes Historical Provider, MD  levothyroxine (SYNTHROID, LEVOTHROID) 50 MCG tablet Take 1 tablet (50 mcg total) by mouth daily before breakfast. 03/13/16  Yes Peter M [place]Jordan, MD  loperamide (IMODIUM A-D) 2 MG tablet Take 2 mg by mouth 4 (four) times daily as needed for diarrhea or loose stools.   Yes Historical Provider, MD  lovastatin (MEVACOR) 40 MG tablet Take 1 tablet (40 mg total) by mouth at bedtime. 01/31/16  Yes Peter M [place]Jordan, MD  Multiple Vitamins-Minerals (PRESERVISION AREDS 2) CAPS Take 1 capsule by mouth daily.    Yes Historical Provider, MD  OVER THE COUNTER MEDICATION Place 1 patch onto the skin daily as needed (pain). OTC pain patch   Yes Historical Provider, MD  OXYGEN Inhale 4 L into the lungs continuous.    Yes Historical Provider, MD  potassium chloride (K-DUR) 10 MEQ tablet Take 1 tablet (10 mEq total) by mouth daily. 01/31/16  Yes Peter M [place]Jordan, MD  warfarin (COUMADIN) 1 MG tablet Take 0.5-1 mg by mouth daily. 0.5 mg everyday except on Wednesday pt takes 1 mg   Yes Historical  Provider, MD  Cyanocobalamin (VITAMIN B-12 PO) Take 1 tablet by mouth daily.    Historical Provider, MD  warfarin (COUMADIN) 1 MG tablet TAKE AS DIRECTED BY  COUMADIN CLINIC 09/13/16   Peter M SwazilandJordan, MD    Family History Family History  Problem Relation Age of Onset  . Heart disease Mother   . Stroke Mother   . Stroke Father   . Aneurysm Brother     Social History Social History  Substance Use Topics  . Smoking status: Former Smoker    Quit date: 09/30/2000  . Smokeless tobacco: Never Used  . Alcohol use No     Allergies   Nitroglycerin and Ace inhibitors   Review of Systems Review of Systems  Constitutional:       Per HPI, otherwise negative  HENT:  Per HPI, otherwise negative  Respiratory:       Per HPI, otherwise negative  Cardiovascular:       Per HPI, otherwise negative  Gastrointestinal: Negative for vomiting.  Endocrine:       Increased Synthroid about 2 weeks ago  Genitourinary:       Neg aside from HPI   Musculoskeletal:       Per HPI, otherwise negative  Skin:       Multiple ecchymotic areas  Neurological: Positive for tremors and weakness. Negative for syncope.  Hematological: Bruises/bleeds easily.  Psychiatric/Behavioral: Positive for decreased concentration.     Physical Exam Updated Vital Signs BP 107/56 (BP Location: Right Arm)   Pulse (!) 59   Temp 97.3 F (36.3 C) (Oral)   Resp 21   Ht 5\' 6"  (1.676 m)   Wt 130 lb (59 kg)   SpO2 95%   BMI 20.98 kg/m   Physical Exam  Constitutional: He is oriented to person, place, and time. He appears ill. No distress.  HENT:  Head: Normocephalic and atraumatic.  Eyes: Conjunctivae and EOM are normal.  Cardiovascular: Normal rate and regular rhythm.   Pulmonary/Chest: Effort normal. No stridor. No respiratory distress.  Abdominal: He exhibits no distension.  Musculoskeletal: He exhibits no edema.  Neurological: He is alert and oriented to person, place, and time. He displays atrophy and  tremor.  Strength is 4/5 in upper extremities bilaterally, lower extremities bilaterally no gross discoordination  Skin: Skin is warm and dry.  Multiple multiple ecchymotic patches, no active bleeding  Psychiatric: He has a normal mood and affect.  Nursing note and vitals reviewed.    ED Treatments / Results  Labs (all labs ordered are listed, but only abnormal results are displayed) Labs Reviewed  BASIC METABOLIC PANEL - Abnormal; Notable for the following:       Result Value   CO2 19 (*)    BUN 39 (*)    Creatinine, Ser 2.63 (*)    GFR calc non Af Amer 22 (*)    GFR calc Af Amer 25 (*)    All other components within normal limits  CBC - Abnormal; Notable for the following:    WBC 13.5 (*)    RBC 3.49 (*)    Hemoglobin 10.6 (*)    HCT 32.4 (*)    RDW 18.0 (*)    Platelets 144 (*)    All other components within normal limits  PROTIME-INR - Abnormal; Notable for the following:    Prothrombin Time >90.0 (*)    INR >10.00 (*)    All other components within normal limits  PROTIME-INR - Abnormal; Notable for the following:    Prothrombin Time 26.2 (*)    All other components within normal limits  URINALYSIS, ROUTINE W REFLEX MICROSCOPIC (NOT AT [facility])  CBG MONITORING, ED  PREPARE FRESH FROZEN PLASMA    EKG  EKG Interpretation  Date/Time:  Monday November 05 2016 13:20:03 EST Ventricular Rate:  60 PR Interval:    QRS Duration: 170 QT Interval:  498 QTC Calculation: 498 R Axis:   -136 Text Interpretation:  Ventricular-paced rhythm Biventricular pacemaker detected Abnormal ECG Abnormal ekg Confirmed by Gerhard Munch  MD ([id number]) on 11/05/2016 3:48:07 PM       Radiology Dg Chest 2 View  Result Date: 11/05/2016 CLINICAL DATA:  Fatigue.  Prior pneumonia. EXAM: CHEST  2 VIEW COMPARISON:  May 12, 2016 FINDINGS: Mild opacity in the left lung base is stable and may  represent scarring or atelectasis. No focal infiltrate. Stable AICD. Stable cardiomegaly. The hila and  mediastinum are unremarkable. No pneumothorax. IMPRESSION: Mild persistent opacity in the left lung base is unchanged. No acute focal infiltrate. Electronically Signed   By: Gerome Sam III M.D   On: 11/05/2016 17:40   Ct Head Wo Contrast  Result Date: 11/05/2016 CLINICAL DATA:  Pt here with wife who reports pt has had increased fatigue and weakness X2 weeks. Wife states over the last two days pt has stopped walking and stopped feeding himself. Pt is alert and oriented but appears weak. EXAM: CT HEAD WITHOUT CONTRAST TECHNIQUE: Contiguous axial images were obtained from the base of the skull through the vertex without intravenous contrast. COMPARISON:  10/18/2004 FINDINGS: Brain: Remote infarcts are identified within the left and right basal ganglia and left insular cortex. There is encephalomalacia of the left temporal lobe. There is no intra or extra-axial fluid collection or mass lesion. The basilar cisterns and ventricles have a normal appearance. There is no CT evidence for acute infarction or hemorrhage. There is central and cortical atrophy. Periventricular white matter changes are consistent with small vessel disease. Vascular: There is atherosclerotic calcification of the carotid siphons. Skull: Normal. Negative for fracture or focal lesion. Sinuses/Orbits: Opacity of the left maxillary sinus is new since the prior study. There is inward bowing of the lateral wall of the left maxillary sinus and the size of the sinus is smaller. The floor of the left orbit appears inferiorly displaced and findings raise the question of silent sinus syndrome. Other: None IMPRESSION: 1.  No evidence for acute  abnormality. 2. Remote infarcts within the basal ganglia and left insular cortex. 3. Atrophy and small vessel disease. 4. Question of silent sinus syndrome involving the left maxillary sinus. Question of facial asymmetry and left enophthalmos. Electronically Signed   By: Norva Pavlov M.D.   On: 11/05/2016  16:32    Procedures Procedures (including critical care time)  Medications Ordered in ED Medications - No data to display   Initial Impression / Assessment and Plan / ED Course  I have reviewed the triage vital signs and the nursing notes.  Pertinent labs & imaging results that were available during my care of the patient were reviewed by me and considered in my medical decision making (see chart for details).  Clinical Course     Initial labs notable for INR greater than 10.  Patient will receive FFP, vitamin K   Repeat INR less than 3. Patient has received medication, this may result from his FFP, or initial lab result may have been erroneous. Additional labs notable for worsening renal function.   Final Clinical Impressions(s) / ED Diagnoses   Final diagnoses:  Weakness  Supratherapeutic INR   Patient presents with weakness. Patient has multiple medical issues including prior stroke, A. fib, defibrillator, CHF. Here the patient is awake, though frail, ill-appearing. Patient has multiple areas of ecchymosis around his habitus, and given her description of new decreased ambulatory capacity, the patient had CT scan performed of his head. This did not demonstrate hemorrhage. Initial INR critically abnormal, patient received FFP, vitamin K. Second INR within normal range.  Given the patient's persistent renal dysfunction, elevated INR, substantial weakness, he required admission for further evaluation and management.  CRITICAL CARE Performed by: Gerhard Munch Total critical care time: 35 minutes Critical care time was exclusive of separately billable procedures and treating other patients. Critical care was necessary to treat or prevent imminent or life-threatening  deterioration. Critical care was time spent personally by me on the following activities: development of treatment plan with patient and/or surrogate as well as nursing, discussions with consultants,  evaluation of patient's response to treatment, examination of patient, obtaining history from patient or surrogate, ordering and performing treatments and interventions, ordering and review of laboratory studies, ordering and review of radiographic studies, pulse oximetry and re-evaluation of patient's condition.    Gerhard Munch, MD 11/05/16 1900

## 2016-11-05 NOTE — H&P (Signed)
History and Physical    [name] [name] GEX:[id number] DOB: 1941-01-29 DOA: 11/05/2016  PCP: Pamelia Hoit, MD  Patient coming from: Home  Chief Complaint: Failure to thrive  HPI: [name] [name] is a 75 y.o. male with medical history significant of atrial fibrillation, on Coumadin for anticoagulation, chronic CHF with LVEF of 30-35%. Patient has history of CVA with resultant mild right-sided hemiparesis. Patient brought to the hospital by his wife and his granddaughter as he is been very weak for the past several days. He is eating less, getting up less, and he reports that he is weak to the point he couldn't get out of the bed today so they brought him for further evaluation. Denies any fever or chills. His INR also was found to be >10.  ED Course:  Vitals: WNL Labs: WNL except INR >10, urine and INR repeat are pending Imaging: CT scan of the head none Interventions: Vitamin K and FFP  Review of Systems:  Constitutional: Generalized weakness Eyes: negative for irritation, redness and visual disturbance Ears, nose, mouth, throat, and face: negative for earaches, epistaxis, nasal congestion and sore throat Respiratory: negative for cough, dyspnea on exertion, sputum and wheezing Cardiovascular: negative for chest pain, dyspnea, lower extremity edema, orthopnea, palpitations and syncope Gastrointestinal: negative for abdominal pain, constipation, diarrhea, melena, nausea and vomiting Genitourinary:negative for dysuria, frequency and hematuria Hematologic/lymphatic: negative for bleeding, easy bruising and lymphadenopathy Musculoskeletal:negative for arthralgias, muscle weakness and stiff joints Neurological: negative for coordination problems, gait problems, headaches and weakness Endocrine: negative for diabetic symptoms including polydipsia, polyuria and weight loss Allergic/Immunologic: negative for anaphylaxis, hay fever and urticaria  Past Medical History:  Diagnosis Date    . Anxiety   . Atrial fibrillation (HCC)    chronic  . Cardiomyopathy, ischemic    EF of 30-35%  . CHF (congestive heart failure) (HCC)   . CKD (chronic kidney disease) stage 3, GFR 30-59 ml/min   . Coronary artery disease   . Dyslipidemia   . Hypertension   . Myocardial infarction January 2001    History of anteroseptal myocardial infarction presenting with left bundle-branch block  . Pacemaker   . Peripheral vascular disease (HCC)   . Status post CVA   . Thyroid disease    hypo,due to amiodarone therapy,resolved  . Tobacco abuse    History of 2 pack per day tobacco habituation  . VT (ventricular tachycardia) (HCC) aug of 2010   removed old ICD and replaced with biventricular ICD    Past Surgical History:  Procedure Laterality Date  . CARDIAC CATHETERIZATION  2001   PTCA  . CARDIAC CATHETERIZATION  2005   patent grafts  . cataracts  2006   right eye  . CHOLECYSTECTOMY    . CORONARY ARTERY BYPASS GRAFT  2001   x 4  . FEMORAL BYPASS     fem-fem bpg, right fem pop bpg  . icd  07/15/2009   implant Dr. Ladona Ridgel  . IMPLANTABLE CARDIOVERTER DEFIBRILLATOR (ICD) GENERATOR CHANGE N/A 11/28/2012   Procedure: ICD GENERATOR CHANGE;  Surgeon: Marinus Maw, MD;  Location: [facility] CATH LAB;  Service: Cardiovascular;  Laterality: N/A;     reports that he quit smoking about 16 years ago. He has never used smokeless tobacco. He reports that he does not drink alcohol or use drugs.  Allergies  Allergen Reactions  . Nitroglycerin Other (See Comments)    Became severely hypotensive  . Ace Inhibitors Other (See Comments)    Listed as intolerance - Unknown  Reaction per previous records.    Family History  Problem Relation Age of Onset  . Heart disease Mother   . Stroke Mother   . Stroke Father   . Aneurysm Brother     Prior to Admission medications   Medication Sig Start Date End Date Taking? Authorizing Provider  acetaminophen (TYLENOL) 500 MG tablet Take 1,000 mg by mouth every 6  (six) hours as needed (pain).   Yes Historical Provider, MD  allopurinol (ZYLOPRIM) 100 MG tablet Now taking 100 mg  TID   Yes Historical Provider, MD  ALPRAZolam (XANAX) 0.5 MG tablet Take 1 tablet (0.5 mg total) by mouth See admin instructions. Take 1 tablet (0.5 mg) by mouth twice daily, may also take 1 tablet in the afternoon as needed for anxiety 09/28/16  Yes Peter M [name], MD  amiodarone (PACERONE) 200 MG tablet Take 1 tablet (200 mg total) by mouth daily. 01/31/16  Yes Peter M [name], MD  calcitRIOL (ROCALTROL) 0.25 MCG capsule TAKE 1 CAPSULE BY MOUTH  DAILY 09/13/16  Yes Peter M [name], MD  carvedilol (COREG) 3.125 MG tablet Take 1 tablet (3.125 mg total) by mouth 2 (two) times daily. 06/13/16 06/13/17 Yes Peter M [name], MD  cyanocobalamin (,VITAMIN B-12,) 1000 MCG/ML injection Inject 1,000 mcg into the muscle every 30 (thirty) days.   Yes Historical Provider, MD  digoxin (LANOXIN) 0.125 MG tablet Take 0.5 tablets (0.0625 mg total) by mouth every other day. Take 1/2 tablet  every other day 05/12/16  Yes Elease Etienne, MD  folic acid (FOLVITE) 1 MG tablet Take 1 tablet (1 mg total) by mouth daily. 05/12/16  Yes Elease Etienne, MD  furosemide (LASIX) 40 MG tablet Take 1 tablet (40 mg total) by mouth 2 (two) times daily. 01/31/16  Yes Peter M [name], MD  HYDROcodone-acetaminophen (NORCO/VICODIN) 5-325 MG tablet Take 0.5-1 tablets by mouth every 6 (six) hours as needed. For severe pain 06/26/16  Yes Historical Provider, MD  levothyroxine (SYNTHROID, LEVOTHROID) 50 MCG tablet Take 1 tablet (50 mcg total) by mouth daily before breakfast. 03/13/16  Yes Peter M [name], MD  loperamide (IMODIUM A-D) 2 MG tablet Take 2 mg by mouth 4 (four) times daily as needed for diarrhea or loose stools.   Yes Historical Provider, MD  lovastatin (MEVACOR) 40 MG tablet Take 1 tablet (40 mg total) by mouth at bedtime. 01/31/16  Yes Peter M [name], MD  Multiple Vitamins-Minerals (PRESERVISION AREDS 2) CAPS Take 1 capsule by  mouth daily.    Yes Historical Provider, MD  OVER THE COUNTER MEDICATION Place 1 patch onto the skin daily as needed (pain). OTC pain patch   Yes Historical Provider, MD  OXYGEN Inhale 4 L into the lungs continuous.    Yes Historical Provider, MD  potassium chloride (K-DUR) 10 MEQ tablet Take 1 tablet (10 mEq total) by mouth daily. 01/31/16  Yes Peter M [name], MD  warfarin (COUMADIN) 1 MG tablet Take 0.5-1 mg by mouth daily. 0.5 mg everyday except on Wednesday pt takes 1 mg   Yes Historical Provider, MD  Cyanocobalamin (VITAMIN B-12 PO) Take 1 tablet by mouth daily.    Historical Provider, MD  warfarin (COUMADIN) 1 MG tablet TAKE AS DIRECTED BY  COUMADIN CLINIC 09/13/16   Peter M [name], MD    Physical Exam:  Vitals:   11/05/16 1530 11/05/16 1531 11/05/16 1545 11/05/16 1621  BP: 107/56 107/56 102/55 (!) 89/57  Pulse: 60 (!) 59 62 60  Resp:  21 19 18  Temp:  97.3 F (36.3 C)    TempSrc:  Oral    SpO2: (!) 88% 95% 93% 91%  Weight:  59 kg (130 lb)    Height:  5\' 6"  (1.676 m)      Constitutional: NAD, calm, comfortable Eyes: PERRL, lids and conjunctivae normal ENMT: Mucous membranes are moist. Posterior pharynx clear of any exudate or lesions.Normal dentition.  Neck: normal, supple, no masses, no thyromegaly Respiratory: clear to auscultation bilaterally, no wheezing, no crackles. Normal respiratory effort. No accessory muscle use.  Cardiovascular: Regular rate and rhythm, no murmurs / rubs / gallops. No extremity edema. 2+ pedal pulses. No carotid bruits.  Abdomen: no tenderness, no masses palpated. No hepatosplenomegaly. Bowel sounds positive.  Musculoskeletal: no clubbing / cyanosis. No joint deformity upper and lower extremities. Good ROM, no contractures. Normal muscle tone.  Skin: no rashes, lesions, ulcers. No induration Neurologic: CN 2-12 grossly intact. Sensation intact, DTR normal. Strength 5/5 in all 4.  Psychiatric: Normal judgment and insight. Alert and oriented x 3. Normal  mood.   Labs on Admission: I have personally reviewed following labs and imaging studies  CBC:  Recent Labs Lab 11/05/16 1319  WBC 13.5*  HGB 10.6*  HCT 32.4*  MCV 92.8  PLT 144*   Basic Metabolic Panel:  Recent Labs Lab 11/05/16 1319  NA 137  K 4.1  CL 108  CO2 19*  GLUCOSE 96  BUN 39*  CREATININE 2.63*  CALCIUM 9.1   GFR: Estimated Creatinine Clearance: 19.6 mL/min (by C-G formula based on SCr of 2.63 mg/dL (H)). Liver Function Tests: No results for input(s): AST, ALT, ALKPHOS, BILITOT, PROT, ALBUMIN in the last 168 hours. No results for input(s): LIPASE, AMYLASE in the last 168 hours. No results for input(s): AMMONIA in the last 168 hours. Coagulation Profile:  Recent Labs Lab 11/05/16 1319  INR >10.00*   Cardiac Enzymes: No results for input(s): CKTOTAL, CKMB, CKMBINDEX, TROPONINI in the last 168 hours. BNP (last 3 results) No results for input(s): PROBNP in the last 8760 hours. HbA1C: No results for input(s): HGBA1C in the last 72 hours. CBG:  Recent Labs Lab 11/05/16 1544  GLUCAP 99   Lipid Profile: No results for input(s): CHOL, HDL, LDLCALC, TRIG, CHOLHDL, LDLDIRECT in the last 72 hours. Thyroid Function Tests: No results for input(s): TSH, T4TOTAL, FREET4, T3FREE, THYROIDAB in the last 72 hours. Anemia Panel: No results for input(s): VITAMINB12, FOLATE, FERRITIN, TIBC, IRON, RETICCTPCT in the last 72 hours. Urine analysis: No results found for: COLORURINE, APPEARANCEUR, LABSPEC, PHURINE, GLUCOSEU, HGBUR, BILIRUBINUR, KETONESUR, PROTEINUR, UROBILINOGEN, NITRITE, LEUKOCYTESUR Sepsis Labs: !!!!!!!!!!!!!!!!!!!!!!!!!!!!!!!!!!!!!!!!!!!! Invalid input(s): PROCALCITONIN, LACTICIDVEN No results found for this or any previous visit (from the past 240 hour(s)).   Radiological Exams on Admission: Ct Head Wo Contrast  Result Date: 11/05/2016 CLINICAL DATA:  Pt here with wife who reports pt has had increased fatigue and weakness X2 weeks. Wife states  over the last two days pt has stopped walking and stopped feeding himself. Pt is alert and oriented but appears weak. EXAM: CT HEAD WITHOUT CONTRAST TECHNIQUE: Contiguous axial images were obtained from the base of the skull through the vertex without intravenous contrast. COMPARISON:  10/18/2004 FINDINGS: Brain: Remote infarcts are identified within the left and right basal ganglia and left insular cortex. There is encephalomalacia of the left temporal lobe. There is no intra or extra-axial fluid collection or mass lesion. The basilar cisterns and ventricles have a normal appearance. There is no CT evidence for acute infarction or hemorrhage.  There is central and cortical atrophy. Periventricular white matter changes are consistent with small vessel disease. Vascular: There is atherosclerotic calcification of the carotid siphons. Skull: Normal. Negative for fracture or focal lesion. Sinuses/Orbits: Opacity of the left maxillary sinus is new since the prior study. There is inward bowing of the lateral wall of the left maxillary sinus and the size of the sinus is smaller. The floor of the left orbit appears inferiorly displaced and findings raise the question of silent sinus syndrome. Other: None IMPRESSION: 1.  No evidence for acute  abnormality. 2. Remote infarcts within the basal ganglia and left insular cortex. 3. Atrophy and small vessel disease. 4. Question of silent sinus syndrome involving the left maxillary sinus. Question of facial asymmetry and left enophthalmos. Electronically Signed   By: Norva Pavlov M.D.   On: 11/05/2016 16:32    EKG: Independently reviewed.   Assessment/Plan Principal Problem:   Failure to thrive in adult Active Problems:   CHRONIC SYSTOLIC HEART FAILURE   Atrial fibrillation (HCC)   Status post CVA   Hypothyroidism (acquired)   Chronic kidney disease, stage 3   Weakness    Failure to thrive in adult -Generalized weakness, patient is not able to ambulate for the  past several days special today. -Mild leukocytosis at 13.5, no fever or chills. -Check urine, check x-ray, repeat INR. Check TSH. -We'll obtain PT/OT evaluation in a.m.  Supratherapeutic INR -INR is reported to be greater than 10, patient denies any evidence of bleeding. -He has mild ecchymosis on the dorsum of both arms. -CT scan of the head was done showed no evidence of bleeding. -Repeat INR, he has 4 units of FFP lined up for transfusion and vitamin K to be given.  Atrial fibrillation -Chronic atrial fibrillation, patient is on amiodarone, continued. -CHA2DS2-VASc of at least 3, is in Coumadin, currently on hold secondary to supratherapeutic INR.  Chronic systolic CHF -LVEF is 40-45% in May 2017. -Currently appears to be compensated without symptoms or signs of decompensation.  Hypothyroidism -TSH was recently elevated at 11.646, Synthroid adjusted back then. -Check TSH.   DVT prophylaxis: SQ Heparin Code Status: Full code Family Communication: Plan D/W patient Disposition Plan: Home Consults called:  Admission status: Observation   Zachary Nole A MD [facility] Pager [phone]  If 7PM-7AM, please contact night-coverage [email or web address] Password [email or web address]  11/05/2016, 5:25 PM

## 2016-11-05 NOTE — ED Notes (Signed)
Pt denies c/o fever discomfort. Rate increased to 200cc/h

## 2016-11-05 NOTE — Progress Notes (Signed)
ANTICOAGULATION CONSULT NOTE - Initial Consult  Pharmacy Consult for Warfarin Indication: atrial fibrillation  Allergies  Allergen Reactions  . Nitroglycerin Other (See Comments)    Became severely hypotensive  . Ace Inhibitors Other (See Comments)    Listed as intolerance - Unknown Reaction per previous records.    Patient Measurements: Height: 5\' 6"  (167.6 cm) Weight: 132 lb 14.4 oz (60.3 kg) IBW/kg (Calculated) : 63.8  Vital Signs: Temp: 97.4 F (36.3 C) (11/27 1945) Temp Source: Oral (11/27 1945) BP: 100/53 (11/27 1945) Pulse Rate: 61 (11/27 1945)  Labs:  Recent Labs  11/05/16 1319 11/05/16 1815  HGB 10.6*  --   HCT 32.4*  --   PLT 144*  --   LABPROT >90.0* 26.2*  INR >10.00* 2.36  CREATININE 2.63*  --     Estimated Creatinine Clearance: 20.1 mL/min (by C-G formula based on SCr of 2.63 mg/dL (H)).   Medical History: Past Medical History:  Diagnosis Date  . Anxiety   . Atrial fibrillation (HCC)    chronic  . Cardiomyopathy, ischemic    EF of 30-35%  . CHF (congestive heart failure) (HCC)   . CKD (chronic kidney disease) stage 3, GFR 30-59 ml/min   . Coronary artery disease   . Dyslipidemia   . Hypertension   . Myocardial infarction January 2001    History of anteroseptal myocardial infarction presenting with left bundle-branch block  . Pacemaker   . Peripheral vascular disease (HCC)   . Status post CVA   . Thyroid disease    hypo,due to amiodarone therapy,resolved  . Tobacco abuse    History of 2 pack per day tobacco habituation  . VT (ventricular tachycardia) (HCC) aug of 2010   removed old ICD and replaced with biventricular ICD    Medications:  Prescriptions Prior to Admission  Medication Sig Dispense Refill Last Dose  . acetaminophen (TYLENOL) 500 MG tablet Take 1,000 mg by mouth every 6 (six) hours as needed (pain).   Past Month at Unknown time  . allopurinol (ZYLOPRIM) 100 MG tablet Now taking 100 mg  TID   11/05/2016 at Unknown time   . ALPRAZolam (XANAX) 0.5 MG tablet Take 1 tablet (0.5 mg total) by mouth See admin instructions. Take 1 tablet (0.5 mg) by mouth twice daily, may also take 1 tablet in the afternoon as needed for anxiety 90 tablet 0 11/04/2016 at Unknown time  . amiodarone (PACERONE) 200 MG tablet Take 1 tablet (200 mg total) by mouth daily. 90 tablet 3 11/05/2016 at Unknown time  . calcitRIOL (ROCALTROL) 0.25 MCG capsule TAKE 1 CAPSULE BY MOUTH  DAILY 90 capsule 0 11/05/2016 at Unknown time  . carvedilol (COREG) 3.125 MG tablet Take 1 tablet (3.125 mg total) by mouth 2 (two) times daily. 180 tablet 10 11/05/2016 at 8a  . cyanocobalamin (,VITAMIN B-12,) 1000 MCG/ML injection Inject 1,000 mcg into the muscle every 30 (thirty) days.   Past Week at Unknown time  . digoxin (LANOXIN) 0.125 MG tablet Take 0.5 tablets (0.0625 mg total) by mouth every other day. Take 1/2 tablet  every other day   11/04/2016 at Unknown time  . folic acid (FOLVITE) 1 MG tablet Take 1 tablet (1 mg total) by mouth daily. 30 tablet 0 11/05/2016 at Unknown time  . furosemide (LASIX) 40 MG tablet Take 1 tablet (40 mg total) by mouth 2 (two) times daily. 180 tablet 3 11/05/2016 at Unknown time  . HYDROcodone-acetaminophen (NORCO/VICODIN) 5-325 MG tablet Take 0.5-1 tablets by mouth every 6 (  six) hours as needed. For severe pain   Past Month at Unknown time  . levothyroxine (SYNTHROID, LEVOTHROID) 50 MCG tablet Take 1 tablet (50 mcg total) by mouth daily before breakfast. 90 tablet 3 11/05/2016 at Unknown time  . loperamide (IMODIUM A-D) 2 MG tablet Take 2 mg by mouth 4 (four) times daily as needed for diarrhea or loose stools.   Past Month at Unknown time  . lovastatin (MEVACOR) 40 MG tablet Take 1 tablet (40 mg total) by mouth at bedtime. 90 tablet 3 11/04/2016 at Unknown time  . Multiple Vitamins-Minerals (PRESERVISION AREDS 2) CAPS Take 1 capsule by mouth daily.    11/04/2016 at Unknown time  . OVER THE COUNTER MEDICATION Place 1 patch onto the  skin daily as needed (pain). OTC pain patch   Past Month at Unknown time  . OXYGEN Inhale 4 L into the lungs continuous.    Past Week at Unknown time  . potassium chloride (K-DUR) 10 MEQ tablet Take 1 tablet (10 mEq total) by mouth daily. 90 tablet 3 11/04/2016 at Unknown time  . warfarin (COUMADIN) 1 MG tablet Take 0.5-1 mg by mouth daily. 0.5 mg everyday except on Wednesday pt takes 1 mg   11/05/2016 at 8a  . Cyanocobalamin (VITAMIN B-12 PO) Take 1 tablet by mouth daily.   Not Taking at Unknown time  . warfarin (COUMADIN) 1 MG tablet TAKE AS DIRECTED BY  COUMADIN CLINIC 90 tablet 0 Taking    Assessment: 75 yo M brought to ED with increasing weakness for the past week to the point where he could not get out of bed today.  Pt is on Coumadin PTA and reportedly had an initial INR >10.  No bleeding noted.  Pt received Vit K 5mg  PO and 1 unit of FFP (4 total were ordered, then cancelled).  Suspect initial INR was a lab error as repeat lab was 2.36 (drawn before Vit K given) and pt has been stable on this home dose for last 3 months.  Pt took Coumadin 0.5mg  PO this morning.    Home Coumadin dose = 0.5mg  PO daily except Wednesdays - none on Wednesday.  Goal of Therapy:  INR 2-3 Monitor platelets by anticoagulation protocol: Yes   Plan:  Coumadin 0.5 mg PO x 1 tonight. **Aware that pt has already had dose today but I am concerned that his INR will continue to fall based on the Vit K and FFP he received earlier today.  We may not see the full effects of the Vit K until 11/29 or so.  Daily INR.  Toys 'R' UsKimberly Daruis Swaim, Pharm.D., BCPS Clinical Pharmacist Pager [phone] 11/05/2016 8:52 PM

## 2016-11-05 NOTE — ED Notes (Signed)
Lab called with INR > 10.00 and notified Dr. Juleen China

## 2016-11-05 NOTE — ED Notes (Signed)
Attempted to call report. Ginger charge nurse states cardiac monitoring must be discontinued to proceed with admit.

## 2016-11-06 ENCOUNTER — Observation Stay (HOSPITAL_COMMUNITY): Payer: Medicare Other

## 2016-11-06 DIAGNOSIS — R627 Adult failure to thrive: Secondary | ICD-10-CM | POA: Diagnosis not present

## 2016-11-06 LAB — CBC
HCT: 29.6 % — ABNORMAL LOW (ref 39.0–52.0)
HEMOGLOBIN: 9.9 g/dL — AB (ref 13.0–17.0)
MCH: 31 pg (ref 26.0–34.0)
MCHC: 33.4 g/dL (ref 30.0–36.0)
MCV: 92.8 fL (ref 78.0–100.0)
PLATELETS: 121 10*3/uL — AB (ref 150–400)
RBC: 3.19 MIL/uL — AB (ref 4.22–5.81)
RDW: 17.8 % — ABNORMAL HIGH (ref 11.5–15.5)
WBC: 11.9 10*3/uL — AB (ref 4.0–10.5)

## 2016-11-06 LAB — BASIC METABOLIC PANEL
ANION GAP: 11 (ref 5–15)
BUN: 42 mg/dL — ABNORMAL HIGH (ref 6–20)
CALCIUM: 9 mg/dL (ref 8.9–10.3)
CO2: 21 mmol/L — ABNORMAL LOW (ref 22–32)
CREATININE: 2.67 mg/dL — AB (ref 0.61–1.24)
Chloride: 107 mmol/L (ref 101–111)
GFR, EST AFRICAN AMERICAN: 25 mL/min — AB (ref >60)
GFR, EST NON AFRICAN AMERICAN: 21 mL/min — AB (ref >60)
Glucose, Bld: 104 mg/dL — ABNORMAL HIGH (ref 65–99)
Potassium: 3.8 mmol/L (ref 3.5–5.1)
SODIUM: 139 mmol/L (ref 135–145)

## 2016-11-06 LAB — T4, FREE: FREE T4: 1.35 ng/dL — AB (ref 0.61–1.12)

## 2016-11-06 LAB — TSH: TSH: 5.557 u[IU]/mL — ABNORMAL HIGH (ref 0.350–4.500)

## 2016-11-06 LAB — DIGOXIN LEVEL: DIGOXIN LVL: 1.3 ng/mL (ref 0.8–2.0)

## 2016-11-06 LAB — PROTIME-INR
INR: 2.1
PROTHROMBIN TIME: 23.9 s — AB (ref 11.4–15.2)

## 2016-11-06 MED ORDER — DOCUSATE SODIUM 100 MG PO CAPS
100.0000 mg | ORAL_CAPSULE | Freq: Two times a day (BID) | ORAL | Status: DC
  Administered 2016-11-06 – 2016-11-07 (×3): 100 mg via ORAL
  Filled 2016-11-06 (×4): qty 1

## 2016-11-06 MED ORDER — POLYETHYLENE GLYCOL 3350 17 G PO PACK
17.0000 g | PACK | Freq: Every day | ORAL | Status: DC
  Administered 2016-11-06: 17 g via ORAL
  Filled 2016-11-06 (×2): qty 1

## 2016-11-06 MED ORDER — SODIUM CHLORIDE 0.9 % IV BOLUS (SEPSIS)
500.0000 mL | Freq: Once | INTRAVENOUS | Status: AC
  Administered 2016-11-06: 500 mL via INTRAVENOUS

## 2016-11-06 MED ORDER — WARFARIN SODIUM 1 MG PO TABS
1.0000 mg | ORAL_TABLET | Freq: Once | ORAL | Status: AC
  Administered 2016-11-06: 1 mg via ORAL
  Filled 2016-11-06: qty 1

## 2016-11-06 MED ORDER — PROSIGHT PO TABS
1.0000 | ORAL_TABLET | Freq: Every day | ORAL | Status: DC
  Administered 2016-11-06 – 2016-11-07 (×2): 1 via ORAL
  Filled 2016-11-06 (×3): qty 1

## 2016-11-06 MED ORDER — SODIUM CHLORIDE 0.9 % IV BOLUS (SEPSIS)
250.0000 mL | Freq: Once | INTRAVENOUS | Status: AC
  Administered 2016-11-06: 250 mL via INTRAVENOUS

## 2016-11-06 NOTE — Progress Notes (Signed)
Pt's BP 92/44. NP notified. bolus ordered. Will continue to monitor.

## 2016-11-06 NOTE — Telephone Encounter (Signed)
Patient currently admitted, [facility].

## 2016-11-06 NOTE — Evaluation (Signed)
Physical Therapy Evaluation Patient Details Name: [name] GriffithDonnie J [name] MRN: [id number] DOB: 02/12/1941 Today's Date: 11/06/2016   History of Present Illness  75 y.o.malewith medical history significant of atrial fibrillation, on Coumadin for anticoagulation, chronic CHF with LVEF of 30-35%. Patient has history of CVA with resultant mild right-sided hemiparesis. Patient brought to the hospital by his wife and his granddaughter as he is been very weak for the past several days--to the point he couldn't get out of the bed. His INR also was found to be >10. +hypotension    Clinical Impression  Pt admitted with above diagnosis. Patient extremely weak and with BP 85/45 (MAP 55) in supine, he was not stable to even attempt sitting EOB. Required max assist to roll x 3. Pt currently with functional limitations due to the deficits listed below (see PT Problem List).  Pt with significant decline in past week and may benefit from skilled PT to increase their independence and safety with mobility to allow discharge to the venue listed below.  ? If Palliative Care consult is indicated?    Follow Up Recommendations SNF;Supervision/Assistance - 24 hour    Equipment Recommendations  Other (comment) (TBA next venue)    Recommendations for Other Services       Precautions / Restrictions Precautions Precautions: Fall      Mobility  Bed Mobility Overal bed mobility: Needs Assistance Bed Mobility: Rolling Rolling: Max assist         General bed mobility comments: with cues able to initiate rolling, however too weak to effectively use rail   Transfers                 General transfer comment: unable to attempt due to hypotension  Ambulation/Gait                Stairs            Wheelchair Mobility    Modified Rankin (Stroke Patients Only)       Balance                                             Pertinent Vitals/Pain Pain Assessment: No/denies  pain    Home Living Family/patient expects to be discharged to:: Skilled nursing facility                      Prior Function Level of Independence: Needs assistance   Gait / Transfers Assistance Needed: ~5 days ago pt ambulated with cane with Modif I and has had some falls.            Hand Dominance        Extremity/Trunk Assessment   Upper Extremity Assessment: Defer to OT evaluation           Lower Extremity Assessment: RLE deficits/detail;LLE deficits/detail RLE Deficits / Details: AAROM WFL; strength 2+ to 3 throughout LLE Deficits / Details: AAROM WFL; strength 2+ to 3 throughout  Cervical / Trunk Assessment: Other exceptions  Communication   Communication: Expressive difficulties (mumbling, word-finding)  Cognition Arousal/Alertness: Awake/alert Behavior During Therapy: WFL for tasks assessed/performed Overall Cognitive Status: Impaired/Different from baseline Area of Impairment: Memory;Safety/judgement;Awareness;Problem solving     Memory: Decreased short-term memory   Safety/Judgement: Decreased awareness of safety;Decreased awareness of deficits Awareness:  (pre-intellectual) Problem Solving: Slow processing;Decreased initiation;Difficulty sequencing;Requires verbal cues;Requires tactile cues General Comments: states he walked yesterday (  no per his daughter); thinks he is ready to go home with wife caring for him; max cues for rolling    General Comments      Exercises     Assessment/Plan    PT Assessment Patient needs continued PT services  PT Problem List Decreased strength;Decreased activity tolerance;Decreased mobility;Decreased cognition;Decreased knowledge of use of DME;Decreased safety awareness;Decreased knowledge of precautions          PT Treatment Interventions DME instruction;Functional mobility training;Therapeutic activities;Therapeutic exercise;Balance training;Cognitive remediation;Patient/family education    PT Goals  (Current goals can be found in the Care Plan section)  Acute Rehab PT Goals Patient Stated Goal: wants to go home PT Goal Formulation: With patient Time For Goal Achievement: 11/20/16 Potential to Achieve Goals: Fair    Frequency Min 2X/week   Barriers to discharge        Co-evaluation               End of Session Equipment Utilized During Treatment: Oxygen Activity Tolerance: Treatment limited secondary to medical complications (Comment) (hypotension) Patient left: in bed;with call bell/phone within reach;with bed alarm set;with family/visitor present Nurse Communication: Mobility status;Other (comment) (continued hypotension)    Functional Assessment Tool Used: clinical judgement Functional Limitation: Changing and maintaining body position Changing and Maintaining Body Position Current Status (L8921): At least 80 percent but less than 100 percent impaired, limited or restricted Changing and Maintaining Body Position Goal Status (J9417): At least 20 percent but less than 40 percent impaired, limited or restricted    Time: 4081-4481 PT Time Calculation (min) (ACUTE ONLY): 30 min   Charges:   PT Evaluation $PT Eval High Complexity: 1 Procedure     PT G Codes:   PT G-Codes **NOT FOR INPATIENT CLASS** Functional Assessment Tool Used: clinical judgement Functional Limitation: Changing and maintaining body position Changing and Maintaining Body Position Current Status (E5631): At least 80 percent but less than 100 percent impaired, limited or restricted Changing and Maintaining Body Position Goal Status (S9702): At least 20 percent but less than 40 percent impaired, limited or restricted    Zena Amos 11/06/2016, 4:39 PM Pager [phone]

## 2016-11-06 NOTE — Progress Notes (Signed)
At around 8:00pm, pt arrived floor, and report given to the day RN, Denny Peon. She reported off to the night RN, Toyin. Pt was admitted to the floor, 5W 28. Pt just completed her first bag of the 4 ordered bags of frozen plasma that was started in the ED. MD was notified of pt's arrival to the floor and needing orders. New orders were put in for pt. The admitting MD, Dr. Arthor Captain discontinued the order for the transfusion of the frozen plasma on transfer. Pt and family were informed, CN was notified and the unused blood product that was sent up with the pt in the cooler was returned to the blood bank. Pt is doing well, resting. Will continue to monitor.

## 2016-11-06 NOTE — Discharge Instructions (Signed)

## 2016-11-06 NOTE — Progress Notes (Signed)
CSW received consult regarding PT recommendation of SNF at discharge.  Patient's daughter is refusing SNF and would like home health. She stays with her parents as well as her niece and brother.  CSW signing off.   Osborne Casco Riata Ikeda LCSWA [phone]

## 2016-11-06 NOTE — Progress Notes (Signed)
Initial Nutrition Assessment  DOCUMENTATION CODES:   Not applicable  INTERVENTION:   -HS snack  NUTRITION DIAGNOSIS:   Unintentional weight loss related to acute illness as evidenced by per patient/family report.  GOAL:   Patient will meet greater than or equal to 90% of their needs  MONITOR:   PO intake, Labs, Weight trends, Skin, I & O's  REASON FOR ASSESSMENT:   Malnutrition Screening Tool, Consult Assessment of nutrition requirement/status  ASSESSMENT:   [name] [name] is a 75 y.o. male with medical history significant of atrial fibrillation, on Coumadin for anticoagulation, chronic CHF with LVEF of 30-35%. Patient has history of CVA with resultant mild right-sided hemiparesis. Patient brought to the hospital by his wife and his granddaughter as he is been very weak for the past several days. He is eating less, getting up less, and he reports that he is weak to the point he couldn't get out of the bed today so they brought him for further evaluation. Denies any fever or chills. His INR also was found to be >10.  Pt admitted with adult FTT.   Spoke with pt and family at bedside. They report that pt has always had a good appetite and generally consumes 3 meals per day. Intake consists of "a big breakfast (eggs, sausage, toast)", lunch of soup and sandwich, dinner of meat, starch, and vegetable. Pt pt daughter, pt wife is very vigilant in ensuring pt eats well. Pt ate all of his breakfast this AM.  Pt daughter reports that pt started losing weight about a year ago after starting synthroid and during hospitalization with pneumonia in the early part of 2017. Since then, pt has maintained his weight, however, unable to regain lost weight back. Reviewed wt hx, which reveals wt stability over the past year, however, UBW is around 160#.   Nutrition-Focused physical exam completed. Findings are mild fat depletion, mild muscle depletion, and no edema.   Discussed ways pt could  increase protein intake to help preserve muscle mass and assist with wt management. Pt has used Ensure in the past. Additionally, pt daughter shared that she would like to prepare protein shakes with whey protein at home.   Labs reviewed.  Diet Order:  Diet Heart Room service appropriate? Yes; Fluid consistency: Thin  Skin:  Reviewed, no issues  Last BM:  PTA  Height:   Ht Readings from Last 1 Encounters:  11/05/16 5\' 6"  (1.676 m)    Weight:   Wt Readings from Last 1 Encounters:  11/05/16 132 lb 14.4 oz (60.3 kg)    Ideal Body Weight:  64.5 kg  BMI:  Body mass index is 21.45 kg/m.  Estimated Nutritional Needs:   Kcal:  1600-1800  Protein:  75-90 grams  Fluid:  1.6-1.8 L  EDUCATION NEEDS:   Education needs addressed  Trevin Gartrell A. Mayford Knife, RD, LDN, CDE Pager: [phone] After hours Pager: [phone]

## 2016-11-06 NOTE — Progress Notes (Signed)
Pt had a bright red bloody stool. NP notified. No new orders.

## 2016-11-06 NOTE — Progress Notes (Signed)
ANTICOAGULATION CONSULT NOTE - Follow Up Consult  Pharmacy Consult for Coumadin Indication: atrial fibrillation  Allergies  Allergen Reactions  . Nitroglycerin Other (See Comments)    Became severely hypotensive  . Ace Inhibitors Other (See Comments)    Listed as intolerance - Unknown Reaction per previous records.    Patient Measurements: Height: 5\' 6"  (167.6 cm) Weight: 132 lb 14.4 oz (60.3 kg) IBW/kg (Calculated) : 63.8  Vital Signs: Temp: 98.5 F (36.9 C) (11/28 0910) Temp Source: Oral (11/28 0910) BP: 74/37 (11/28 0910) Pulse Rate: 59 (11/28 0910)  Labs:  Recent Labs  11/05/16 1319 11/05/16 1815 11/06/16 0514  HGB 10.6*  --  9.9*  HCT 32.4*  --  29.6*  PLT 144*  --  121*  LABPROT >90.0* 26.2* 23.9*  INR >10.00* 2.36 2.10  CREATININE 2.63*  --  2.67*    Estimated Creatinine Clearance: 19.8 mL/min (by C-G formula based on SCr of 2.67 mg/dL (H)).  Assessment:  75 yo M brought to ED with increasing weakness for the past week to the point where he could not get out of bed on 11/05/16.  Pt is on Coumadin PTA and reportedly had an initial INR >10 on 11/27.  No bleeding noted.  Pt received Vit K 5mg  PO and 1 unit of FFP (4 total were ordered, then cancelled).  Suspect initial INR was a lab error as repeat lab was 2.36 (drawn before Vit K given) and pt had been stable on this home dose for last 3 months.  He had taken Coumadin 0.5 mg at home in the morning of 11/27, and another 0.5 mg was given at [facility] in the pm, due to concern that INR would continue to fall after Vit K and FFP.   INR 2.10 today. Renains therapeutic, but trended down.  Home Coumadin regimen: 0.5mg  PO daily except none on Wednesdays  Goal of Therapy:  INR 2-3 Monitor platelets by anticoagulation protocol: Yes   Plan:   Coumadin 1 mg x 1 today, rather than usual 0.5 mg.  Daily PT/INR.  May need a dose on Wednesday this week.  Discussed with patient and wife.  Dennie Fetters, [place] Pager:  [phone] 11/06/2016,12:20 PM

## 2016-11-06 NOTE — Progress Notes (Signed)
PROGRESS NOTE    [name] [name]  ZOX:[id number] DOB: June 03, 1941 DOA: 11/05/2016 PCP: Pamelia Hoit, MD     Brief Narrative:  [name] [name] is a 75 y.o. male with medical history significant of atrial fibrillation, on Coumadin for anticoagulation, chronic CHF with LVEF of 30-35%. Patient has history of CVA with resultant mild right-sided hemiparesis. Patient brought to the hospital by his wife and his granddaughter as he is been very weak for the past several days. He is eating less, getting up less, and he reports that he is weak to the point he couldn't get out of the bed today so they brought him for further evaluation. Denies any fever or chills. His INR also was found to be >10. He was given vitamin K as well as FFP.   Assessment & Plan:   Principal Problem:   Failure to thrive in adult Active Problems:   CHRONIC SYSTOLIC HEART FAILURE   Atrial fibrillation (HCC)   Status post CVA   Hypothyroidism (acquired)   Chronic kidney disease, stage 3   Weakness   Failure to thrive in adult -No acute etiology. He has chronic back pain, but denies any back pain on exam today. He does not have any focal deficits on exam but just complains of generalized weakness in all extremities. PT and OT evaluation has been ordered as well as dietitian consult. Continue IVF.  Supratherapeutic INR -CT scan of the head was done showed no evidence of bleeding. This has been reversed with vitamin K and FFP. Pharmacy consulted for Coumadin management.  Hypotension -IVF, could be secondary to dehydration as well as medications. Will check AM cortisol, hold home antihypertensives.   AKI on CKD stage 3 -Baseline Cr 1.6-1.8 -UA negative  -IVF   Chronic atrial fibrillation -CHA2DS2-VASc of at least 3. Digoxin level within normal limits. Will hold beta blocker, amiodarone, digoxin in setting of hypotension. Continue cardiac monitoring.   Chronic systolic CHF -LVEF is 40-45% in May 2017.  Currently appears to be compensated without symptoms or signs of decompensation. Monitor.   Hypothyroidism -TSH elevated, but T4 elevated as well. Continue home synthroid.    DVT prophylaxis: coumadin Code Status: Full Family Communication: wife at bedside Disposition Plan: pending further improvement   Consultants:   None  Procedures:   None  Antimicrobials:   None     Subjective: Patient remains slightly confused this morning, wife states that this is his baseline mentation. He does not have any acute complaints besides generalized weakness. He states that he is weak all over. I asked about his chronic back pain, patient currently denies any back pain.   Objective: Vitals:   11/05/16 2228 11/06/16 0606 11/06/16 0910 11/06/16 1255  BP: (!) 104/51 (!) 92/44 (!) 74/37 (!) 85/39  Pulse: (!) 59 60 (!) 59 60  Resp: 18 18    Temp: 97.7 F (36.5 C) 98.3 F (36.8 C) 98.5 F (36.9 C) 97.3 F (36.3 C)  TempSrc: Oral Oral Oral Oral  SpO2: 93% 98%  91%  Weight:      Height:        Intake/Output Summary (Last 24 hours) at 11/06/16 1301 Last data filed at 11/06/16 0641  Gross per 24 hour  Intake           512.33 ml  Output              150 ml  Net           362.33 ml  Filed Weights   11/05/16 1531 11/05/16 1945  Weight: 59 kg (130 lb) 60.3 kg (132 lb 14.4 oz)    Examination:  General exam: Appears calm and comfortable  Respiratory system: Clear to auscultation. Respiratory effort normal. Cardiovascular system: S1 & S2 heard, RRR. No JVD, murmurs, rubs, gallops or clicks. No pedal edema. Gastrointestinal system: Abdomen is nondistended, soft and nontender. No organomegaly or masses felt. Normal bowel sounds heard. Central nervous system: Alert and oriented. No focal neurological deficits. Patellar reflexes +2 bilaterally  Extremities: Symmetric 4/5 strength all extremities Skin: No rashes, lesions or ulcers Psychiatry: Judgement and insight appear normal. Mood &  affect appropriate.   Data Reviewed: I have personally reviewed following labs and imaging studies  CBC:  Recent Labs Lab 11/05/16 1319 11/06/16 0514  WBC 13.5* 11.9*  HGB 10.6* 9.9*  HCT 32.4* 29.6*  MCV 92.8 92.8  PLT 144* 121*   Basic Metabolic Panel:  Recent Labs Lab 11/05/16 1319 11/06/16 0514  NA 137 139  K 4.1 3.8  CL 108 107  CO2 19* 21*  GLUCOSE 96 104*  BUN 39* 42*  CREATININE 2.63* 2.67*  CALCIUM 9.1 9.0   GFR: Estimated Creatinine Clearance: 19.8 mL/min (by C-G formula based on SCr of 2.67 mg/dL (H)). Liver Function Tests: No results for input(s): AST, ALT, ALKPHOS, BILITOT, PROT, ALBUMIN in the last 168 hours. No results for input(s): LIPASE, AMYLASE in the last 168 hours. No results for input(s): AMMONIA in the last 168 hours. Coagulation Profile:  Recent Labs Lab 11/05/16 1319 11/05/16 1815 11/06/16 0514  INR >10.00* 2.36 2.10   Cardiac Enzymes: No results for input(s): CKTOTAL, CKMB, CKMBINDEX, TROPONINI in the last 168 hours. BNP (last 3 results) No results for input(s): PROBNP in the last 8760 hours. HbA1C: No results for input(s): HGBA1C in the last 72 hours. CBG:  Recent Labs Lab 11/05/16 1544  GLUCAP 99   Lipid Profile: No results for input(s): CHOL, HDL, LDLCALC, TRIG, CHOLHDL, LDLDIRECT in the last 72 hours. Thyroid Function Tests:  Recent Labs  11/06/16 0514 11/06/16 0836  TSH 5.557*  --   FREET4  --  1.35*   Anemia Panel: No results for input(s): VITAMINB12, FOLATE, FERRITIN, TIBC, IRON, RETICCTPCT in the last 72 hours. Sepsis Labs: No results for input(s): PROCALCITON, LATICACIDVEN in the last 168 hours.  No results found for this or any previous visit (from the past 240 hour(s)).     Radiology Studies: Dg Chest 2 View  Result Date: 11/05/2016 CLINICAL DATA:  Fatigue.  Prior pneumonia. EXAM: CHEST  2 VIEW COMPARISON:  May 12, 2016 FINDINGS: Mild opacity in the left lung base is stable and may represent  scarring or atelectasis. No focal infiltrate. Stable AICD. Stable cardiomegaly. The hila and mediastinum are unremarkable. No pneumothorax. IMPRESSION: Mild persistent opacity in the left lung base is unchanged. No acute focal infiltrate. Electronically Signed   By: Gerome Samavid  Williams III M.D   On: 11/05/2016 17:40   Ct Head Wo Contrast  Result Date: 11/05/2016 CLINICAL DATA:  Pt here with wife who reports pt has had increased fatigue and weakness X2 weeks. Wife states over the last two days pt has stopped walking and stopped feeding himself. Pt is alert and oriented but appears weak. EXAM: CT HEAD WITHOUT CONTRAST TECHNIQUE: Contiguous axial images were obtained from the base of the skull through the vertex without intravenous contrast. COMPARISON:  10/18/2004 FINDINGS: Brain: Remote infarcts are identified within the left and right basal ganglia and left  insular cortex. There is encephalomalacia of the left temporal lobe. There is no intra or extra-axial fluid collection or mass lesion. The basilar cisterns and ventricles have a normal appearance. There is no CT evidence for acute infarction or hemorrhage. There is central and cortical atrophy. Periventricular white matter changes are consistent with small vessel disease. Vascular: There is atherosclerotic calcification of the carotid siphons. Skull: Normal. Negative for fracture or focal lesion. Sinuses/Orbits: Opacity of the left maxillary sinus is new since the prior study. There is inward bowing of the lateral wall of the left maxillary sinus and the size of the sinus is smaller. The floor of the left orbit appears inferiorly displaced and findings raise the question of silent sinus syndrome. Other: None IMPRESSION: 1.  No evidence for acute  abnormality. 2. Remote infarcts within the basal ganglia and left insular cortex. 3. Atrophy and small vessel disease. 4. Question of silent sinus syndrome involving the left maxillary sinus. Question of facial  asymmetry and left enophthalmos. Electronically Signed   By: Norva Pavlov M.D.   On: 11/05/2016 16:32      Scheduled Meds: . ALPRAZolam  0.5 mg Oral BID  . amiodarone  200 mg Oral Daily  . calcitRIOL  0.25 mcg Oral Daily  . carvedilol  3.125 mg Oral BID  . digoxin  0.0625 mg Oral QODAY  . docusate sodium  100 mg Oral BID  . folic acid  1 mg Oral Daily  . levothyroxine  50 mcg Oral QAC breakfast  . multivitamin  1 tablet Oral Daily  . polyethylene glycol  17 g Oral Daily  . warfarin  1 mg Oral ONCE-1800  . Warfarin - Pharmacist Dosing Inpatient   Does not apply q1800   Continuous Infusions: . sodium chloride 100 mL/hr at 11/06/16 0800     LOS: 0 days    Time spent: 40 minutes   Noralee Stain, DO [facility] [email or web address] Password [email or web address] 11/06/2016, 1:01 PM

## 2016-11-06 NOTE — Care Management (Signed)
Attempted to deliver observation notice to patient. The patient is not currently in his room, no family present. Physicist, medical BSN CCM

## 2016-11-06 NOTE — Progress Notes (Signed)
   11/06/16 1715  Vitals  BP (!) 88/39  MAP (mmHg) (!) 51  BP Location Right Arm  BP Method Automatic  Patient Position (if appropriate) Lying   Reported to Dr Alvino Chapel. Waiting to hear back. Pt has been drowsy throughout day.  1725 Dr Alvino Chapel called nurse. She is ordering renal uls. Will continue to monitor and will make her aware if BP is less than 80 systolic. Dr Alvino Chapel says his BP is chronically low. She d/c'd xanax. Will continue to monitor pt.

## 2016-11-06 NOTE — Progress Notes (Signed)
BP 74/37 HR 59. Paged Dr Steele Berg. No new orders given at this time. Holding digoxin, coreg & amiodarone. Will keep close eye on BP.

## 2016-11-07 ENCOUNTER — Observation Stay (HOSPITAL_COMMUNITY): Payer: Medicare Other

## 2016-11-07 DIAGNOSIS — R627 Adult failure to thrive: Secondary | ICD-10-CM

## 2016-11-07 DIAGNOSIS — I252 Old myocardial infarction: Secondary | ICD-10-CM | POA: Diagnosis not present

## 2016-11-07 DIAGNOSIS — I5022 Chronic systolic (congestive) heart failure: Secondary | ICD-10-CM | POA: Diagnosis not present

## 2016-11-07 DIAGNOSIS — I130 Hypertensive heart and chronic kidney disease with heart failure and stage 1 through stage 4 chronic kidney disease, or unspecified chronic kidney disease: Secondary | ICD-10-CM | POA: Diagnosis present

## 2016-11-07 DIAGNOSIS — I482 Chronic atrial fibrillation: Secondary | ICD-10-CM | POA: Diagnosis present

## 2016-11-07 DIAGNOSIS — Z95810 Presence of automatic (implantable) cardiac defibrillator: Secondary | ICD-10-CM | POA: Diagnosis not present

## 2016-11-07 DIAGNOSIS — I959 Hypotension, unspecified: Secondary | ICD-10-CM | POA: Diagnosis present

## 2016-11-07 DIAGNOSIS — E039 Hypothyroidism, unspecified: Secondary | ICD-10-CM | POA: Diagnosis present

## 2016-11-07 DIAGNOSIS — N179 Acute kidney failure, unspecified: Secondary | ICD-10-CM

## 2016-11-07 DIAGNOSIS — I739 Peripheral vascular disease, unspecified: Secondary | ICD-10-CM | POA: Diagnosis present

## 2016-11-07 DIAGNOSIS — Z87891 Personal history of nicotine dependence: Secondary | ICD-10-CM | POA: Diagnosis not present

## 2016-11-07 DIAGNOSIS — R1313 Dysphagia, pharyngeal phase: Secondary | ICD-10-CM | POA: Diagnosis present

## 2016-11-07 DIAGNOSIS — I69951 Hemiplegia and hemiparesis following unspecified cerebrovascular disease affecting right dominant side: Secondary | ICD-10-CM | POA: Diagnosis not present

## 2016-11-07 DIAGNOSIS — Z66 Do not resuscitate: Secondary | ICD-10-CM | POA: Diagnosis not present

## 2016-11-07 DIAGNOSIS — K921 Melena: Secondary | ICD-10-CM | POA: Diagnosis not present

## 2016-11-07 DIAGNOSIS — Z823 Family history of stroke: Secondary | ICD-10-CM | POA: Diagnosis not present

## 2016-11-07 DIAGNOSIS — R531 Weakness: Secondary | ICD-10-CM | POA: Diagnosis present

## 2016-11-07 DIAGNOSIS — N183 Chronic kidney disease, stage 3 (moderate): Secondary | ICD-10-CM

## 2016-11-07 DIAGNOSIS — I2510 Atherosclerotic heart disease of native coronary artery without angina pectoris: Secondary | ICD-10-CM | POA: Diagnosis present

## 2016-11-07 DIAGNOSIS — Z951 Presence of aortocoronary bypass graft: Secondary | ICD-10-CM | POA: Diagnosis not present

## 2016-11-07 DIAGNOSIS — I481 Persistent atrial fibrillation: Secondary | ICD-10-CM

## 2016-11-07 DIAGNOSIS — R791 Abnormal coagulation profile: Secondary | ICD-10-CM | POA: Diagnosis present

## 2016-11-07 DIAGNOSIS — Z515 Encounter for palliative care: Secondary | ICD-10-CM | POA: Diagnosis not present

## 2016-11-07 DIAGNOSIS — M6250 Muscle wasting and atrophy, not elsewhere classified, unspecified site: Secondary | ICD-10-CM | POA: Diagnosis present

## 2016-11-07 DIAGNOSIS — E785 Hyperlipidemia, unspecified: Secondary | ICD-10-CM | POA: Diagnosis present

## 2016-11-07 DIAGNOSIS — I272 Pulmonary hypertension, unspecified: Secondary | ICD-10-CM | POA: Diagnosis present

## 2016-11-07 LAB — CBC
HCT: 29.8 % — ABNORMAL LOW (ref 39.0–52.0)
HEMOGLOBIN: 9.7 g/dL — AB (ref 13.0–17.0)
MCH: 30.2 pg (ref 26.0–34.0)
MCHC: 32.6 g/dL (ref 30.0–36.0)
MCV: 92.8 fL (ref 78.0–100.0)
Platelets: 120 10*3/uL — ABNORMAL LOW (ref 150–400)
RBC: 3.21 MIL/uL — ABNORMAL LOW (ref 4.22–5.81)
RDW: 18.2 % — AB (ref 11.5–15.5)
WBC: 14.8 10*3/uL — ABNORMAL HIGH (ref 4.0–10.5)

## 2016-11-07 LAB — HEPATIC FUNCTION PANEL
ALBUMIN: 2.2 g/dL — AB (ref 3.5–5.0)
ALK PHOS: 117 U/L (ref 38–126)
ALT: 39 U/L (ref 17–63)
AST: 73 U/L — AB (ref 15–41)
Bilirubin, Direct: 1.2 mg/dL — ABNORMAL HIGH (ref 0.1–0.5)
Indirect Bilirubin: 2.6 mg/dL — ABNORMAL HIGH (ref 0.3–0.9)
TOTAL PROTEIN: 6.1 g/dL — AB (ref 6.5–8.1)
Total Bilirubin: 3.8 mg/dL — ABNORMAL HIGH (ref 0.3–1.2)

## 2016-11-07 LAB — BASIC METABOLIC PANEL
Anion gap: 8 (ref 5–15)
BUN: 49 mg/dL — AB (ref 6–20)
CALCIUM: 8.6 mg/dL — AB (ref 8.9–10.3)
CO2: 20 mmol/L — AB (ref 22–32)
CREATININE: 2.69 mg/dL — AB (ref 0.61–1.24)
Chloride: 110 mmol/L (ref 101–111)
GFR calc Af Amer: 25 mL/min — ABNORMAL LOW (ref >60)
GFR, EST NON AFRICAN AMERICAN: 21 mL/min — AB (ref >60)
GLUCOSE: 101 mg/dL — AB (ref 65–99)
Potassium: 3.9 mmol/L (ref 3.5–5.1)
Sodium: 138 mmol/L (ref 135–145)

## 2016-11-07 LAB — PREPARE FRESH FROZEN PLASMA
UNIT DIVISION: 0
UNIT DIVISION: 0
Unit division: 0
Unit division: 0

## 2016-11-07 LAB — URINE CULTURE: Culture: NO GROWTH

## 2016-11-07 LAB — CORTISOL-AM, BLOOD: Cortisol - AM: 27.7 ug/dL — ABNORMAL HIGH (ref 6.7–22.6)

## 2016-11-07 LAB — PROTIME-INR
INR: 1.81
PROTHROMBIN TIME: 21.2 s — AB (ref 11.4–15.2)

## 2016-11-07 LAB — HEMOGLOBIN A1C
HEMOGLOBIN A1C: 4.8 % (ref 4.8–5.6)
MEAN PLASMA GLUCOSE: 91 mg/dL

## 2016-11-07 LAB — SEDIMENTATION RATE: SED RATE: 56 mm/h — AB (ref 0–16)

## 2016-11-07 LAB — CK: Total CK: 148 U/L (ref 49–397)

## 2016-11-07 MED ORDER — WARFARIN 0.5 MG HALF TABLET
0.5000 mg | ORAL_TABLET | Freq: Once | ORAL | Status: DC
  Filled 2016-11-07: qty 1

## 2016-11-07 NOTE — Consult Note (Signed)
Cardiology Consult    Patient ID: [name] [name] MRN: [id number], DOB/AGE: 11-Dec-1940   Admit date: 11/05/2016 Date of Consult: 11/07/2016  Primary Physician: Pamelia Hoit, MD Reason for Consult: CHF, Afib Primary Cardiologist: Dr. [name] Requesting Provider: Dr. Susie Cassette  Patient Profile    [name] [name] is a 75 year old male with a past medical history of CAD s/p CABG in 2001 (LIMA-LAD, SVG - first OM, and SVG - PDA), chronic atrial fibrillation on Coumadin, ischemic cardiomyopathy (EF 40-45%) s/p BiV ICD revised in 2013, and CKD. Admitted on 11/05/16 with weakness, INR was >10.   History of Present Illness    [name] [name] wife tells me that the patient has had worsening weakness for the past month, bust especially the last week. He had been using a cane to help him walk throughout the house, but in the past week he has been so weak that he requires a wheelchair to move throughout the house. Mr. [name] denies chest pain, does feel SOB at rest. He had been using his home oxygen only as needed, but has used it continually in the past week.   He was last admitted from 05/05/16 to 05/12/16  with acute respiratory failure related to a streptococcal PNA. EF 40-45% by Echo with severe pulmonary HTN. TFTs were abnormal with elevated Free T4 and TSH felt to be most likely sick euthyroid. Coreg dose reduced due to hypotension. Seen in ED later in June with L4 compression fracture after falling out of his golf cart.   EKG this admission shows V paced rhythm, his creatinine is 2.69, GFR is 21. His baseline appears to be 1.6-1.8. His INR at admission was >10, this has resolved with FFP and vitamin K, INR is now 1.8. AST is 68.   Past Medical History   Past Medical History:  Diagnosis Date  . Anxiety   . Atrial fibrillation (HCC)    chronic  . Cardiomyopathy, ischemic    EF of 30-35%  . CHF (congestive heart failure) (HCC)   . CKD (chronic kidney disease) stage 3, GFR 30-59 ml/min   .  Coronary artery disease   . Dyslipidemia   . Hypertension   . Myocardial infarction January 2001    History of anteroseptal myocardial infarction presenting with left bundle-branch block  . Pacemaker   . Peripheral vascular disease (HCC)   . Status post CVA   . Thyroid disease    hypo,due to amiodarone therapy,resolved  . Tobacco abuse    History of 2 pack per day tobacco habituation  . VT (ventricular tachycardia) (HCC) aug of 2010   removed old ICD and replaced with biventricular ICD    Past Surgical History:  Procedure Laterality Date  . CARDIAC CATHETERIZATION  2001   PTCA  . CARDIAC CATHETERIZATION  2005   patent grafts  . cataracts  2006   right eye  . CHOLECYSTECTOMY    . CORONARY ARTERY BYPASS GRAFT  2001   x 4  . FEMORAL BYPASS     fem-fem bpg, right fem pop bpg  . icd  07/15/2009   implant Dr. Ladona Ridgel  . IMPLANTABLE CARDIOVERTER DEFIBRILLATOR (ICD) GENERATOR CHANGE N/A 11/28/2012   Procedure: ICD GENERATOR CHANGE;  Surgeon: Marinus Maw, MD;  Location: [facility] CATH LAB;  Service: Cardiovascular;  Laterality: N/A;     Allergies  Allergies  Allergen Reactions  . Nitroglycerin Other (See Comments)    Became severely hypotensive  . Ace Inhibitors Other (See Comments)  Listed as intolerance - Unknown Reaction per previous records.    Inpatient Medications    . calcitRIOL  0.25 mcg Oral Daily  . docusate sodium  100 mg Oral BID  . folic acid  1 mg Oral Daily  . levothyroxine  50 mcg Oral QAC breakfast  . multivitamin  1 tablet Oral Daily  . polyethylene glycol  17 g Oral Daily  . warfarin  0.5 mg Oral ONCE-1800  . Warfarin - Pharmacist Dosing Inpatient   Does not apply q1800    Family History    Family History  Problem Relation Age of Onset  . Heart disease Mother   . Stroke Mother   . Stroke Father   . Aneurysm Brother     Social History    Social History   Social History  . Marital status: Married    Spouse name: N/A  . Number of children: 2    . Years of education: N/A   Occupational History  . Psychologist, prison and probation services Retired   Social History Main Topics  . Smoking status: Former Smoker    Quit date: 09/30/2000  . Smokeless tobacco: Never Used  . Alcohol use No  . Drug use: No  . Sexual activity: Not on file   Other Topics Concern  . Not on file   Social History Narrative  . No narrative on file     Review of Systems    General:  No chills, fever, night sweats or weight changes.  Cardiovascular:  No chest pain, +dyspnea on exertion, edema, +orthopnea, palpitations, paroxysmal nocturnal dyspnea. Dermatological: No rash, lesions/masses Respiratory: No cough, dyspnea Urologic: No hematuria, dysuria Abdominal:   No nausea, vomiting, diarrhea, bright red blood per rectum, melena, or hematemesis Neurologic:  No visual changes, wkns, changes in mental status. All other systems reviewed and are otherwise negative except as noted above.  Physical Exam    Blood pressure (!) 95/40, pulse (!) 59, temperature 98.4 F (36.9 C), temperature source Oral, resp. rate 16, height 5\' 6"  (1.676 m), weight 132 lb 14.4 oz (60.3 kg), SpO2 91 %.  General: Pleasant, elderly male in NAD Psych: Normal affect. Neuro: Alert and oriented X 3. Moves all extremities spontaneously. HEENT: Normal  Neck: Supple without bruits, + JVD. Lungs:  Resp regular and unlabored, diminished in bases Heart: RRR no s3, s4, or murmurs. Abdomen: Soft, non-tender, non-distended, BS + x 4.  Extremities: No clubbing, cyanosis or edema. DP/PT/Radials 2+ and equal bilaterally.  Labs     Lab Results  Component Value Date   WBC 14.8 (H) 11/07/2016   HGB 9.7 (L) 11/07/2016   HCT 29.8 (L) 11/07/2016   MCV 92.8 11/07/2016   PLT 120 (L) 11/07/2016    Recent Labs Lab 11/07/16 0542  NA 138  K 3.9  CL 110  CO2 20*  BUN 49*  CREATININE 2.69*  CALCIUM 8.6*  GLUCOSE 101*   Lab Results  Component Value Date   CHOL 78 10/22/2016   HDL 15 (L) 10/22/2016    LDLCALC 38 10/22/2016   TRIG 124 10/22/2016   No results found for: [facility]   Radiology Studies    Dg Chest 2 View  Result Date: 11/05/2016 CLINICAL DATA:  Fatigue.  Prior pneumonia. EXAM: CHEST  2 VIEW COMPARISON:  May 12, 2016 FINDINGS: Mild opacity in the left lung base is stable and may represent scarring or atelectasis. No focal infiltrate. Stable AICD. Stable cardiomegaly. The hila and mediastinum are unremarkable. No pneumothorax. IMPRESSION: Mild persistent  opacity in the left lung base is unchanged. No acute focal infiltrate. Electronically Signed   By: Gerome Samavid  Williams III M.D   On: 11/05/2016 17:40   Ct Head Wo Contrast  Result Date: 11/05/2016 CLINICAL DATA:  Pt here with wife who reports pt has had increased fatigue and weakness X2 weeks. Wife states over the last two days pt has stopped walking and stopped feeding himself. Pt is alert and oriented but appears weak. EXAM: CT HEAD WITHOUT CONTRAST TECHNIQUE: Contiguous axial images were obtained from the base of the skull through the vertex without intravenous contrast. COMPARISON:  10/18/2004 FINDINGS: Brain: Remote infarcts are identified within the left and right basal ganglia and left insular cortex. There is encephalomalacia of the left temporal lobe. There is no intra or extra-axial fluid collection or mass lesion. The basilar cisterns and ventricles have a normal appearance. There is no CT evidence for acute infarction or hemorrhage. There is central and cortical atrophy. Periventricular white matter changes are consistent with small vessel disease. Vascular: There is atherosclerotic calcification of the carotid siphons. Skull: Normal. Negative for fracture or focal lesion. Sinuses/Orbits: Opacity of the left maxillary sinus is new since the prior study. There is inward bowing of the lateral wall of the left maxillary sinus and the size of the sinus is smaller. The floor of the left orbit appears inferiorly displaced and findings  raise the question of silent sinus syndrome. Other: None IMPRESSION: 1.  No evidence for acute  abnormality. 2. Remote infarcts within the basal ganglia and left insular cortex. 3. Atrophy and small vessel disease. 4. Question of silent sinus syndrome involving the left maxillary sinus. Question of facial asymmetry and left enophthalmos. Electronically Signed   By: Norva PavlovElizabeth  Brown M.D.   On: 11/05/2016 16:32   [place] Renal  Result Date: 11/06/2016 CLINICAL DATA:  Chronic kidney disease stage 3 EXAM: RENAL / URINARY TRACT ULTRASOUND COMPLETE COMPARISON:  None. FINDINGS: Right Kidney: Length: 8.6 cm. Echogenicity within normal limits. No mass or hydronephrosis visualized. Severe cortical atrophy is noted. Left Kidney: Length: 8.6 cm. Echogenicity within normal limits. No mass or hydronephrosis visualized. Severe cortical atrophy is noted. Bladder: Appears normal for degree of bladder distention. IMPRESSION: There is severe cortical atrophy bilaterally. No hydronephrosis or mass. Electronically Signed   By: Jolaine ClickArthur  Hoss M.D.   On: 11/06/2016 19:46    EKG & Cardiac Imaging    EKG: V paced  Echocardiogram: 05/08/16 Study Conclusions  - Left ventricle: The cavity size was normal. Systolic function was   mildly to moderately reduced. The estimated ejection fraction was   in the range of 40% to 45%. There is akinesis of the   apicalanteroseptal myocardium. - Mitral valve: Calcified annulus. There was mild regurgitation. - Left atrium: The atrium was mildly dilated. - Right atrium: The atrium was mildly dilated. - Pulmonary arteries: Systolic pressure was severely increased. PA   peak pressure: 74 mm Hg (S).  Impressions:  - EF is improved when compared to prior ECHO.  Assessment & Plan    1. Chronic atrial fibrillation: On Coumadin, INR has normalized.   This patients CHA2DS2-VASc Score and unadjusted Ischemic Stroke Rate (% per year) is equal to 9.7 % stroke rate/year from a score of 6 Above  score calculated as 1 point each if present [CHF, HTN, DM, Vascular=MI/PAD/Aortic Plaque, Age if 65-74, or Male], 2 points each if present [Age > 75, or Stroke/TIA/TE]  2. History of VT: s/p BiV ICD, patient on Amiodarone which  is on hold as he is pending swallow evaluation and patient is hypotensive.   3. Failure to thrive: Unclear etiology, patient reports good oral intake. Speech has seen him and recommends barium swallow which is scheduled for later today.   4. Chronic systolic CHF: Appears compensated. All meds on hold due to hypotension.   5. Acute on chronic kidney disease: No ACE-I or ARB.   Patient would benefit from goals of care discussion with palliative care.     Signed, Little Ishikawa, NP 11/07/2016, 11:57 AM Pager: [phone] Patient seen and examined and history reviewed. Agree with above findings and plan. Brandom is well known to me. Seen 10 days ago. Noted increased weakness. Arranged home PT. Since then developed further weakness. Was eating well up until 2 days ago. Since then noted poor po intake and cough with eating. More profound weakness. Brought to ER. INR >10. No bleeding. Corrected with FFP. INR was 1.9 10 days ago.  On exam he is very frail. Weak. Somnolent.  Lungs clear. CV RRR without gallop or murmur Abd. Soft NT No edema.  Significant muscle wasting.  Patient with progressive decline in functional status. Renal function is worse. Part of this may be due to dehydration. Unclear why INR went up so much without antibiotics unless this is dilutional. Concerned about aspiration. All oral meds on hold. Agree with hydration. For complete speech/swallowing evaluation today. If he does not rally we will need to make some tough decisions soon. May need to get Palliative care involved. I will discuss with family more tomorrow after we see how he responds to hydration. While all this may be involutional we need to consider other possible causes of his progressive  weakness. ? Myasthenia.  Corrisa Gibby [name], MDFACC 11/07/2016 2:05 PM

## 2016-11-07 NOTE — Progress Notes (Signed)
ANTICOAGULATION CONSULT NOTE - Follow Up Consult  Pharmacy Consult for warfarin Indication: atrial fibrillation  Allergies  Allergen Reactions  . Nitroglycerin Other (See Comments)    Became severely hypotensive  . Ace Inhibitors Other (See Comments)    Listed as intolerance - Unknown Reaction per previous records.    Patient Measurements: Height: 5\' 6"  (167.6 cm) Weight: 132 lb 14.4 oz (60.3 kg) IBW/kg (Calculated) : 63.8  Vital Signs: Temp: 98.4 F (36.9 C) (11/29 0730) Temp Source: Oral (11/29 0730) BP: 95/40 (11/29 0730) Pulse Rate: 59 (11/29 0730)  Labs:  Recent Labs  11/05/16 1319 11/05/16 1815 11/06/16 0514 11/07/16 0542  HGB 10.6*  --  9.9* 9.7*  HCT 32.4*  --  29.6* 29.8*  PLT 144*  --  121* 120*  LABPROT >90.0* 26.2* 23.9* 21.2*  INR >10.00* 2.36 2.10 1.81  CREATININE 2.63*  --  2.67* 2.69*    Estimated Creatinine Clearance: 19.6 mL/min (by C-G formula based on SCr of 2.69 mg/dL (H)).  Assessment:  75 yo M brought to ED with increasing weakness for the past week. Pt is on warfarin PTA for AFib and reportedly had an initial INR >10 on 11/27 (error?), No bleeding noted.   Pt received Vit K 5mg  PO and 1 unit of FFP.  Repeat lab was 2.36 (drawn before Vit K given) and pt had been stable on this home dose for last 3 months.  He had taken warfarin 0.5 mg at home in the morning of 11/27, and another 0.5 mg was given at [facility] in the pm, due to concern that INR would continue to fall after Vit K and FFP.     INR 1.81 today, subtherapeutic. RN reports BRB in stool overnight.CBC stable. PO intake thin liquids. May take a few days to overcome vitamin K dose.  Home warfarin regimen: 0.5 mg PO daily except none on Wednesdays  Goal of Therapy:  INR 2-3 Monitor platelets by anticoagulation protocol: Yes   Plan:  Warfarin 0.5 mg PO x 1 Consider bridging while INR subtherapeutic Monitor daily INR, CBC, clinical course, s/sx of bleed, PO intake, DDI   Thank you for  allowing [place] to participate in this patients care. Signe Colt, PharmD Pager: [phone] 11/07/2016,10:40 AM

## 2016-11-07 NOTE — Progress Notes (Addendum)
PROGRESS NOTE    [name] [name]  ZHG:[id number] DOB: [birth year] DOA: 11/05/2016 PCP: [name] Seller, MD     Brief Narrative:  [name] [name] is a 75 y.o. male with medical history significant of atrial fibrillation, on Coumadin for anticoagulation, chronic CHF with LVEF of 30-35%. Patient has history of CVA with resultant mild right-sided hemiparesis. Patient brought to the hospital by his wife and his granddaughter as he is been very weak for the past several days. He is eating less, getting up less, and he reports that he is weak to the point he couldn't get out of the bed today so they brought him for further evaluation. Denies any fever or chills. His INR also was found to be >10. He was given vitamin K as well as FFP.   Assessment & Plan:   Principal Problem:   Failure to thrive in adult Active Problems:   CHRONIC SYSTOLIC HEART FAILURE   Atrial fibrillation (HCC)   Status post CVA   Hypothyroidism (acquired)   Chronic kidney disease, stage 3   Weakness   Bright red blood per rectum Documentation from RN last night that the patient had a bloody bowel movement Patient is on anticoagulation and INR is currently 1.81 Continue to monitor for ongoing bleeding, doubt he will be a candidate for invasive workup if the patient continues to have GI bleeding Baseline hemoglobin 10.8>9.7 Request cardiology's input to determine if the patient would be a candidate for long-term anticoagulation, overall prognosis from a cardiac standpoint   Failure to thrive in adult -No acute etiology. He has chronic back pain, but denies any back pain on exam today. He does not have any focal deficits on exam but just complains of generalized weakness in all extremities. PT and OT evaluation has been ordered as well as dietitian consult. Continue IVF.  Supratherapeutic INR -CT scan of the head was done showed no evidence of bleeding. This has been reversed with vitamin K and FFP. Pharmacy  consulted for Coumadin management. INR now 1.81  Hypotension -IVF, could be secondary to dehydration as well as medications. Cortisol within normal limits   AKI on CKD stage 3, cardiorenal syndrome  -Baseline Cr 1.6-1.8. Presented with a creatinine of 2.21 which has been trending up slowly -UA negative  -IVF   Chronic atrial fibrillation, followed by [name] [name] -CHA2DS2-VASc of at least 3. Digoxin level within normal limits. Resume, amiodarone, digoxin if heart rate/blood pressure allows . Continue cardiac monitoring.   Chronic systolic CHF/ischemic cardiomyopathy/CABG in 2001 -LVEF is 40-45% in May 2017. Currently appears to be compensated without symptoms or signs of decompensation. Monitor.  Marland Kitchen Avoid ACE inhibitor/ARB due to to renal insufficiency.    Hypothyroidism -TSH elevated, but T4 elevated as well. Continue home synthroid.   history of ventricular tachycardia and has a biventricular ICD revised in December 2013. Resume amiodarone if heart rate and blood pressure allows Will consult cardiology for further recommendations, overall prognosis which seems to be [name] [name]  June with L4 compression fracture after falling out of his golf cart Now mostly in the wheelchair  Remote CVA, progressive weakness Continue Coumadin Has dysphagia ,MBS today, cxr to r/o pna due to aspiration CT head No evidence for acute  abnormality Myasthenia , will check a panel, check CK,ESR   DVT prophylaxis: coumadin Code Status: Full Family Communication: wife at bedside Disposition Plan: Cardiology requested to comment on prognosis, help with above medical issues   Consultants:   Cardiology consult  Procedures:  None  Antimicrobials:   None     Subjective: Apparently had a bloody bowel movement last night, continues to have generalized weakness. Denied shortness of breath   Objective: Vitals:   11/06/16 1630 11/06/16 1715 11/06/16 2355 11/07/16 0730  BP: (!) 85/45  (!) 88/39 (!) 97/46 (!) 95/40  Pulse:   (!) 59 (!) 59  Resp:   18 16  Temp:   97.3 F (36.3 C) 98.4 F (36.9 C)  TempSrc:   Oral Oral  SpO2:   97% 91%  Weight:      Height:        Intake/Output Summary (Last 24 hours) at 11/07/16 1049 Last data filed at 11/06/16 2355  Gross per 24 hour  Intake               30 ml  Output              200 ml  Net             -170 ml   Filed Weights   11/05/16 1531 11/05/16 1945  Weight: 59 kg (130 lb) 60.3 kg (132 lb 14.4 oz)    Examination:  General exam: Appears calm and comfortable  Respiratory system: Clear to auscultation. Respiratory effort normal. Cardiovascular system: S1 & S2 heard, RRR. No JVD, murmurs, rubs, gallops or clicks. No pedal edema. Gastrointestinal system: Abdomen is nondistended, soft and nontender. No organomegaly or masses felt. Normal bowel sounds heard. Central nervous system: Alert and oriented. No focal neurological deficits. Patellar reflexes +2 bilaterally  Extremities: Symmetric 4/5 strength all extremities Skin: No rashes, lesions or ulcers Psychiatry: Judgement and insight appear normal. Mood & affect appropriate.   Data Reviewed: I have personally reviewed following labs and imaging studies  CBC:  Recent Labs Lab 11/05/16 1319 11/06/16 0514 11/07/16 0542  WBC 13.5* 11.9* 14.8*  HGB 10.6* 9.9* 9.7*  HCT 32.4* 29.6* 29.8*  MCV 92.8 92.8 92.8  PLT 144* 121* 751*   Basic Metabolic Panel:  Recent Labs Lab 11/05/16 1319 11/06/16 0514 11/07/16 0542  NA 137 139 138  K 4.1 3.8 3.9  CL 108 107 110  CO2 19* 21* 20*  GLUCOSE 96 104* 101*  BUN 39* 42* 49*  CREATININE 2.63* 2.67* 2.69*  CALCIUM 9.1 9.0 8.6*   GFR: Estimated Creatinine Clearance: 19.6 mL/min (by C-G formula based on SCr of 2.69 mg/dL (H)). Liver Function Tests: No results for input(s): AST, ALT, ALKPHOS, BILITOT, PROT, ALBUMIN in the last 168 hours. No results for input(s): LIPASE, AMYLASE in the last 168 hours. No results for  input(s): AMMONIA in the last 168 hours. Coagulation Profile:  Recent Labs Lab 11/05/16 1319 11/05/16 1815 11/06/16 0514 11/07/16 0542  INR >10.00* 2.36 2.10 1.81   Cardiac Enzymes: No results for input(s): CKTOTAL, CKMB, CKMBINDEX, TROPONINI in the last 168 hours. BNP (last 3 results) No results for input(s): PROBNP in the last 8760 hours. HbA1C:  Recent Labs  11/06/16 0514  HGBA1C 4.8   CBG:  Recent Labs Lab 11/05/16 1544  GLUCAP 99   Lipid Profile: No results for input(s): CHOL, HDL, LDLCALC, TRIG, CHOLHDL, LDLDIRECT in the last 72 hours. Thyroid Function Tests:  Recent Labs  11/06/16 0514 11/06/16 0836  TSH 5.557*  --   FREET4  --  1.35*   Anemia Panel: No results for input(s): VITAMINB12, FOLATE, FERRITIN, TIBC, IRON, RETICCTPCT in the last 72 hours. Sepsis Labs: No results for input(s): PROCALCITON, LATICACIDVEN in the last 168 hours.  Recent Results (from the past 240 hour(s))  Urine culture     Status: None   Collection Time: 11/05/16  8:11 PM  Result Value Ref Range Status   Specimen Description URINE, RANDOM  Final   Special Requests NONE  Final   Culture NO GROWTH  Final   Report Status 11/07/2016 FINAL  Final       Radiology Studies: Dg Chest 2 View  Result Date: 11/05/2016 CLINICAL DATA:  Fatigue.  Prior pneumonia. EXAM: CHEST  2 VIEW COMPARISON:  May 12, 2016 FINDINGS: Mild opacity in the left lung base is stable and may represent scarring or atelectasis. No focal infiltrate. Stable AICD. Stable cardiomegaly. The hila and mediastinum are unremarkable. No pneumothorax. IMPRESSION: Mild persistent opacity in the left lung base is unchanged. No acute focal infiltrate. Electronically Signed   By: Dorise Bullion III M.D   On: 11/05/2016 17:40   Ct Head Wo Contrast  Result Date: 11/05/2016 CLINICAL DATA:  Pt here with wife who reports pt has had increased fatigue and weakness X2 weeks. Wife states over the last two days pt has stopped walking  and stopped feeding himself. Pt is alert and oriented but appears weak. EXAM: CT HEAD WITHOUT CONTRAST TECHNIQUE: Contiguous axial images were obtained from the base of the skull through the vertex without intravenous contrast. COMPARISON:  10/18/2004 FINDINGS: Brain: Remote infarcts are identified within the left and right basal ganglia and left insular cortex. There is encephalomalacia of the left temporal lobe. There is no intra or extra-axial fluid collection or mass lesion. The basilar cisterns and ventricles have a normal appearance. There is no CT evidence for acute infarction or hemorrhage. There is central and cortical atrophy. Periventricular white matter changes are consistent with small vessel disease. Vascular: There is atherosclerotic calcification of the carotid siphons. Skull: Normal. Negative for fracture or focal lesion. Sinuses/Orbits: Opacity of the left maxillary sinus is new since the prior study. There is inward bowing of the lateral wall of the left maxillary sinus and the size of the sinus is smaller. The floor of the left orbit appears inferiorly displaced and findings raise the question of silent sinus syndrome. Other: None IMPRESSION: 1.  No evidence for acute  abnormality. 2. Remote infarcts within the basal ganglia and left insular cortex. 3. Atrophy and small vessel disease. 4. Question of silent sinus syndrome involving the left maxillary sinus. Question of facial asymmetry and left enophthalmos. Electronically Signed   By: Nolon Nations M.D.   On: 11/05/2016 16:32   US Renal  Result Date: 11/06/2016 CLINICAL DATA:  Chronic kidney disease stage 3 EXAM: RENAL / URINARY TRACT ULTRASOUND COMPLETE COMPARISON:  None. FINDINGS: Right Kidney: Length: 8.6 cm. Echogenicity within normal limits. No mass or hydronephrosis visualized. Severe cortical atrophy is noted. Left Kidney: Length: 8.6 cm. Echogenicity within normal limits. No mass or hydronephrosis visualized. Severe cortical  atrophy is noted. Bladder: Appears normal for degree of bladder distention. IMPRESSION: There is severe cortical atrophy bilaterally. No hydronephrosis or mass. Electronically Signed   By: Marybelle Killings M.D.   On: 11/06/2016 19:46      Scheduled Meds: . calcitRIOL  0.25 mcg Oral Daily  . docusate sodium  100 mg Oral BID  . folic acid  1 mg Oral Daily  . levothyroxine  50 mcg Oral QAC breakfast  . multivitamin  1 tablet Oral Daily  . polyethylene glycol  17 g Oral Daily  . Warfarin - Pharmacist Dosing Inpatient   Does not  apply q1800   Continuous Infusions: . sodium chloride 100 mL/hr at 11/06/16 0800     LOS: 0 days    Time spent: 40 minutes   Reyne Dumas, MD [facility] [email or web address] Password [facility] 11/07/2016, 10:49 AM

## 2016-11-07 NOTE — Evaluation (Addendum)
Clinical/Bedside Swallow Evaluation Patient Details  Name: [name] [name] MRN: [id number] Date of Birth: 26-Apr-1941  Today's Date: 11/07/2016 Time: SLP Start Time (ACUTE ONLY): 1115 SLP Stop Time (ACUTE ONLY): 1215 SLP Time Calculation (min) (ACUTE ONLY): 60 min  Past Medical History:  Past Medical History:  Diagnosis Date  . Anxiety   . Atrial fibrillation (HCC)    chronic  . Cardiomyopathy, ischemic    EF of 30-35%  . CHF (congestive heart failure) (HCC)   . CKD (chronic kidney disease) stage 3, GFR 30-59 ml/min   . Coronary artery disease   . Dyslipidemia   . Hypertension   . Myocardial infarction January 2001    History of anteroseptal myocardial infarction presenting with left bundle-branch block  . Pacemaker   . Peripheral vascular disease (HCC)   . Status post CVA   . Thyroid disease    hypo,due to amiodarone therapy,resolved  . Tobacco abuse    History of 2 pack per day tobacco habituation  . VT (ventricular tachycardia) (HCC) aug of 2010   removed old ICD and replaced with biventricular ICD   Past Surgical History:  Past Surgical History:  Procedure Laterality Date  . CARDIAC CATHETERIZATION  2001   PTCA  . CARDIAC CATHETERIZATION  2005   patent grafts  . cataracts  2006   right eye  . CHOLECYSTECTOMY    . CORONARY ARTERY BYPASS GRAFT  2001   x 4  . FEMORAL BYPASS     fem-fem bpg, right fem pop bpg  . icd  07/15/2009   implant Dr. Ladona Ridgel  . IMPLANTABLE CARDIOVERTER DEFIBRILLATOR (ICD) GENERATOR CHANGE N/A 11/28/2012   Procedure: ICD GENERATOR CHANGE;  Surgeon: Marinus Maw, MD;  Location: [facility] CATH LAB;  Service: Cardiovascular;  Laterality: N/A;   HPI:  [name] [name] a 75 y.o.malewith medical history significant of atrial fibrillation, on Coumadin for anticoagulation, chronic CHF with LVEF of 30-35%. Patient has history of CVA with resultant mild right-sided hemiparesis. Patient brought to the hospital by his wife and his granddaughter as he is  been very weak for the past several days. He is eating less, getting up less, and he reports that he is weak to the point he couldn't get out of the bed today so they brought him for further evaluation. Denies any fever or chills. His INR also was found to be >10. He was given vitamin K as well as FFP. OT notes indicate pt provided with small sip of water via straw in upright position at bed level; pt noted to have wet cough following sip of water. Pt and wife report this is new since hospitalization. Renal imaging revealed severe cortical atrophy bilaterally. No hydronephrosis or mass. MRI didn't reveal any acute abnormality. Remote infarcts within the basal ganglia and left insular cortex and atrophy/small vessel disease. 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 Pt hospitalized on 05/09/2016 acute on chronic hypoxic respiratory failure secondary to community-acquired pneumonia and decompensated CHF. Bedside Swallow Evaluation provided on 05/09/2016 and regular with thin liquid diet recommended.    Assessment / Plan / Recommendation Clinical Impression  Pt presents with severe sensorimotor pharyngeal phase dysphagia concerning for delayed swallow initiation, wet voice and immediate coughing with trials of ice chips, thin liquid via spoon and nectar thick liquids via spoon. Pt demonstrated audible wetness throughout all liquid trials. Pt attempted double swallow but was not effective  in preventing immediate coughing. Pt appeared to have a timely swallow initation and complete oral clearing with trials of puree. Pt also has decreased respiratory support, weak cough and periods of decreased vocal intensity (almost aphonic) possibly d/t rapid fatigue. Given the above, pt appears at a high risk for aspiration and ST recommends instrumental study prior to making diet recommendations. MD, NP, pt and family  agreeable. Will make diet recommendations following study.     Aspiration Risk  Severe aspiration risk;Risk for inadequate nutrition/hydration    Diet Recommendation  (Will make recommendation after MBS)        Other  Recommendations Oral Care Recommendations: Oral care QID   Follow up Recommendations Skilled Nursing facility      Frequency and Duration min 2x/week  2 weeks       Prognosis   Guarded     Swallow Study   General Date of Onset: 11/05/16 HPI: [name] [name] a 75 y.o.malewith medical history significant of atrial fibrillation, on Coumadin for anticoagulation, chronic CHF with LVEF of 30-35%. Patient has history of CVA with resultant mild right-sided hemiparesis. Patient brought to the hospital by his wife and his granddaughter as he is been very weak for the past several days. He is eating less, getting up less, and he reports that he is weak to the point he couldn't get out of the bed today so they brought him for further evaluation. Denies any fever or chills. His INR also was found to be >10. He was given vitamin K as well as FFP. OT notes indicate pt provided with small sip of water via straw in upright position at bed level; pt noted to have wet cough following sip of water. Pt and wife report this is new since hospitalization. Renal imaging revealed severe cortical atrophy bilaterally. No hydronephrosis or mass. MRI didn't reveal any acute abnormality. Remote infarcts within the basal ganglia and left insular cortex and atrophy/small vessel disease. 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 Pt hospitalized on 05/09/2016 acute on chronic hypoxic respiratory failure secondary to community-acquired pneumonia and decompensated CHF. Bedside Swallow Evaluation provided on 05/09/2016 and regular with thin liquid diet recommended.  Type of Study: Bedside Swallow  Evaluation Previous Swallow Assessment: 05/09/2016 BSE - recommend regular with thin liquids Diet Prior to this Study: Regular;Thin liquids Temperature Spikes Noted: No Respiratory Status: Nasal cannula (4liters) History of Recent Intubation: No Behavior/Cognition: Alert;Cooperative;Pleasant mood Oral Cavity Assessment: Within Functional Limits Oral Care Completed by SLP: No Oral Cavity - Dentition: Dentures, top;Dentures, bottom Vision: Functional for self-feeding Self-Feeding Abilities: Needs assist Patient Positioning: Upright in bed Baseline Vocal Quality: Normal Volitional Cough: Weak;Wet;Congested Volitional Swallow: Able to elicit    Oral/Motor/Sensory Function Overall Oral Motor/Sensory Function: Within functional limits   Ice Chips Ice chips: Impaired Presentation: Spoon Oral Phase Functional Implications: Prolonged oral transit Pharyngeal Phase Impairments: Suspected delayed Swallow;Wet Vocal Quality;Cough - Immediate   Thin Liquid Thin Liquid: Impaired Presentation: Spoon Pharyngeal  Phase Impairments: Suspected delayed Swallow;Wet Vocal Quality;Cough - Immediate    Nectar Thick Nectar Thick Liquid: Impaired Presentation: Spoon Pharyngeal Phase Impairments: Suspected delayed Swallow;Wet Vocal Quality;Cough - Immediate Other Comments:  (Audible pharyngeal residue, double swallow uneffective)   Honey Thick Honey Thick Liquid: Not tested   Puree Puree: Within functional limits Presentation: Spoon   Solid   GO   Solid: Not tested    Functional Assessment Tool Used:  Skilled Clinical observation Functional Limitations: Swallowing Swallow Current Status [phone]): At least 80 percent but less than 100 percent impaired, limited or restricted Swallow Goal Status [phone]): At least 40 percent but less than 60 percent impaired, limited or restricted Swallow Discharge Status [phone]): At least 1 percent but less than 20 percent impaired, limited or restricted   Jessiah Steinhart B.  Dreama Saa, M.S., CCC-SLP Speech-Language Pathologist [phone] Zunaira Lamy Dreama Saa 11/07/2016,12:32 PM

## 2016-11-07 NOTE — Progress Notes (Signed)
Modified Barium Swallow Progress Note  Patient Details  Name: MOE YONAMINE MRN: [id number] Date of Birth: 04-Nov-1941  Today's Date: 11/07/2016  Modified Barium Swallow completed.  Full report located under Chart Review in the Imaging Section.  Brief recommendations include the following:  Clinical Impression  Pt has severe sensorimotor dysphagia c/b delayed swallow initiation across all consistencies and pharyngeal weakness. This results in aspiration of thin liquids as well as penetration of significant pharyngeal residue from nectar thick liquids and puree consistencies, none of which were spontaneously cleared. Pt with difficulty consistently implementing compensatory strategies. At times, pt was able to increase airway protection with cough and double swallow.  Given significant dysphagia and decreased ability to reliably use compensatory swallow strategies, pt is at high risk for aspiration with all PO intake. Education provided to MD on results of study and aspiration risk. Would recommend to remain NPO pending further POC discussion. Should pt/family choose to accept the risks of aspiration, a full nectar thick liquid diet with use of aforementioned strategies may be considered.   Swallow Evaluation Recommendations       SLP Diet Recommendations: NPO       Medication Administration: Via alternative means               Oral Care Recommendations: Oral care QID   Other Recommendations: Remove water pitcher   Anie Juniel B. Dreama Saa, M.S., CCC-SLP Speech-Language Pathologist [phone] Claudia Alvizo 11/07/2016,4:39 PM

## 2016-11-07 NOTE — Evaluation (Signed)
Occupational Therapy Evaluation Patient Details Name: [name] GriffithDonnie J [name] MRN: [id number] DOB: 10/24/1941 Today's Date: 11/07/2016    History of Present Illness 75 y.o.malewith medical history significant of atrial fibrillation, on Coumadin for anticoagulation, chronic CHF with LVEF of 30-35%. Patient has history of CVA with resultant mild right-sided hemiparesis. Patient brought to the hospital by his wife and his granddaughter as he is been very weak for the past several days--to the point he couldn't get out of the bed. His INR also was found to be >10. +hypotension   Clinical Impression   Pts wife reports she has been assisting with ADL recently. Currently pt requires max assist for ADL. Pt tolerated sitting EOB ~5 minutes without c/o dizziness and performed sit to stand from EOB x1 with mod assist. Pt with significant weakness limiting standing tolerance. Recommending SNF for follow up to maximize independence and safety with ADL and functional mobility prior to return home. Pt would benefit from continued skilled OT to address established goals.  Of note: pt provided with small sip of water via straw in upright position at bed level; pt noted to have wet cough following sip of water. Pt and wife report this is new since hospitalization. Recommend SLP consult; RN notified.    Follow Up Recommendations  SNF;Supervision/Assistance - 24 hour    Equipment Recommendations  Tub/shower bench    Recommendations for Other Services Speech consult     Precautions / Restrictions Precautions Precautions: Fall Restrictions Weight Bearing Restrictions: No      Mobility Bed Mobility Overal bed mobility: Needs Assistance Bed Mobility: Supine to Sit;Sit to Supine     Supine to sit: Max assist Sit to supine: Max assist   General bed mobility comments: Pt able to assist with bringing LEs to EOB with cues for sequencing but required assist with trunk elevation and scooting hips to EOB. Pt  required assist for controlled decent from sit to supine but able to assist with bringing legs up to bed. Cues for sequencing and hand placement throughout. Total assist +2 to scoot up in bed.  Transfers Overall transfer level: Needs assistance Equipment used: Rolling walker (2 wheeled) Transfers: Sit to/from Stand Sit to Stand: Mod assist         General transfer comment: Mod assist for sit to stand from EOB x1. Pt only able to maintain standing for a few seconds due to LE weakness. No c/o of dizziness.    Balance Overall balance assessment: Needs assistance Sitting-balance support: Feet supported;Bilateral upper extremity supported Sitting balance-Leahy Scale: Poor     Standing balance support: Bilateral upper extremity supported Standing balance-Leahy Scale: Poor                              ADL Overall ADL's : Needs assistance/impaired   Eating/Feeding Details (indicate cue type and reason): Pt given small sip of water through straw in upright position at bed level; pt with wet cough immideately following. Pt and wife reports this is new for him; recommend SLP consult. Grooming: Moderate assistance;Sitting   Upper Body Bathing: Maximal assistance;Sitting   Lower Body Bathing: Maximal assistance;Bed level   Upper Body Dressing : Maximal assistance;Sitting   Lower Body Dressing: Maximal assistance                 General ADL Comments: Pt able to sit EOB unsupported ~5 minutes without c/o of dizziness. Pt able to perform sit to stand from  EOB x1 with mod assist but unable to sustain standing due to LE weakness.     Vision Additional Comments: Needs further assessment.   Perception     Praxis      Pertinent Vitals/Pain Pain Assessment: No/denies pain     Hand Dominance     Extremity/Trunk Assessment Upper Extremity Assessment Upper Extremity Assessment: RUE deficits/detail;LUE deficits/detail RUE Deficits / Details: Grossly 3-3+/5. Poor grip  strength, R<L.  LUE Deficits / Details: Grossly 3+/5. Poor grip strength but stronger than R side.   Lower Extremity Assessment Lower Extremity Assessment: Defer to PT evaluation   Cervical / Trunk Assessment Cervical / Trunk Assessment: Other exceptions;Kyphotic Cervical / Trunk Exceptions: cachectic   Communication Communication Communication: Expressive difficulties (mumbling)   Cognition Arousal/Alertness: Awake/alert Behavior During Therapy: WFL for tasks assessed/performed                       General Comments       Exercises       Shoulder Instructions      Home Living Family/patient expects to be discharged to:: Private residence Living Arrangements: Spouse/significant other Available Help at Discharge: Family;Available 24 hours/day Type of Home: House       Home Layout: One level     Bathroom Shower/Tub: Tub/shower unit Shower/tub characteristics: Curtain Firefighter: Standard     Home Equipment: Environmental consultant - 4 wheels;Cane - single point;Wheelchair - Manufacturing systems engineer;Toilet riser          Prior Functioning/Environment Level of Independence: Needs assistance  Gait / Transfers Assistance Needed: ~5 days ago pt ambulated with cane with Modif I and has had some falls.  ADL's / Homemaking Assistance Needed: Wife assisting with ADLs            OT Problem List: Decreased strength;Decreased activity tolerance;Impaired balance (sitting and/or standing);Decreased cognition;Decreased safety awareness;Decreased knowledge of use of DME or AE;Decreased knowledge of precautions   OT Treatment/Interventions: Self-care/ADL training;Therapeutic exercise;Neuromuscular education;Energy conservation;Therapeutic activities;Patient/family education;Balance training    OT Goals(Current goals can be found in the care plan section) Acute Rehab OT Goals Patient Stated Goal: wants to go home OT Goal Formulation: With patient/family Time For Goal Achievement:  11/21/16 Potential to Achieve Goals: Good ADL Goals Pt Will Perform Eating: with set-up;sitting Pt Will Perform Grooming: with set-up;sitting Pt Will Transfer to Toilet: with min assist;stand pivot transfer;bedside commode Pt Will Perform Toileting - Clothing Manipulation and hygiene: with min assist;sit to/from stand  OT Frequency: Min 2X/week   Barriers to D/C:            Co-evaluation              End of Session Equipment Utilized During Treatment: Gait belt;Rolling walker;Oxygen Nurse Communication: Mobility status;Other (comment) (SLP consult needed)  Activity Tolerance: Patient limited by fatigue Patient left: in bed;with call bell/phone within reach;with bed alarm set;with family/visitor present   Time: 4627-0350 OT Time Calculation (min): 24 min Charges:  OT General Charges $OT Visit: 1 Procedure OT Evaluation $OT Eval Moderate Complexity: 1 Procedure OT Treatments $Self Care/Home Management : 8-22 mins G-Codes: OT G-codes **NOT FOR INPATIENT CLASS** Functional Assessment Tool Used: Clinical judgement Functional Limitation: Self care Self Care Current Status (K9381): At least 60 percent but less than 80 percent impaired, limited or restricted Self Care Goal Status (W2993): At least 40 percent but less than 60 percent impaired, limited or restricted   Gaye Alken M.S., OTR/L Pager: [phone]  11/07/2016, 9:09 AM

## 2016-11-08 ENCOUNTER — Telehealth: Payer: Self-pay | Admitting: Cardiology

## 2016-11-08 LAB — CK: Total CK: 223 U/L (ref 49–397)

## 2016-11-08 LAB — COMPREHENSIVE METABOLIC PANEL
ALBUMIN: 2 g/dL — AB (ref 3.5–5.0)
ALK PHOS: 105 U/L (ref 38–126)
ALT: 34 U/L (ref 17–63)
ANION GAP: 8 (ref 5–15)
AST: 61 U/L — AB (ref 15–41)
BILIRUBIN TOTAL: 3.4 mg/dL — AB (ref 0.3–1.2)
BUN: 55 mg/dL — AB (ref 6–20)
CALCIUM: 8.5 mg/dL — AB (ref 8.9–10.3)
CO2: 20 mmol/L — AB (ref 22–32)
CREATININE: 2.85 mg/dL — AB (ref 0.61–1.24)
Chloride: 113 mmol/L — ABNORMAL HIGH (ref 101–111)
GFR calc Af Amer: 23 mL/min — ABNORMAL LOW (ref >60)
GFR calc non Af Amer: 20 mL/min — ABNORMAL LOW (ref >60)
GLUCOSE: 83 mg/dL (ref 65–99)
Potassium: 4.1 mmol/L (ref 3.5–5.1)
SODIUM: 141 mmol/L (ref 135–145)
TOTAL PROTEIN: 5.9 g/dL — AB (ref 6.5–8.1)

## 2016-11-08 LAB — CBC
HEMATOCRIT: 28.8 % — AB (ref 39.0–52.0)
HEMOGLOBIN: 9.4 g/dL — AB (ref 13.0–17.0)
MCH: 30.3 pg (ref 26.0–34.0)
MCHC: 32.6 g/dL (ref 30.0–36.0)
MCV: 92.9 fL (ref 78.0–100.0)
Platelets: 126 10*3/uL — ABNORMAL LOW (ref 150–400)
RBC: 3.1 MIL/uL — AB (ref 4.22–5.81)
RDW: 18 % — ABNORMAL HIGH (ref 11.5–15.5)
WBC: 13 10*3/uL — AB (ref 4.0–10.5)

## 2016-11-08 LAB — PROTIME-INR
INR: 1.84
PROTHROMBIN TIME: 21.5 s — AB (ref 11.4–15.2)

## 2016-11-08 MED ORDER — LORAZEPAM 2 MG/ML IJ SOLN
1.0000 mg | Freq: Four times a day (QID) | INTRAMUSCULAR | Status: DC | PRN
  Administered 2016-11-08: 1 mg via INTRAVENOUS
  Filled 2016-11-08: qty 1

## 2016-11-08 MED ORDER — MORPHINE SULFATE (PF) 2 MG/ML IV SOLN: 2.0000 mg | INTRAVENOUS | Status: DC | PRN

## 2016-11-08 MED ORDER — ENOXAPARIN SODIUM 40 MG/0.4ML ~~LOC~~ SOLN
30.0000 mg | Freq: Every day | SUBCUTANEOUS | Status: DC
  Administered 2016-11-08 – 2016-11-09 (×2): 30 mg via SUBCUTANEOUS
  Filled 2016-11-08 (×2): qty 0.4

## 2016-11-08 NOTE — Progress Notes (Signed)
[facility] [id number]  [facility] RN Hospital Liaison  Notified by Marylene Land [facility] of family request for [facility] services at home after discharge.  Chart and patient information currently under review to confirm hospice eligibility.  Spoke with patient's wife and daughter, Angelique Blonder, at bedside.  Education initiated related to hospice philosophy, services and team approach to care.  Family verbalized understanding of the information provided.  Per discussion, plan is for discharge home on 12/1, by personal vehicle.  Patient's wife states she will bring portable oxygen that she has in the home for discharge.    Please send signed, completed DNR form home with patient. Patient will need prescriptions for discharge comfort medications.  **Would strongly recommend med list be simplified, as patient's swallowing is poor.  I explained to the family we can't give him medications intravenously.**Family states they would like to speak with Cardiology regarding medication simplification, and possible discontinuation of Coumadin.    DME needs discussed.  Family requested hospital bed, over bed table and 3 in 1. [facility] Dme representative will order this for delivery to:  [address], [place], [place].    [facility] aware of the above.    Completed discharge summary will need to be faxed to:  [phone] when final. Please notify [facility] when patient is ready to leave unit at discharge:  Call [phone]  [facility] information and contact numbers have been given to wife during visit. Above information shared with Marylene Land, [facility].  Please call with questions.  Kristine Garbe, RN, BSN [phone]

## 2016-11-08 NOTE — Progress Notes (Signed)
Speech Language Pathology Treatment: Dysphagia  Patient Details Name: [name] [name] MRN: [id number] DOB: [birth year] Today's Date: 11/08/2016 Time: 1032-1050 SLP Time Calculation (min) (ACUTE ONLY): 18 min  Assessment / Plan / Recommendation Clinical Impression  SLP offered minimal Po trials after oral care to assess ability to follow compensatory strategies suggested during MBS. Pt alert and participatory, follows all commands to the best of his ability. Oral care completed and ice chips given one at time with verbal cues for effortful swallow x2 and hard cough. Pt was able to complete these, but signs of aspiration persisted, involuntary coughing, wet voice.   This appears as a chronic, progressive dysphagia, unlikely to improve significantly over time despite therapy or general medical improvement. Any PO given would present a high risk of aspiration. Pt is desperate for water, begging me to given him sips. I asked him if he liked the ice he said "No, I LOVE it." I don't think pt would tolerate NPO status and long term alternative nutrition would not decrease his risk of aspiration as he is likely aspirating secretions as well. Strongly favor comfort measures and comfort feeding.     HPI HPI: [name] Priess Friddleis a 75 y.o.malewith medical history significant of atrial fibrillation, on Coumadin for anticoagulation, chronic CHF with LVEF of 30-35%. Patient has history of CVA with resultant mild right-sided hemiparesis. Patient brought to the hospital by his wife and his granddaughter as he is been very weak for the past several days. He is eating less, getting up less, and he reports that he is weak to the point he couldn't get out of the bed today so they brought him for further evaluation. Denies any fever or chills. His INR also was found to be >10. He was given vitamin K as well as FFP. OT notes indicate pt provided with small sip of water via straw in upright position at bed level; pt noted to  have wet cough following sip of water. Pt and wife report this is new since hospitalization. Renal imaging revealed severe cortical atrophy bilaterally. No hydronephrosis or mass. MRI didn't reveal any acute abnormality. Remote infarcts within the basal ganglia and left insular cortex and atrophy/small vessel disease. 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 Pt hospitalized on 05/09/2016 acute on chronic hypoxic respiratory failure secondary to community-acquired pneumonia and decompensated CHF. Bedside Swallow Evaluation provided on 05/09/2016 and regular with thin liquid diet recommended.       SLP Plan  Continue with current plan of care     Recommendations  Diet recommendations: NPO                Plan: Continue with current plan of care       GO               [facility], [place] CCC-SLP [phone]  [name] [name] 11/08/2016, 10:56 AM

## 2016-11-08 NOTE — Progress Notes (Signed)
CM received consult: Hospice referral, anticipate discharge home with hospice tomorrow  CM spoke with pt/ wife regarding d/c plan : home hospice.  CM provided pt/wife with choice list and they selected [facility]. CM called [phone] and spoke with [facility] @ the referral center and referral was made for home hospice. Evette stated [facility] liaison will contact CM regarding home hospice referral. Gae Gallop RN,BSN,CM [phone]

## 2016-11-08 NOTE — Progress Notes (Signed)
Occupational Therapy Treatment Patient Details Name: [name] GriffithDonnie J Eliot MRN: [id number] DOB: December 16, 1940 Today's Date: 11/08/2016    History of present illness 75 y.o.malewith medical history significant of atrial fibrillation, on Coumadin for anticoagulation, chronic CHF with LVEF of 30-35%. Patient has history of CVA with resultant mild right-sided hemiparesis. Patient brought to the hospital by his wife and his granddaughter as he is been very weak for the past several days--to the point he couldn't get out of the bed. His INR also was found to be >10. +hypotension   OT comments  Pt able to perform stand pivot from EOB to chair with min assist +2 today. Pt found to be incontinent of bowel upon arrival; total assist for peri care at bed level. Pt requesting water throughout session despite explanation of current NPO status. MD discussing palliative care/home with hospice with pt and family. Will continue to follow acutely.   Follow Up Recommendations  SNF;Supervision/Assistance - 24 hour    Equipment Recommendations  Tub/shower bench    Recommendations for Other Services      Precautions / Restrictions Precautions Precautions: Fall Restrictions Weight Bearing Restrictions: No       Mobility Bed Mobility Overal bed mobility: Needs Assistance Bed Mobility: Rolling;Supine to Sit;Sit to Supine Rolling: Min assist   Supine to sit: Min assist Sit to supine: Supervision   General bed mobility comments: Pt sat edge of bed and PTA observed BM.  Pt then required back to supine for positioning to perform perianal care.  Pt able to roll and sit with decreased assistance.    Transfers Overall transfer level: Needs assistance Equipment used: 2 person hand held assist Transfers: Sit to/from UGI CorporationStand;Stand Pivot Transfers Sit to Stand: Min assist;+2 physical assistance Stand pivot transfers: Mod assist;+2 physical assistance       General transfer comment: Cues for pushing through B  LEs, increased weight bearing and muscle activation observed.  Pt with slight posterior lean.      Balance Overall balance assessment: Needs assistance Sitting-balance support: No upper extremity supported;Feet supported Sitting balance-Leahy Scale: Fair     Standing balance support: Bilateral upper extremity supported Standing balance-Leahy Scale: Poor                     ADL Overall ADL's : Needs assistance/impaired Eating/Feeding: NPO               Upper Body Dressing : Moderate assistance;Sitting Upper Body Dressing Details (indicate cue type and reason): to doff/don hospital gown     Toilet Transfer: Moderate assistance;+2 for physical assistance;Stand-pivot;BSC Toilet Transfer Details (indicate cue type and reason): Simulated by stand pivot from EOB to chair Toileting- Clothing Manipulation and Hygiene: Total assistance;+2 for physical assistance;Bed level Toileting - Clothing Manipulation Details (indicate cue type and reason): Pt found to be incontinent of bowel at start of session. Pt required total assist for peri care at bed level.     Functional mobility during ADLs: Moderate assistance;+2 for physical assistance (2 person hand held assist) General ADL Comments: Pt more conversive this session but reports he is going to die today. Wanted to get OOB to chair with therapy.      Vision                     Perception     Praxis      Cognition   Behavior During Therapy: [facility]ura[facility] NashvilleWFL for tasks assessed/performed Overall Cognitive Status: Impaired/Different from baseline Area of Impairment: Safety/judgement;Problem solving  Safety/Judgement: Decreased awareness of safety;Decreased awareness of deficits   Problem Solving: Slow processing;Decreased initiation;Requires verbal cues;Requires tactile cues;Difficulty sequencing General Comments: Pt asking for water throghout session despite explaning NPO status.    Extremity/Trunk Assessment                Exercises    Shoulder Instructions       General Comments      Pertinent Vitals/ Pain       Pain Assessment: No/denies pain  Home Living                                          Prior Functioning/Environment              Frequency  Min 2X/week        Progress Toward Goals  OT Goals(current goals can now be found in the care plan section)  Progress towards OT goals: Progressing toward goals  Acute Rehab OT Goals Patient Stated Goal: wants to go home OT Goal Formulation: With patient/family  Plan Discharge plan remains appropriate    Co-evaluation    PT/OT/SLP Co-Evaluation/Treatment: Yes Reason for Co-Treatment: For patient/therapist safety PT goals addressed during session: Mobility/safety with mobility;Balance OT goals addressed during session: ADL's and self-care      End of Session Equipment Utilized During Treatment: Gait belt;Oxygen   Activity Tolerance Patient tolerated treatment well   Patient Left in chair;with call bell/phone within reach;with chair alarm set;with family/visitor present   Nurse Communication          Time: 4656-8127 OT Time Calculation (min): 28 min  Charges: OT General Charges $OT Visit: 1 Procedure OT Treatments $Self Care/Home Management : 8-22 mins  Gaye Alken M.S., OTR/L Pager: [phone]  11/08/2016, 1:51 PM

## 2016-11-08 NOTE — Progress Notes (Signed)
PROGRESS NOTE    [name] [name]  YBW:[id number] DOB: 1941/06/23 DOA: 11/05/2016 PCP: Woody Seller, MD     Brief Narrative:  [name] [name] is a 75 y.o. male with medical history significant of atrial fibrillation, on Coumadin for anticoagulation, chronic CHF with LVEF of 30-35%. Patient has history of CVA with resultant mild right-sided hemiparesis. Patient brought to the hospital by his wife and his granddaughter as he is been very weak for the past several days. He is eating less, getting up less, and he reports that he is weak to the point he couldn't get out of the bed today so they brought him for further evaluation. Denies any fever or chills. His INR also was found to be >10. He was given vitamin K as well as FFP.   Assessment & Plan:   Principal Problem:   Failure to thrive in adult Active Problems:   CHRONIC SYSTOLIC HEART FAILURE   Atrial fibrillation (HCC)   Status post CVA   Hypothyroidism (acquired)   Chronic kidney disease, stage 3   Weakness   AKI (acute kidney injury) ([place])   Bright red blood per rectum, without recurrence Documentation from RN 11/27 that the patient had a bloody bowel movement Patient is on anticoagulation and INR is currently 1.81 Continue to monitor for ongoing bleeding, doubt he will be a candidate for invasive workup if the patient continues to have GI bleeding Baseline hemoglobin 10.8>9.7, hemoglobin stable Coumadin discontinued given poor prognosis   Failure to thrive in adult -No acute etiology. He has chronic back pain, but denies any back pain on exam today. He does not have any focal deficits on exam but just complains of generalized weakness in all extremities. No specific reason found for patient's decline, recommend that patient be discharged home with hospice Family is agreeable  Supratherapeutic INR -CT scan of the head was done showed no evidence of bleeding. This has been reversed with vitamin K and FFP.  INR  subtherapeutic. Patient has been taken off Coumadin by cardiology  Hypotension -IVF, could be secondary to dehydration as well as medications. Cortisol within normal limits   AKI on CKD stage 3, cardiorenal syndrome  -Baseline Cr 1.6-1.8. Presented with a creatinine of 2.21, creatinine continues to trend up -UA negative  Patient is not a candidate for hemodialysis  Chronic atrial fibrillation, followed by Dr. Peter Martinique -CHA2DS2-VASc of at least 3. Digoxin level within normal limits.  All medications including, amiodarone, and digoxin now discontinued  Chronic systolic CHF/ischemic cardiomyopathy/CABG in 2001 -LVEF is 40-45% in May 2017. Currently appears to be compensated without symptoms or signs of decompensation. Monitor.  Marland Kitchen Avoid ACE inhibitor/ARB due to to renal insufficiency.    Hypothyroidism -TSH elevated, but T4 elevated as well. Unable to take by mouth medications, synthroid on hold.   history of ventricular tachycardia and has a biventricular ICD revised in December 2013. Continues to be unable to take by mouth medications  June with L4 compression fracture after falling out of his golf cart Now mostly in the wheelchair  Remote CVA, progressive weakness/dysphagia Continue Coumadin Has dysphagia ,MBS, speech therapy recommendations, continue to recommend npo CT head No evidence for acute  abnormality Myasthenia , will check a panel, CK ESR within normal limits    DVT prophylaxis: coumadin Code Status: Full> DO NOT RESUSCITATE Family Communication: wife at bedside Disposition Plan: Cardiology feels that his prognosis is poor, recommends palliative care, likely home with hospice   Consultants:   Cardiology consult  Procedures:   None  Antimicrobials:   None     Subjective: Sitting up in the chair, on oxygen by nasal cannula, does not appear to be in any acute distress   Objective: Vitals:   11/07/16 0730 11/07/16 1417 11/07/16 2103 11/08/16  0500  BP: (!) 95/40 (!) 93/46 (!) 114/91 (!) 94/59  Pulse: (!) 59 (!) 58 (!) 58 60  Resp: '16 18 20 20  '$ Temp: 98.4 F (36.9 C)  97.8 F (36.6 C) 98.6 F (37 C)  TempSrc: Oral  Oral Oral  SpO2: 91% 94% 91% 96%  Weight:      Height:        Intake/Output Summary (Last 24 hours) at 11/08/16 1235 Last data filed at 11/08/16 0400  Gross per 24 hour  Intake          3984.42 ml  Output              750 ml  Net          3234.42 ml   Filed Weights   11/05/16 1531 11/05/16 1945  Weight: 59 kg (130 lb) 60.3 kg (132 lb 14.4 oz)    Examination:  General exam: Appears calm and comfortable  Respiratory system: Clear to auscultation. Respiratory effort normal. Cardiovascular system: S1 & S2 heard, RRR. No JVD, murmurs, rubs, gallops or clicks. No pedal edema. Gastrointestinal system: Abdomen is nondistended, soft and nontender. No organomegaly or masses felt. Normal bowel sounds heard. Central nervous system: Alert and oriented. No focal neurological deficits. Patellar reflexes +2 bilaterally  Extremities: Symmetric 4/5 strength all extremities Skin: No rashes, lesions or ulcers Psychiatry: Judgement and insight appear normal. Mood & affect appropriate.   Data Reviewed: I have personally reviewed following labs and imaging studies  CBC:  Recent Labs Lab 11/05/16 1319 11/06/16 0514 11/07/16 0542 11/08/16 0349  WBC 13.5* 11.9* 14.8* 13.0*  HGB 10.6* 9.9* 9.7* 9.4*  HCT 32.4* 29.6* 29.8* 28.8*  MCV 92.8 92.8 92.8 92.9  PLT 144* 121* 120* 696*   Basic Metabolic Panel:  Recent Labs Lab 11/05/16 1319 11/06/16 0514 11/07/16 0542 11/08/16 0349  NA 137 139 138 141  K 4.1 3.8 3.9 4.1  CL 108 107 110 113*  CO2 19* 21* 20* 20*  GLUCOSE 96 104* 101* 83  BUN 39* 42* 49* 55*  CREATININE 2.63* 2.67* 2.69* 2.85*  CALCIUM 9.1 9.0 8.6* 8.5*   GFR: Estimated Creatinine Clearance: 18.5 mL/min (by C-G formula based on SCr of 2.85 mg/dL (H)). Liver Function Tests:  Recent Labs Lab  11/07/16 1556 11/08/16 0349  AST 73* 61*  ALT 39 34  ALKPHOS 117 105  BILITOT 3.8* 3.4*  PROT 6.1* 5.9*  ALBUMIN 2.2* 2.0*   No results for input(s): LIPASE, AMYLASE in the last 168 hours. No results for input(s): AMMONIA in the last 168 hours. Coagulation Profile:  Recent Labs Lab 11/05/16 1319 11/05/16 1815 11/06/16 0514 11/07/16 0542 11/08/16 0349  INR >10.00* 2.36 2.10 1.81 1.84   Cardiac Enzymes:  Recent Labs Lab 11/07/16 1556 11/08/16 0349  CKTOTAL 148 223   BNP (last 3 results) No results for input(s): PROBNP in the last 8760 hours. HbA1C:  Recent Labs  11/06/16 0514  HGBA1C 4.8   CBG:  Recent Labs Lab 11/05/16 1544  GLUCAP 99   Lipid Profile: No results for input(s): CHOL, HDL, LDLCALC, TRIG, CHOLHDL, LDLDIRECT in the last 72 hours. Thyroid Function Tests:  Recent Labs  11/06/16 0514 11/06/16 0836  TSH 5.557*  --  FREET4  --  1.35*   Anemia Panel: No results for input(s): VITAMINB12, FOLATE, FERRITIN, TIBC, IRON, RETICCTPCT in the last 72 hours. Sepsis Labs: No results for input(s): PROCALCITON, LATICACIDVEN in the last 168 hours.  Recent Results (from the past 240 hour(s))  Urine culture     Status: None   Collection Time: 11/05/16  8:11 PM  Result Value Ref Range Status   Specimen Description URINE, RANDOM  Final   Special Requests NONE  Final   Culture NO GROWTH  Final   Report Status 11/07/2016 FINAL  Final       Radiology Studies: Dg Chest 1 View  Result Date: 11/07/2016 CLINICAL DATA:  Cough. EXAM: CHEST 1 VIEW COMPARISON:  11/05/2016 FINDINGS: Stable normal heart size. Status post CABG and biventricular ICD/ pacer implant. Chronic interstitial coarsening, likely interstitial lung disease. Lung volumes are normal and no honeycombing is noted. No evidence of superimposed pneumonia or edema. No effusion or pneumothorax. IMPRESSION: Chronic lung disease without acute superimposed finding. Electronically Signed   By: Monte Fantasia M.D.   On: 11/07/2016 15:53   US Renal  Result Date: 11/06/2016 CLINICAL DATA:  Chronic kidney disease stage 3 EXAM: RENAL / URINARY TRACT ULTRASOUND COMPLETE COMPARISON:  None. FINDINGS: Right Kidney: Length: 8.6 cm. Echogenicity within normal limits. No mass or hydronephrosis visualized. Severe cortical atrophy is noted. Left Kidney: Length: 8.6 cm. Echogenicity within normal limits. No mass or hydronephrosis visualized. Severe cortical atrophy is noted. Bladder: Appears normal for degree of bladder distention. IMPRESSION: There is severe cortical atrophy bilaterally. No hydronephrosis or mass. Electronically Signed   By: Marybelle Killings M.D.   On: 11/06/2016 19:46   Dg Swallowing Func-speech Pathology  Result Date: 11/07/2016 Objective Swallowing Evaluation: Type of Study: MBS-Modified Barium Swallow Study Patient Details Name: HARPREET POMPEY MRN: [id number] Date of Birth: 08/22/1941 Today's Date: 11/07/2016 Time: SLP Start Time (ACUTE ONLY): 1520-SLP Stop Time (ACUTE ONLY): 1550 SLP Time Calculation (min) (ACUTE ONLY): 30 min Past Medical History: Past Medical History: Diagnosis Date . Anxiety  . Atrial fibrillation (HCC)   chronic . Cardiomyopathy, ischemic   EF of 30-35% . CHF (congestive heart failure) ([place])  . CKD (chronic kidney disease) stage 3, GFR 30-59 ml/min  . Coronary artery disease  . Dyslipidemia  . Hypertension  . Myocardial infarction January 2001   History of anteroseptal myocardial infarction presenting with left bundle-branch block . Pacemaker  . Peripheral vascular disease ([place])  . Status post CVA  . Thyroid disease   hypo,due to amiodarone therapy,resolved . Tobacco abuse   History of 2 pack per day tobacco habituation . VT (ventricular tachycardia) ([place]) aug of 2010  removed old ICD and replaced with biventricular ICD Past Surgical History: Past Surgical History: Procedure Laterality Date . CARDIAC CATHETERIZATION  2001  PTCA . CARDIAC CATHETERIZATION  2005  patent grafts .  cataracts  2006  right eye . CHOLECYSTECTOMY   . CORONARY ARTERY BYPASS GRAFT  2001  x 4 . FEMORAL BYPASS    fem-fem bpg, right fem pop bpg . icd  07/15/2009  implant Dr. Lovena Le . IMPLANTABLE CARDIOVERTER DEFIBRILLATOR (ICD) GENERATOR CHANGE N/A 11/28/2012  Procedure: ICD GENERATOR CHANGE;  Surgeon: Evans Lance, MD;  Location: [facility] CATH LAB;  Service: Cardiovascular;  Laterality: N/A; HPI: Ruhaan J Friddleis a 75 y.o.malewith medical history significant of atrial fibrillation, on Coumadin for anticoagulation, chronic CHF with LVEF of 30-35%. Patient has history of CVA with resultant mild right-sided hemiparesis. Patient brought to the  hospital by his wife and his granddaughter as he is been very weak for the past several days. He is eating less, getting up less, and he reports that he is weak to the point he couldn't get out of the bed today so they brought him for further evaluation. Denies any fever or chills. His INR also was found to be >10. He was given vitamin K as well as FFP. OT notes indicate pt provided with small sip of water via straw in upright position at bed level; pt noted to have wet cough following sip of water. Pt and wife report this is new since hospitalization. Renal imaging revealed severe cortical atrophy bilaterally. No hydronephrosis or mass. MRI didn't reveal any acute abnormality. Remote infarcts within the basal ganglia and left insular cortex and atrophy/small vessel disease. 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 Pt hospitalized on 05/09/2016 acute on chronic hypoxic respiratory failure secondary to community-acquired pneumonia and decompensated CHF. Bedside Swallow Evaluation provided on 05/09/2016 and regular with thin liquid diet recommended.  Subjective: alert, pleasant Assessment / Plan / Recommendation CHL IP CLINICAL IMPRESSIONS 11/07/2016 Therapy Diagnosis Severe  pharyngeal phase dysphagia Clinical Impression Pt has severe sensorimotor dysphagia c/b delayed swallow initiation across all consistencies and pharyngeal weakness. This results in aspiration of thin liquids as well as penetration of significant pharyngeal residue from nectar thick liquids and puree consistencies, none of which were spontaneously cleared. Pt with difficulty consistently implementing compensatory strategies. At times, pt was able to increase airway protection with cough and double swallow.  Given significant dysphagia and decreased ability to reliably use compensatory swallow strategies, pt is at high risk for aspiration with all PO intake. Education provided to MD on results of study and aspiration risk. Would recommend to remain NPO pending further [place] discussion. Should pt/family choose to accept the risks of aspiration, a full nectar thick liquid diet with use of aforementioned strategies may be considered. Impact on safety and function Severe aspiration risk;Risk for inadequate nutrition/hydration   CHL IP TREATMENT RECOMMENDATION 11/07/2016 Treatment Recommendations Therapy as outlined in treatment plan below   Prognosis 11/07/2016 Prognosis for Safe Diet Advancement Guarded Barriers to Reach Goals Cognitive deficits;Severity of deficits Barriers/Prognosis Comment -- CHL IP DIET RECOMMENDATION 11/07/2016 SLP Diet Recommendations NPO Liquid Administration via -- Medication Administration Via alternative means Compensations -- Postural Changes --   CHL IP OTHER RECOMMENDATIONS 11/07/2016 Recommended Consults -- Oral Care Recommendations Oral care QID Other Recommendations Remove water pitcher   CHL IP FOLLOW UP RECOMMENDATIONS 11/07/2016 Follow up Recommendations Skilled Nursing facility   [facility] IP FREQUENCY AND DURATION 11/07/2016 Speech Therapy Frequency (ACUTE ONLY) min 2x/week Treatment Duration 2 weeks      CHL IP ORAL PHASE 11/07/2016 Oral Phase WFL Oral - Pudding Teaspoon -- Oral - Pudding  Cup -- Oral - Honey Teaspoon -- Oral - Honey Cup -- Oral - Nectar Teaspoon -- Oral - Nectar Cup -- Oral - Nectar Straw -- Oral - Thin Teaspoon -- Oral - Thin Cup -- Oral - Thin Straw -- Oral - Puree -- Oral - Mech Soft -- Oral - Regular -- Oral - Multi-Consistency -- Oral - Pill -- Oral Phase - Comment --  CHL IP PHARYNGEAL PHASE 11/07/2016 Pharyngeal Phase Impaired Pharyngeal- Pudding Teaspoon -- Pharyngeal -- Pharyngeal- Pudding Cup -- Pharyngeal -- Pharyngeal- Honey Teaspoon -- Pharyngeal -- Pharyngeal- Honey Cup -- Pharyngeal -- Pharyngeal- Nectar Teaspoon Delayed  swallow initiation-vallecula;Penetration/Apiration after swallow;Pharyngeal residue - valleculae;Pharyngeal residue - posterior pharnyx;Pharyngeal residue - pyriform;Pharyngeal residue - cp segment;Trace aspiration;Reduced pharyngeal peristalsis;Reduced tongue base retraction;Compensatory strategies attempted (with notebox) Pharyngeal Material enters airway, remains ABOVE vocal cords and not ejected out Pharyngeal- Nectar Cup Delayed swallow initiation-vallecula;Delayed swallow initiation-pyriform sinuses;Penetration/Apiration after swallow;Trace aspiration;Pharyngeal residue - valleculae;Pharyngeal residue - pyriform;Pharyngeal residue - posterior pharnyx;Pharyngeal residue - cp segment;Reduced pharyngeal peristalsis;Reduced tongue base retraction;Compensatory strategies attempted (with notebox) Pharyngeal Material enters airway, remains ABOVE vocal cords and not ejected out Pharyngeal- Nectar Straw -- Pharyngeal -- Pharyngeal- Thin Teaspoon Delayed swallow initiation-pyriform sinuses;Reduced airway/laryngeal closure;Significant aspiration (Amount);Penetration/Aspiration before swallow Pharyngeal Material enters airway, passes BELOW cords without attempt by patient to eject out (silent aspiration) Pharyngeal- Thin Cup -- Pharyngeal -- Pharyngeal- Thin Straw -- Pharyngeal -- Pharyngeal- Puree Delayed swallow initiation-vallecula;Pharyngeal residue -  valleculae;Pharyngeal residue - pyriform;Pharyngeal residue - posterior pharnyx;Pharyngeal residue - cp segment;Reduced tongue base retraction;Reduced pharyngeal peristalsis Pharyngeal -- Pharyngeal- Mechanical Soft -- Pharyngeal -- Pharyngeal- Regular -- Pharyngeal -- Pharyngeal- Multi-consistency -- Pharyngeal -- Pharyngeal- Pill -- Pharyngeal -- Pharyngeal Comment --  CHL IP CERVICAL ESOPHAGEAL PHASE 11/07/2016 Cervical Esophageal Phase WFL Pudding Teaspoon -- Pudding Cup -- Honey Teaspoon -- Honey Cup -- Nectar Teaspoon -- Nectar Cup -- Nectar Straw -- Thin Teaspoon -- Thin Cup -- Thin Straw -- Puree -- Mechanical Soft -- Regular -- Multi-consistency -- Pill -- Cervical Esophageal Comment -- CHL IP GO 11/07/2016 Functional Assessment Tool Used Skilled Clinical observation Functional Limitations Swallowing Swallow Current Status (Z3007) CM Swallow Goal Status (M2263) CK Swallow Discharge Status (F3545) CI Motor Speech Current Status (G2563) (None) Motor Speech Goal Status (S9373) (None) Motor Speech Goal Status (S2876) (None) Spoken Language Comprehension Current Status (O1157) (None) Spoken Language Comprehension Goal Status (W6203) (None) Spoken Language Comprehension Discharge Status (T5974) (None) Spoken Language Expression Current Status (B6384) (None) Spoken Language Expression Goal Status (T3646) (None) Spoken Language Expression Discharge Status [phone]) (None) Attention Current Status (Y2482) (None) Attention Goal Status (N0037) (None) Attention Discharge Status (C4888) (None) Memory Current Status (B1694) (None) Memory Goal Status (H0388) (None) Memory Discharge Status (E2800) (None) Voice Current Status (L4917) (None) Voice Goal Status (H1505) (None) Voice Discharge Status (W9794) (None) Other Speech-Language Pathology Functional Limitation [phone]) (None) Other Speech-Language Pathology Functional Limitation Goal Status (V3748) (None) Other Speech-Language Pathology Functional Limitation Discharge  Status [phone]) (None) Happi Overton 11/07/2016, 4:37 PM                 Scheduled Meds: . enoxaparin  30 mg Subcutaneous Daily   Continuous Infusions: . sodium chloride 75 mL/hr at 11/08/16 1019     LOS: 1 day    Time spent: 40 minutes   Reyne Dumas, MD [facility] [email or web address] Password [facility] 11/08/2016, 12:35 PM

## 2016-11-08 NOTE — Progress Notes (Signed)
Physical Therapy Treatment Patient Details Name: [name] [name] MRN: [id number] DOB: 11/03/1941 Today's Date: 11/08/2016    History of Present Illness 75 y.o.malewith medical history significant of atrial fibrillation, on Coumadin for anticoagulation, chronic CHF with LVEF of 30-35%. Patient has history of CVA with resultant mild right-sided hemiparesis. Patient brought to the hospital by his wife and his granddaughter as he is been very weak for the past several days--to the point he couldn't get out of the bed. His INR also was found to be >10. +hypotension    PT Comments    Pt performed increased mobility with decreased assistance.  Pt reports he is going to die today.  Pallative care consult was mentioned by MD.    Follow Up Recommendations  SNF;Supervision/Assistance - 24 hour     Equipment Recommendations  Other (comment) (TBA at next venue.  )    Recommendations for Other Services       Precautions / Restrictions Precautions Precautions: Fall Restrictions Weight Bearing Restrictions: No    Mobility  Bed Mobility Overal bed mobility: Needs Assistance Bed Mobility: Rolling;Supine to Sit;Sit to Supine Rolling: Min assist   Supine to sit: Min assist Sit to supine: Supervision   General bed mobility comments: Pt sat edge of bed and PTA observed BM.  Pt then required back to supine for positioning to perform perianal care.  Pt able to roll and sit with decreased assistance.    Transfers Overall transfer level: Needs assistance Equipment used: 2 person hand held assist Transfers: Sit to/from Stand Sit to Stand: Min assist;+2 physical assistance         General transfer comment: Cues for pushing through B LEs, increased weight bearing and muscle activation observed.  Pt with slight posterior lean.    Ambulation/Gait Ambulation/Gait assistance: Min assist;Mod assist;+2 physical assistance Ambulation Distance (Feet):  (steps to chair from bed.  Increased time.   ) Assistive device: 2 person hand held assist Gait Pattern/deviations: Shuffle;Narrow base of support     General Gait Details: Pt performed steps to chair.     Stairs            Wheelchair Mobility    Modified Rankin (Stroke Patients Only)       Balance Overall balance assessment: Needs assistance   Sitting balance-Leahy Scale: Fair       Standing balance-Leahy Scale: Poor                      Cognition Arousal/Alertness: Awake/alert Behavior During Therapy: WFL for tasks assessed/performed Overall Cognitive Status: Impaired/Different from baseline                 General Comments: Reports he is going to die today.      Exercises Total Joint Exercises Ankle Circles/Pumps: AROM;10 reps;Supine Quad Sets: AROM;Both;10 reps;Supine Heel Slides: AROM;Both;10 reps;Supine    General Comments        Pertinent Vitals/Pain Pain Assessment: No/denies pain    Home Living                      Prior Function            PT Goals (current goals can now be found in the care plan section) Acute Rehab PT Goals Patient Stated Goal: wants to go home Potential to Achieve Goals: Poor (Pt is pallative care.  ) Progress towards PT goals: Progressing toward goals    Frequency    Min 2X/week  PT Plan Current plan remains appropriate    Co-evaluation PT/OT/SLP Co-Evaluation/Treatment: Yes Reason for Co-Treatment: For patient/therapist safety PT goals addressed during session: Mobility/safety with mobility;Balance       End of Session Equipment Utilized During Treatment: Oxygen   Patient left: in bed;with call bell/phone within reach;with bed alarm set;with family/visitor present     Time: [id number] PT Time Calculation (min) (ACUTE ONLY): 37 min  Charges:  $Therapeutic Activity: 8-22 mins                    G Codes:      Florestine Aversimee J Carmelita Amparo 11/08/2016, 1:07 PM  Joycelyn RuaAimee Adrinne Sze, PTA pager [phone]

## 2016-11-08 NOTE — Progress Notes (Signed)
Palliative Medicine consult noted. Due to high referral volume, there may be a delay seeing this patient. Please call the Palliative Medicine Team office at [phone] if recommendations are needed in the interim.  Thank you for inviting [place] to see this patient.  Margret Chance Jacari Iannello, RN, BSN, [facility] 11/08/2016 3:01 PM Cell [phone] 8:00-4:00 Monday-Friday Office [phone]

## 2016-11-08 NOTE — Telephone Encounter (Signed)
New message   Patient being discharged tomorrow 11/09/16 to hospice asking if Dr.Jordan will be attending.

## 2016-11-08 NOTE — Progress Notes (Signed)
Patient Name: [name] [name] Date of Encounter: 11/08/2016  Primary Cardiologist: Colyn Miron [place] MD  Hospital Problem List     Principal Problem:   Failure to thrive in adult Active Problems:   CHRONIC SYSTOLIC HEART FAILURE   Atrial fibrillation (HCC)   Status post CVA   Hypothyroidism (acquired)   Chronic kidney disease, stage 3   Weakness   AKI (acute kidney injury) (HCC)     Subjective   Patient is thirsty- wants to drink. No pain. Weak.  Inpatient Medications    Scheduled Meds: . calcitRIOL  0.25 mcg Oral Daily  . docusate sodium  100 mg Oral BID  . folic acid  1 mg Oral Daily  . levothyroxine  50 mcg Oral QAC breakfast  . multivitamin  1 tablet Oral Daily  . polyethylene glycol  17 g Oral Daily  . warfarin  0.5 mg Oral ONCE-1800  . Warfarin - Pharmacist Dosing Inpatient   Does not apply q1800   Continuous Infusions: . sodium chloride 75 mL/hr at 11/08/16 0400   PRN Meds: acetaminophen **OR** acetaminophen, ondansetron **OR** ondansetron (ZOFRAN) IV   Vital Signs    Vitals:   11/07/16 0730 11/07/16 1417 11/07/16 2103 11/08/16 0500  BP: (!) 95/40 (!) 93/46 (!) 114/91 (!) 94/59  Pulse: (!) 59 (!) 58 (!) 58 60  Resp: 16 18 20 20   Temp: 98.4 F (36.9 C)  97.8 F (36.6 C) 98.6 F (37 C)  TempSrc: Oral  Oral Oral  SpO2: 91% 94% 91% 96%  Weight:      Height:        Intake/Output Summary (Last 24 hours) at 11/08/16 0913 Last data filed at 11/08/16 0400  Gross per 24 hour  Intake          3984.42 ml  Output              750 ml  Net          3234.42 ml   Filed Weights   11/05/16 1531 11/05/16 1945  Weight: 130 lb (59 kg) 132 lb 14.4 oz (60.3 kg)    Physical Exam    General: Pleasant, elderly male, very frail.  Psych: Normal affect. Neuro: Alert and oriented X 3. Moves all extremities spontaneously. HEENT: Normal           Neck: Supple without bruits, + JVD. Lungs:  Resp regular and unlabored, diminished in bases Heart: RRR no s3, s4, or  murmurs. Abdomen: Soft, non-tender, non-distended, BS + x 4.  Extremities: No clubbing, cyanosis or edema. DP/PT/Radials 2+ and equal bilaterally.   Labs    CBC  Recent Labs  11/07/16 0542 11/08/16 0349  WBC 14.8* 13.0*  HGB 9.7* 9.4*  HCT 29.8* 28.8*  MCV 92.8 92.9  PLT 120* 126*   Basic Metabolic Panel  Recent Labs  11/07/16 0542 11/08/16 0349  NA 138 141  K 3.9 4.1  CL 110 113*  CO2 20* 20*  GLUCOSE 101* 83  BUN 49* 55*  CREATININE 2.69* 2.85*  CALCIUM 8.6* 8.5*   Liver Function Tests  Recent Labs  11/07/16 1556 11/08/16 0349  AST 73* 61*  ALT 39 34  ALKPHOS 117 105  BILITOT 3.8* 3.4*  PROT 6.1* 5.9*  ALBUMIN 2.2* 2.0*   No results for input(s): LIPASE, AMYLASE in the last 72 hours. Cardiac Enzymes  Recent Labs  11/07/16 1556 11/08/16 0349  CKTOTAL 148 223   BNP Invalid input(s): POCBNP D-Dimer No results for input(s): DDIMER  in the last 72 hours. Hemoglobin A1C  Recent Labs  11/06/16 0514  HGBA1C 4.8   Fasting Lipid Panel No results for input(s): CHOL, HDL, LDLCALC, TRIG, CHOLHDL, LDLDIRECT in the last 72 hours. Thyroid Function Tests  Recent Labs  11/06/16 0514  TSH 5.557*    Telemetry    N/A - Personally Reviewed  ECG    V paced- Personally Reviewed  Radiology    Dg Chest 1 View  Result Date: 11/07/2016 CLINICAL DATA:  Cough. EXAM: CHEST 1 VIEW COMPARISON:  11/05/2016 FINDINGS: Stable normal heart size. Status post CABG and biventricular ICD/ pacer implant. Chronic interstitial coarsening, likely interstitial lung disease. Lung volumes are normal and no honeycombing is noted. No evidence of superimposed pneumonia or edema. No effusion or pneumothorax. IMPRESSION: Chronic lung disease without acute superimposed finding. Electronically Signed   By: Marnee Spring M.D.   On: 11/07/2016 15:53   US Renal  Result Date: 11/06/2016 CLINICAL DATA:  Chronic kidney disease stage 3 EXAM: RENAL / URINARY TRACT ULTRASOUND  COMPLETE COMPARISON:  None. FINDINGS: Right Kidney: Length: 8.6 cm. Echogenicity within normal limits. No mass or hydronephrosis visualized. Severe cortical atrophy is noted. Left Kidney: Length: 8.6 cm. Echogenicity within normal limits. No mass or hydronephrosis visualized. Severe cortical atrophy is noted. Bladder: Appears normal for degree of bladder distention. IMPRESSION: There is severe cortical atrophy bilaterally. No hydronephrosis or mass. Electronically Signed   By: Jolaine Click M.D.   On: 11/06/2016 19:46   Dg Swallowing Func-speech Pathology  Result Date: 11/07/2016 Objective Swallowing Evaluation: Type of Study: MBS-Modified Barium Swallow Study Patient Details Name: [name] [name] MRN: [id number] Date of Birth: [birth year] Today's Date: 11/07/2016 Time: SLP Start Time (ACUTE ONLY): 1520-SLP Stop Time (ACUTE ONLY): 1550 SLP Time Calculation (min) (ACUTE ONLY): 30 min Past Medical History: Past Medical History: Diagnosis Date . Anxiety  . Atrial fibrillation (HCC)   chronic . Cardiomyopathy, ischemic   EF of 30-35% . CHF (congestive heart failure) (HCC)  . CKD (chronic kidney disease) stage 3, GFR 30-59 ml/min  . Coronary artery disease  . Dyslipidemia  . Hypertension  . Myocardial infarction January 2001   History of anteroseptal myocardial infarction presenting with left bundle-branch block . Pacemaker  . Peripheral vascular disease (HCC)  . Status post CVA  . Thyroid disease   hypo,due to amiodarone therapy,resolved . Tobacco abuse   History of 2 pack per day tobacco habituation . VT (ventricular tachycardia) (HCC) aug of 2010  removed old ICD and replaced with biventricular ICD Past Surgical History: Past Surgical History: Procedure Laterality Date . CARDIAC CATHETERIZATION  2001  PTCA . CARDIAC CATHETERIZATION  2005  patent grafts . cataracts  2006  right eye . CHOLECYSTECTOMY   . CORONARY ARTERY BYPASS GRAFT  2001  x 4 . FEMORAL BYPASS    fem-fem bpg, right fem pop bpg . icd  07/15/2009  implant  Dr. Ladona Ridgel . IMPLANTABLE CARDIOVERTER DEFIBRILLATOR (ICD) GENERATOR CHANGE N/A 11/28/2012  Procedure: ICD GENERATOR CHANGE;  Surgeon: Marinus Maw, MD;  Location: [facility] CATH LAB;  Service: Cardiovascular;  Laterality: N/A; HPI: [name] J Friddleis a 75 y.o.malewith medical history significant of atrial fibrillation, on Coumadin for anticoagulation, chronic CHF with LVEF of 30-35%. Patient has history of CVA with resultant mild right-sided hemiparesis. Patient brought to the hospital by his wife and his granddaughter as he is been very weak for the past several days. He is eating less, getting up less, and he reports that he is weak to  the point he couldn't get out of the bed today so they brought him for further evaluation. Denies any fever or chills. His INR also was found to be >10. He was given vitamin K as well as FFP. OT notes indicate pt provided with small sip of water via straw in upright position at bed level; pt noted to have wet cough following sip of water. Pt and wife report this is new since hospitalization. Renal imaging revealed severe cortical atrophy bilaterally. No hydronephrosis or mass. MRI didn't reveal any acute abnormality. Remote infarcts within the basal ganglia and left insular cortex and atrophy/small vessel disease. 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 Pt hospitalized on 05/09/2016 acute on chronic hypoxic respiratory failure secondary to community-acquired pneumonia and decompensated CHF. Bedside Swallow Evaluation provided on 05/09/2016 and regular with thin liquid diet recommended.  Subjective: alert, pleasant Assessment / Plan / Recommendation CHL IP CLINICAL IMPRESSIONS 11/07/2016 Therapy Diagnosis Severe pharyngeal phase dysphagia Clinical Impression Pt has severe sensorimotor dysphagia c/b delayed swallow initiation across all consistencies and pharyngeal weakness. This  results in aspiration of thin liquids as well as penetration of significant pharyngeal residue from nectar thick liquids and puree consistencies, none of which were spontaneously cleared. Pt with difficulty consistently implementing compensatory strategies. At times, pt was able to increase airway protection with cough and double swallow.  Given significant dysphagia and decreased ability to reliably use compensatory swallow strategies, pt is at high risk for aspiration with all PO intake. Education provided to MD on results of study and aspiration risk. Would recommend to remain NPO pending further GOC discussion. Should pt/family choose to accept the risks of aspiration, a full nectar thick liquid diet with use of aforementioned strategies may be considered. Impact on safety and function Severe aspiration risk;Risk for inadequate nutrition/hydration   CHL IP TREATMENT RECOMMENDATION 11/07/2016 Treatment Recommendations Therapy as outlined in treatment plan below   Prognosis 11/07/2016 Prognosis for Safe Diet Advancement Guarded Barriers to Reach Goals Cognitive deficits;Severity of deficits Barriers/Prognosis Comment -- CHL IP DIET RECOMMENDATION 11/07/2016 SLP Diet Recommendations NPO Liquid Administration via -- Medication Administration Via alternative means Compensations -- Postural Changes --   CHL IP OTHER RECOMMENDATIONS 11/07/2016 Recommended Consults -- Oral Care Recommendations Oral care QID Other Recommendations Remove water pitcher   CHL IP FOLLOW UP RECOMMENDATIONS 11/07/2016 Follow up Recommendations Skilled Nursing facility   [facility] IP FREQUENCY AND DURATION 11/07/2016 Speech Therapy Frequency (ACUTE ONLY) min 2x/week Treatment Duration 2 weeks      CHL IP ORAL PHASE 11/07/2016 Oral Phase WFL Oral - Pudding Teaspoon -- Oral - Pudding Cup -- Oral - Honey Teaspoon -- Oral - Honey Cup -- Oral - Nectar Teaspoon -- Oral - Nectar Cup -- Oral - Nectar Straw -- Oral - Thin Teaspoon -- Oral - Thin Cup -- Oral -  Thin Straw -- Oral - Puree -- Oral - Mech Soft -- Oral - Regular -- Oral - Multi-Consistency -- Oral - Pill -- Oral Phase - Comment --  CHL IP PHARYNGEAL PHASE 11/07/2016 Pharyngeal Phase Impaired Pharyngeal- Pudding Teaspoon -- Pharyngeal -- Pharyngeal- Pudding Cup -- Pharyngeal -- Pharyngeal- Honey Teaspoon -- Pharyngeal -- Pharyngeal- Honey Cup -- Pharyngeal -- Pharyngeal- Nectar Teaspoon Delayed swallow initiation-vallecula;Penetration/Apiration after swallow;Pharyngeal residue - valleculae;Pharyngeal residue - posterior pharnyx;Pharyngeal residue - pyriform;Pharyngeal residue - cp segment;Trace aspiration;Reduced pharyngeal peristalsis;Reduced tongue base retraction;Compensatory strategies attempted (with notebox) Pharyngeal Material enters airway, remains  ABOVE vocal cords and not ejected out Pharyngeal- Nectar Cup Delayed swallow initiation-vallecula;Delayed swallow initiation-pyriform sinuses;Penetration/Apiration after swallow;Trace aspiration;Pharyngeal residue - valleculae;Pharyngeal residue - pyriform;Pharyngeal residue - posterior pharnyx;Pharyngeal residue - cp segment;Reduced pharyngeal peristalsis;Reduced tongue base retraction;Compensatory strategies attempted (with notebox) Pharyngeal Material enters airway, remains ABOVE vocal cords and not ejected out Pharyngeal- Nectar Straw -- Pharyngeal -- Pharyngeal- Thin Teaspoon Delayed swallow initiation-pyriform sinuses;Reduced airway/laryngeal closure;Significant aspiration (Amount);Penetration/Aspiration before swallow Pharyngeal Material enters airway, passes BELOW cords without attempt by patient to eject out (silent aspiration) Pharyngeal- Thin Cup -- Pharyngeal -- Pharyngeal- Thin Straw -- Pharyngeal -- Pharyngeal- Puree Delayed swallow initiation-vallecula;Pharyngeal residue - valleculae;Pharyngeal residue - pyriform;Pharyngeal residue - posterior pharnyx;Pharyngeal residue - cp segment;Reduced tongue base retraction;Reduced pharyngeal peristalsis  Pharyngeal -- Pharyngeal- Mechanical Soft -- Pharyngeal -- Pharyngeal- Regular -- Pharyngeal -- Pharyngeal- Multi-consistency -- Pharyngeal -- Pharyngeal- Pill -- Pharyngeal -- Pharyngeal Comment --  CHL IP CERVICAL ESOPHAGEAL PHASE 11/07/2016 Cervical Esophageal Phase WFL Pudding Teaspoon -- Pudding Cup -- Honey Teaspoon -- Honey Cup -- Nectar Teaspoon -- Nectar Cup -- Nectar Straw -- Thin Teaspoon -- Thin Cup -- Thin Straw -- Puree -- Mechanical Soft -- Regular -- Multi-consistency -- Pill -- Cervical Esophageal Comment -- CHL IP GO 11/07/2016 Functional Assessment Tool Used Skilled Clinical observation Functional Limitations Swallowing Swallow Current Status (M0102) CM Swallow Goal Status (V2536) CK Swallow Discharge Status (U4403) CI Motor Speech Current Status (K7425) (None) Motor Speech Goal Status (Z5638) (None) Motor Speech Goal Status (V5643) (None) Spoken Language Comprehension Current Status (P2951) (None) Spoken Language Comprehension Goal Status (O8416) (None) Spoken Language Comprehension Discharge Status (S0630) (None) Spoken Language Expression Current Status (Z6010) (None) Spoken Language Expression Goal Status (X3235) (None) Spoken Language Expression Discharge Status [phone]) (None) Attention Current Status (G2542) (None) Attention Goal Status (H0623) (None) Attention Discharge Status (J6283) (None) Memory Current Status (T5176) (None) Memory Goal Status (H6073) (None) Memory Discharge Status (X1062) (None) Voice Current Status (I9485) (None) Voice Goal Status (I6270) (None) Voice Discharge Status (J5009) (None) Other Speech-Language Pathology Functional Limitation (F8182) (None) Other Speech-Language Pathology Functional Limitation Goal Status (X9371) (None) Other Speech-Language Pathology Functional Limitation Discharge Status [phone]) (None) Happi Overton 11/07/2016, 4:37 PM               Cardiac Studies   none  Patient Profile     [name] [name] is a 75 year old male with a past medical  history of CAD s/p CABG in 2001 (LIMA-LAD, SVG - first OM, and SVG - PDA), chronic atrial fibrillation on Coumadin, ischemic cardiomyopathy (EF 40-45%) s/p BiV ICD revised in 2013, and CKD. Admitted on 11/05/16 with weakness, INR was >10.   Assessment & Plan    1. Chronic atrial fibrillation: On Coumadin, INR has normalized. Now 1.84. Now NPO due to aspiration risk. I would not start IV heparin now. Need to determine goals of care.   This patients CHA2DS2-VASc Score and unadjusted Ischemic Stroke Rate (% per year) is equal to 9.7 % stroke rate/year from a score of 6 Above score calculated as 1 point each if present [CHF, HTN, DM, Vascular=MI/PAD/Aortic Plaque, Age if 65-74, or Male], 2 points each if present [Age > 75, or Stroke/TIA/TE]  2. History of VT: s/p BiV ICD, patient on Amiodarone which is on hold.  3. Failure to thrive: Unclear etiology. No clear reason. No reversible cause seen . Is suspect this is involutional. Prognosis is very poor.   4. Chronic systolic CHF: Appears compensated. All meds on hold due to hypotension.  5. Acute on chronic kidney disease: No ACE-I or ARB. Renal function is worse despite holding meds and hydration  6. Severe swallowing dysfunction with high aspiration risk. Now NPO.  I discussed situation with family and patient. I see no correctable cause for his decline. He has progressive renal dysfunction. I do not think he is a candidate for dialysis. Prognosis is poor. Patien t would benefit from goals of care discussion with palliative care. Will consult today. With feeding tube we could help nutrition but I think it is unlikely that this would prolong life significantly. I would favor a comfort care approach.  Signed, Delmont Prosch [name], MD  11/08/2016, 9:13 AM

## 2016-11-09 LAB — PROTIME-INR
INR: 1.92
PROTHROMBIN TIME: 22.2 s — AB (ref 11.4–15.2)

## 2016-11-09 MED ORDER — MORPHINE SULFATE (CONCENTRATE) 10 MG/0.5ML PO SOLN: 10.0000 mg | Freq: Four times a day (QID) | ORAL | 0 refills | Status: AC | PRN

## 2016-11-09 MED ORDER — ALPRAZOLAM 0.5 MG PO TABS: 0.5000 mg | ORAL_TABLET | Freq: Three times a day (TID) | ORAL | 0 refills | Status: AC | PRN

## 2016-11-09 MED ORDER — FUROSEMIDE 40 MG PO TABS: 40.0000 mg | ORAL_TABLET | Freq: Every day | ORAL | 3 refills | Status: AC

## 2016-11-09 NOTE — Discharge Summary (Signed)
Physician Discharge Summary  [name] [name] MRN: [id number] DOB/AGE: 07/18/1941 75 y.o.  PCP: Woody Seller, MD   Admit date: 11/05/2016 Discharge date: 11/09/2016  Discharge Diagnoses:    Principal Problem:   Failure to thrive in adult Active Problems:   CHRONIC SYSTOLIC HEART FAILURE   Atrial fibrillation ([place])   Status post CVA   Hypothyroidism (acquired)   Chronic kidney disease, stage 3   Weakness   AKI (acute kidney injury) ([place])    Follow-up recommendations Patient is being discharged home with hospice DME orders in place Patient to continue with comfort feeding       Current Discharge Medication List    START taking these medications   Details  Morphine Sulfate (MORPHINE CONCENTRATE) 10 MG/0.5ML SOLN concentrated solution Take 0.5 mLs (10 mg total) by mouth every 6 (six) hours as needed for severe pain. Qty: 30 mL, Refills: 0      CONTINUE these medications which have CHANGED   Details  ALPRAZolam (XANAX) 0.5 MG tablet Take 1 tablet (0.5 mg total) by mouth 3 (three) times daily as needed for anxiety. Take 1 tablet (0.5 mg) by mouth twice daily, may also take 1 tablet in the afternoon as needed for anxiety Qty: 90 tablet, Refills: 0    furosemide (LASIX) 40 MG tablet Take 1 tablet (40 mg total) by mouth daily. Qty: 180 tablet, Refills: 3      CONTINUE these medications which have NOT CHANGED   Details  acetaminophen (TYLENOL) 500 MG tablet Take 1,000 mg by mouth every 6 (six) hours as needed (pain).    carvedilol (COREG) 3.125 MG tablet Take 1 tablet (3.125 mg total) by mouth 2 (two) times daily. Qty: 180 tablet, Refills: 10    levothyroxine (SYNTHROID, LEVOTHROID) 50 MCG tablet Take 1 tablet (50 mcg total) by mouth daily before breakfast. Qty: 90 tablet, Refills: 3    Multiple Vitamins-Minerals (PRESERVISION AREDS 2) CAPS Take 1 capsule by mouth daily.     OXYGEN Inhale 4 L into the lungs continuous.     potassium chloride (K-DUR) 10 MEQ  tablet Take 1 tablet (10 mEq total) by mouth daily. Qty: 90 tablet, Refills: 3      STOP taking these medications     allopurinol (ZYLOPRIM) 100 MG tablet      amiodarone (PACERONE) 200 MG tablet      calcitRIOL (ROCALTROL) 0.25 MCG capsule      cyanocobalamin (,VITAMIN B-12,) 1000 MCG/ML injection      digoxin (LANOXIN) 0.125 MG tablet      folic acid (FOLVITE) 1 MG tablet      HYDROcodone-acetaminophen (NORCO/VICODIN) 5-325 MG tablet      loperamide (IMODIUM A-D) 2 MG tablet      lovastatin (MEVACOR) 40 MG tablet      OVER THE COUNTER MEDICATION      warfarin (COUMADIN) 1 MG tablet      Cyanocobalamin (VITAMIN B-12 PO)      warfarin (COUMADIN) 1 MG tablet          Discharge Condition: Overall prognosis for   Discharge Instructions Get Medicines reviewed and adjusted: Please take all your medications with you for your next visit with your Primary MD  Please request your Primary MD to go over all hospital tests and procedure/radiological results at the follow up, please ask your Primary MD to get all Hospital records sent to his/her office.  If you experience worsening of your admission symptoms, develop shortness of breath, life threatening emergency, suicidal  or homicidal thoughts you must seek medical attention immediately by calling 911 or calling your MD immediately if symptoms less severe.  You must read complete instructions/literature along with all the possible adverse reactions/side effects for all the Medicines you take and that have been prescribed to you. Take any new Medicines after you have completely understood and accpet all the possible adverse reactions/side effects.   Do not drive when taking Pain medications.   Do not take more than prescribed Pain, Sleep and Anxiety Medications  Special Instructions: If you have smoked or chewed Tobacco in the last 2 yrs please stop smoking, stop any regular Alcohol and or any Recreational drug  use.  Wear Seat belts while driving.       Allergies  Allergen Reactions  . Nitroglycerin Other (See Comments)    Became severely hypotensive  . Ace Inhibitors Other (See Comments)    Listed as intolerance - Unknown Reaction per previous records.      Disposition: Home with hospice   Consults:  cardiology    Significant Diagnostic Studies:  Dg Chest 1 View  Result Date: 11/07/2016 CLINICAL DATA:  Cough. EXAM: CHEST 1 VIEW COMPARISON:  11/05/2016 FINDINGS: Stable normal heart size. Status post CABG and biventricular ICD/ pacer implant. Chronic interstitial coarsening, likely interstitial lung disease. Lung volumes are normal and no honeycombing is noted. No evidence of superimposed pneumonia or edema. No effusion or pneumothorax. IMPRESSION: Chronic lung disease without acute superimposed finding. Electronically Signed   By: Monte Fantasia M.D.   On: 11/07/2016 15:53   Dg Chest 2 View  Result Date: 11/05/2016 CLINICAL DATA:  Fatigue.  Prior pneumonia. EXAM: CHEST  2 VIEW COMPARISON:  May 12, 2016 FINDINGS: Mild opacity in the left lung base is stable and may represent scarring or atelectasis. No focal infiltrate. Stable AICD. Stable cardiomegaly. The hila and mediastinum are unremarkable. No pneumothorax. IMPRESSION: Mild persistent opacity in the left lung base is unchanged. No acute focal infiltrate. Electronically Signed   By: Dorise Bullion III M.D   On: 11/05/2016 17:40   Ct Head Wo Contrast  Result Date: 11/05/2016 CLINICAL DATA:  Pt here with [name] who reports pt has had increased fatigue and weakness X2 weeks. [name] states over the last two days pt has stopped walking and stopped feeding himself. Pt is alert and oriented but appears weak. EXAM: CT HEAD WITHOUT CONTRAST TECHNIQUE: Contiguous axial images were obtained from the base of the skull through the vertex without intravenous contrast. COMPARISON:  10/18/2004 FINDINGS: Brain: Remote infarcts are identified within  the left and right basal ganglia and left insular cortex. There is encephalomalacia of the left temporal lobe. There is no intra or extra-axial fluid collection or mass lesion. The basilar cisterns and ventricles have a normal appearance. There is no CT evidence for acute infarction or hemorrhage. There is central and cortical atrophy. Periventricular white matter changes are consistent with small vessel disease. Vascular: There is atherosclerotic calcification of the carotid siphons. Skull: Normal. Negative for fracture or focal lesion. Sinuses/Orbits: Opacity of the left maxillary sinus is new since the prior study. There is inward bowing of the lateral wall of the left maxillary sinus and the size of the sinus is smaller. The floor of the left orbit appears inferiorly displaced and findings raise the question of silent sinus syndrome. Other: None IMPRESSION: 1.  No evidence for acute  abnormality. 2. Remote infarcts within the basal ganglia and left insular cortex. 3. Atrophy and small vessel disease. 4.  Question of silent sinus syndrome involving the left maxillary sinus. Question of facial asymmetry and left enophthalmos. Electronically Signed   By: Norva Pavlov M.D.   On: 11/05/2016 16:32   US Renal  Result Date: 11/06/2016 CLINICAL DATA:  Chronic kidney disease stage 3 EXAM: RENAL / URINARY TRACT ULTRASOUND COMPLETE COMPARISON:  None. FINDINGS: Right Kidney: Length: 8.6 cm. Echogenicity within normal limits. No mass or hydronephrosis visualized. Severe cortical atrophy is noted. Left Kidney: Length: 8.6 cm. Echogenicity within normal limits. No mass or hydronephrosis visualized. Severe cortical atrophy is noted. Bladder: Appears normal for degree of bladder distention. IMPRESSION: There is severe cortical atrophy bilaterally. No hydronephrosis or mass. Electronically Signed   By: Jolaine Click M.D.   On: 11/06/2016 19:46   Dg Swallowing Func-speech Pathology  Result Date: 11/07/2016 Objective  Swallowing Evaluation: Type of Study: MBS-Modified Barium Swallow Study Patient Details Name: JASHUA KNAAK MRN: [id number] Date of Birth: Oct 25, 1941 Today's Date: 11/07/2016 Time: SLP Start Time (ACUTE ONLY): 1520-SLP Stop Time (ACUTE ONLY): 1550 SLP Time Calculation (min) (ACUTE ONLY): 30 min Past Medical History: Past Medical History: Diagnosis Date . Anxiety  . Atrial fibrillation (HCC)   chronic . Cardiomyopathy, ischemic   EF of 30-35% . CHF (congestive heart failure) (HCC)  . CKD (chronic kidney disease) stage 3, GFR 30-59 ml/min  . Coronary artery disease  . Dyslipidemia  . Hypertension  . Myocardial infarction January 2001   History of anteroseptal myocardial infarction presenting with left bundle-branch block . Pacemaker  . Peripheral vascular disease (HCC)  . Status post CVA  . Thyroid disease   hypo,due to amiodarone therapy,resolved . Tobacco abuse   History of 2 pack per day tobacco habituation . VT (ventricular tachycardia) (HCC) aug of 2010  removed old ICD and replaced with biventricular ICD Past Surgical History: Past Surgical History: Procedure Laterality Date . CARDIAC CATHETERIZATION  2001  PTCA . CARDIAC CATHETERIZATION  2005  patent grafts . cataracts  2006  right eye . CHOLECYSTECTOMY   . CORONARY ARTERY BYPASS GRAFT  2001  x 4 . FEMORAL BYPASS    fem-fem bpg, right fem pop bpg . icd  07/15/2009  implant Dr. Ladona Ridgel . IMPLANTABLE CARDIOVERTER DEFIBRILLATOR (ICD) GENERATOR CHANGE N/A 11/28/2012  Procedure: ICD GENERATOR CHANGE;  Surgeon: Marinus Maw, MD;  Location: [facility] CATH LAB;  Service: Cardiovascular;  Laterality: N/A; HPI: [name] J Friddleis a 75 y.o.malewith medical history significant of atrial fibrillation, on Coumadin for anticoagulation, chronic CHF with LVEF of 30-35%. Patient has history of CVA with resultant mild right-sided hemiparesis. Patient brought to the hospital by his [name] and his granddaughter as he is been very weak for the past several days. He is eating less, getting  up less, and he reports that he is weak to the point he couldn't get out of the bed today so they brought him for further evaluation. Denies any fever or chills. His INR also was found to be >10. He was given vitamin K as well as FFP. OT notes indicate pt provided with small sip of water via straw in upright position at bed level; pt noted to have wet cough following sip of water. Pt and [name] report this is new since hospitalization. Renal imaging revealed severe cortical atrophy bilaterally. No hydronephrosis or mass. MRI didn't reveal any acute abnormality. Remote infarcts within the basal ganglia and left insular cortex and atrophy/small vessel disease. Of note, pt with question of facial asymmetry and left enophthalmos as well as question of  silent sinus syndrome involving the left maxillary sinus. Pt hospitalized on 05/09/2016 acute on chronic hypoxic respiratory failure secondary to community-acquired pneumonia and decompensated CHF. Bedside Swallow Evaluation provided on 05/09/2016 and regular with thin liquid diet recommended.  Subjective: alert, pleasant Assessment / Plan / Recommendation CHL IP CLINICAL IMPRESSIONS 11/07/2016 Therapy Diagnosis Severe pharyngeal phase dysphagia Clinical Impression Pt has severe sensorimotor dysphagia c/b delayed swallow initiation across all consistencies and pharyngeal weakness. This results in aspiration of thin liquids as well as penetration of significant pharyngeal residue from nectar thick liquids and puree consistencies, none of which were spontaneously cleared. Pt with difficulty consistently implementing compensatory strategies. At times, pt was able to increase airway protection with cough and double swallow.  Given significant dysphagia and decreased ability to reliably use compensatory swallow strategies, pt is at high risk for aspiration with all PO intake. Education provided to MD on results of study and aspiration risk. Would recommend to remain NPO pending  further GOC discussion. Should pt/family choose to accept the risks of aspiration, a full nectar thick liquid diet with use of aforementioned strategies may be considered. Impact on safety and function Severe aspiration risk;Risk for inadequate nutrition/hydration   CHL IP TREATMENT RECOMMENDATION 11/07/2016 Treatment Recommendations Therapy as outlined in treatment plan below   Prognosis 11/07/2016 Prognosis for Safe Diet Advancement Guarded Barriers to Reach Goals Cognitive deficits;Severity of deficits Barriers/Prognosis Comment -- CHL IP DIET RECOMMENDATION 11/07/2016 SLP Diet Recommendations NPO Liquid Administration via -- Medication Administration Via alternative means Compensations -- Postural Changes --   CHL IP OTHER RECOMMENDATIONS 11/07/2016 Recommended Consults -- Oral Care Recommendations Oral care QID Other Recommendations Remove water pitcher   CHL IP FOLLOW UP RECOMMENDATIONS 11/07/2016 Follow up Recommendations Skilled Nursing facility   [facility] IP FREQUENCY AND DURATION 11/07/2016 Speech Therapy Frequency (ACUTE ONLY) min 2x/week Treatment Duration 2 weeks      CHL IP ORAL PHASE 11/07/2016 Oral Phase WFL Oral - Pudding Teaspoon -- Oral - Pudding Cup -- Oral - Honey Teaspoon -- Oral - Honey Cup -- Oral - Nectar Teaspoon -- Oral - Nectar Cup -- Oral - Nectar Straw -- Oral - Thin Teaspoon -- Oral - Thin Cup -- Oral - Thin Straw -- Oral - Puree -- Oral - Mech Soft -- Oral - Regular -- Oral - Multi-Consistency -- Oral - Pill -- Oral Phase - Comment --  CHL IP PHARYNGEAL PHASE 11/07/2016 Pharyngeal Phase Impaired Pharyngeal- Pudding Teaspoon -- Pharyngeal -- Pharyngeal- Pudding Cup -- Pharyngeal -- Pharyngeal- Honey Teaspoon -- Pharyngeal -- Pharyngeal- Honey Cup -- Pharyngeal -- Pharyngeal- Nectar Teaspoon Delayed swallow initiation-vallecula;Penetration/Apiration after swallow;Pharyngeal residue - valleculae;Pharyngeal residue - posterior pharnyx;Pharyngeal residue - pyriform;Pharyngeal residue - cp  segment;Trace aspiration;Reduced pharyngeal peristalsis;Reduced tongue base retraction;Compensatory strategies attempted (with notebox) Pharyngeal Material enters airway, remains ABOVE vocal cords and not ejected out Pharyngeal- Nectar Cup Delayed swallow initiation-vallecula;Delayed swallow initiation-pyriform sinuses;Penetration/Apiration after swallow;Trace aspiration;Pharyngeal residue - valleculae;Pharyngeal residue - pyriform;Pharyngeal residue - posterior pharnyx;Pharyngeal residue - cp segment;Reduced pharyngeal peristalsis;Reduced tongue base retraction;Compensatory strategies attempted (with notebox) Pharyngeal Material enters airway, remains ABOVE vocal cords and not ejected out Pharyngeal- Nectar Straw -- Pharyngeal -- Pharyngeal- Thin Teaspoon Delayed swallow initiation-pyriform sinuses;Reduced airway/laryngeal closure;Significant aspiration (Amount);Penetration/Aspiration before swallow Pharyngeal Material enters airway, passes BELOW cords without attempt by patient to eject out (silent aspiration) Pharyngeal- Thin Cup -- Pharyngeal -- Pharyngeal- Thin Straw -- Pharyngeal -- Pharyngeal- Puree Delayed swallow initiation-vallecula;Pharyngeal residue - valleculae;Pharyngeal residue - pyriform;Pharyngeal residue - posterior pharnyx;Pharyngeal residue - cp segment;Reduced tongue base retraction;Reduced  pharyngeal peristalsis Pharyngeal -- Pharyngeal- Mechanical Soft -- Pharyngeal -- Pharyngeal- Regular -- Pharyngeal -- Pharyngeal- Multi-consistency -- Pharyngeal -- Pharyngeal- Pill -- Pharyngeal -- Pharyngeal Comment --  CHL IP CERVICAL ESOPHAGEAL PHASE 11/07/2016 Cervical Esophageal Phase WFL Pudding Teaspoon -- Pudding Cup -- Honey Teaspoon -- Honey Cup -- Nectar Teaspoon -- Nectar Cup -- Nectar Straw -- Thin Teaspoon -- Thin Cup -- Thin Straw -- Puree -- Mechanical Soft -- Regular -- Multi-consistency -- Pill -- Cervical Esophageal Comment -- CHL IP GO 11/07/2016 Functional Assessment Tool Used Skilled  Clinical observation Functional Limitations Swallowing Swallow Current Status (H6314) CM Swallow Goal Status (H7026) CK Swallow Discharge Status (V7858) CI Motor Speech Current Status (I5027) (None) Motor Speech Goal Status (X4128) (None) Motor Speech Goal Status (N8676) (None) Spoken Language Comprehension Current Status (H2094) (None) Spoken Language Comprehension Goal Status (B0962) (None) Spoken Language Comprehension Discharge Status [phone]) (None) Spoken Language Expression Current Status (H4765) (None) Spoken Language Expression Goal Status (Y6503) (None) Spoken Language Expression Discharge Status [phone]) (None) Attention Current Status (C1275) (None) Attention Goal Status (T7001) (None) Attention Discharge Status (V4944) (None) Memory Current Status (H6759) (None) Memory Goal Status (F6384) (None) Memory Discharge Status (Y6599) (None) Voice Current Status (J5701) (None) Voice Goal Status (X7939) (None) Voice Discharge Status (Q3009) (None) Other Speech-Language Pathology Functional Limitation [phone]) (None) Other Speech-Language Pathology Functional Limitation Goal Status (T6226) (None) Other Speech-Language Pathology Functional Limitation Discharge Status [phone]) (None) [name] [name] 11/07/2016, 4:37 PM                  Filed Weights   11/05/16 1531 11/05/16 1945  Weight: 59 kg (130 lb) 60.3 kg (132 lb 14.4 oz)     Microbiology: Recent Results (from the past 240 hour(s))  Urine culture     Status: None   Collection Time: 11/05/16  8:11 PM  Result Value Ref Range Status   Specimen Description URINE, RANDOM  Final   Special Requests NONE  Final   Culture NO GROWTH  Final   Report Status 11/07/2016 FINAL  Final       Blood Culture    Component Value Date/Time   SDES URINE, RANDOM 11/05/2016 2011   SPECREQUEST NONE 11/05/2016 2011   CULT NO GROWTH 11/05/2016 2011   REPTSTATUS 11/07/2016 FINAL 11/05/2016 2011      Labs: Results for orders placed or performed during the  hospital encounter of 11/05/16 (from the past 48 hour(s))  Hepatic function panel     Status: Abnormal   Collection Time: 11/07/16  3:56 PM  Result Value Ref Range   Total Protein 6.1 (L) 6.5 - 8.1 g/dL   Albumin 2.2 (L) 3.5 - 5.0 g/dL   AST 73 (H) 15 - 41 U/L   ALT 39 17 - 63 U/L   Alkaline Phosphatase 117 38 - 126 U/L   Total Bilirubin 3.8 (H) 0.3 - 1.2 mg/dL   Bilirubin, Direct 1.2 (H) 0.1 - 0.5 mg/dL   Indirect Bilirubin 2.6 (H) 0.3 - 0.9 mg/dL  Sedimentation rate     Status: Abnormal   Collection Time: 11/07/16  3:56 PM  Result Value Ref Range   Sed Rate 56 (H) 0 - 16 mm/hr  CK     Status: None   Collection Time: 11/07/16  3:56 PM  Result Value Ref Range   Total CK 148 49 - 397 U/L  Protime-INR     Status: Abnormal   Collection Time: 11/08/16  3:49 AM  Result Value Ref Range   Prothrombin  Time 21.5 (H) 11.4 - 15.2 seconds   INR 1.84   CBC     Status: Abnormal   Collection Time: 11/08/16  3:49 AM  Result Value Ref Range   WBC 13.0 (H) 4.0 - 10.5 K/uL   RBC 3.10 (L) 4.22 - 5.81 MIL/uL   Hemoglobin 9.4 (L) 13.0 - 17.0 g/dL   HCT 28.8 (L) 39.0 - 52.0 %   MCV 92.9 78.0 - 100.0 fL   MCH 30.3 26.0 - 34.0 pg   MCHC 32.6 30.0 - 36.0 g/dL   RDW 18.0 (H) 11.5 - 15.5 %   Platelets 126 (L) 150 - 400 K/uL  Comprehensive metabolic panel     Status: Abnormal   Collection Time: 11/08/16  3:49 AM  Result Value Ref Range   Sodium 141 135 - 145 mmol/L   Potassium 4.1 3.5 - 5.1 mmol/L   Chloride 113 (H) 101 - 111 mmol/L   CO2 20 (L) 22 - 32 mmol/L   Glucose, Bld 83 65 - 99 mg/dL   BUN 55 (H) 6 - 20 mg/dL   Creatinine, Ser 2.85 (H) 0.61 - 1.24 mg/dL   Calcium 8.5 (L) 8.9 - 10.3 mg/dL   Total Protein 5.9 (L) 6.5 - 8.1 g/dL   Albumin 2.0 (L) 3.5 - 5.0 g/dL   AST 61 (H) 15 - 41 U/L   ALT 34 17 - 63 U/L   Alkaline Phosphatase 105 38 - 126 U/L   Total Bilirubin 3.4 (H) 0.3 - 1.2 mg/dL   GFR calc non Af Amer 20 (L) >60 mL/min   GFR calc Af Amer 23 (L) >60 mL/min    Comment:  (NOTE) The eGFR has been calculated using the CKD EPI equation. This calculation has not been validated in all clinical situations. eGFR's persistently <60 mL/min signify possible Chronic Kidney Disease.    Anion gap 8 5 - 15  CK     Status: None   Collection Time: 11/08/16  3:49 AM  Result Value Ref Range   Total CK 223 49 - 397 U/L  Protime-INR     Status: Abnormal   Collection Time: 11/09/16  4:09 AM  Result Value Ref Range   Prothrombin Time 22.2 (H) 11.4 - 15.2 seconds   INR 1.92      Lipid Panel     Component Value Date/Time   CHOL 78 10/22/2016 1631   TRIG 124 10/22/2016 1631   HDL 15 (L) 10/22/2016 1631   CHOLHDL 5.2 (H) 10/22/2016 1631   VLDL 25 10/22/2016 1631   LDLCALC 38 10/22/2016 1631     Lab Results  Component Value Date   HGBA1C 4.8 11/06/2016        HPI :  [name] [name] is a 75 year old male with a past medical history of CAD s/p CABG in 2001 (LIMA-LAD, SVG - first OM, and SVG - PDA), chronic atrial fibrillation on Coumadin, ischemic cardiomyopathy (EF 40-45%) s/p BiV ICD revised in 2013, and CKD. Admitted on 11/05/16 with weakness, INR was >10.  [name] [name] [name] tells me that the patient has had worsening weakness for the past month, bust especially the last week. He had been using a cane to help him walk throughout the house, but in the past week he has been so weak that he requires a wheelchair to move throughout the house. [name] [name] denies chest pain, does feel SOB at rest. He had been using his home oxygen only as needed, but has used it continually  in the past week.   He was last admitted from 05/05/16 to 05/12/16  with acute respiratory failure related to a streptococcal PNA. EF 40-45% by Echo with severe pulmonary HTN. TFTs were abnormal with elevated Free T4 and TSH felt to be most likely sick euthyroid. Coreg dose reduced due to hypotension. Seen in ED later in June with L4 compression fracture after falling out of his golf cart.   EKG this  admission shows V paced rhythm, his creatinine is 2.69, GFR is 21. His baseline appears to be 1.6-1.8. His INR at admission was >10, this has resolved with FFP and vitamin K, INR is now 1.8. AST is 68.   HOSPITAL COURSE:   Bright red blood per rectum, without recurrence Documentation from RN 11/27 that the patient had a bloody bowel movement Patient off anticoagulation, last INR 1.81 Continue to monitor for ongoing bleeding, doubt he will be a candidate for invasive workup if the patient continues to have GI bleeding Baseline hemoglobin 10.8>9.7, hemoglobin stable Coumadin discontinued given poor prognosis   Failure to thrive in adult -No acute etiology. He has chronic back pain, but denies any back pain on exam today. He does not have any focal deficits on exam but just complains of generalized weakness in all extremities. No specific reason found for patient's decline, recommend that patient be discharged home with hospice Family is agreeable He is not a candidate for dialysis of feeding tube Noted to have significant aspiration  Supratherapeutic INR -CT scan of the head was done showed no evidence of bleeding. This has been reversed with vitamin K and FFP.  INR subtherapeutic. Patient has been taken off Coumadin by cardiology  Hypotension Hydrated with IV fluids, Cortisol within normal limits   AKI on CKD stage 3, cardiorenal syndrome  -Baseline Cr 1.6-1.8. Presented with a creatinine of 2.21, creatinine continues to trend up -UA negative  Patient is not a candidate for hemodialysis  Chronic atrial fibrillation, followed by Dr. Peter Martinique -CHA2DS2-VAScof at least 3. Digoxin level within normal limits.  All medications including, amiodarone, and digoxin now discontinued  Chronic systolic CHF/ischemic cardiomyopathy/CABG in 2001 -LVEF is 40-45% in May 2017. Currently appears to be compensated without symptoms or signs of decompensation. Monitor.  Marland Kitchen Avoid ACE  inhibitor/ARB due to to renal insufficiency.   Hypothyroidism -TSH elevated, but T4 elevated as well. Unable to take by mouth medications, synthroid on hold.   history of ventricular tachycardia and has a biventricular ICD revised in December 2013. Continues to be unable to take by mouth medications, therefore discontinued several medications which are not needed  June with L4 compression fracture after falling out of his golf cart Now mostly in the wheelchair  Remote CVA, progressive weakness/dysphagia Discontinued Coumadin Has dysphagia ,MBS, speech therapy recommendations, continue to recommend npo CT head No evidence for acute abnormality Myasthenia gravis panel still pending, CK ESR within normal limits   Discharge Exam:   Blood pressure (!) 96/54, pulse 60, temperature 97.7 F (36.5 C), temperature source Oral, resp. rate 18, height '5\' 6"'$  (1.676 m), weight 60.3 kg (132 lb 14.4 oz), SpO2 94 %.  General:Pleasant, elderly male, very frail.  Psych: Normal affect. Neuro: Alert and oriented X 3. Moves all extremities spontaneously. HEENT:Normal Neck: Supple without bruits, + JVD. Lungs: Resp regular and unlabored, diminished in bases Heart:RRR no s3, s4, or murmurs. Abdomen: Soft, non-tender, non-distended, BS + x 4.  Extremities:No clubbing, cyanosis or edema. DP/PT/Radials 2+ and equal bilaterally.  SignedReyne Dumas 11/09/2016, 9:19 AM        Time spent >45 mins

## 2016-11-09 NOTE — Telephone Encounter (Signed)
Returned call to [place] with Hospice.Advised Dr.Jordan is out of office this week.Advised I will send message to Dr.Jordan.

## 2016-11-09 NOTE — Telephone Encounter (Signed)
I can be attending if needed.  Kahlee Metivier [name] MD, [facility]

## 2016-11-09 NOTE — Progress Notes (Signed)
Patient Name: [name] [name] Date of Encounter: 11/09/2016  Primary Cardiologist: Peter [name] MD  Hospital Problem List     Principal Problem:   Failure to thrive in adult Active Problems:   CHRONIC SYSTOLIC HEART FAILURE   Atrial fibrillation (HCC)   Status post CVA   Hypothyroidism (acquired)   Chronic kidney disease, stage 3   Weakness   AKI (acute kidney injury) (HCC)     Subjective   Patient is OK- ate something this am. No pain. Weak.  Inpatient Medications    Scheduled Meds: . enoxaparin  30 mg Subcutaneous Daily   Continuous Infusions: . sodium chloride 75 mL/hr at 11/09/16 0500   PRN Meds: [DISCONTINUED] acetaminophen **OR** acetaminophen, LORazepam, morphine injection, [DISCONTINUED] ondansetron **OR** ondansetron (ZOFRAN) IV   Vital Signs    Vitals:   11/08/16 0500 11/08/16 1715 11/08/16 2214 11/09/16 0405  BP: (!) 94/59 (!) 97/45 (!) 93/48 (!) 96/54  Pulse: 60 60 (!) 57 60  Resp: 20 18 18 18   Temp: 98.6 F (37 C)  97.9 F (36.6 C) 97.7 F (36.5 C)  TempSrc: Oral   Oral  SpO2: 96%  93% 94%  Weight:      Height:        Intake/Output Summary (Last 24 hours) at 11/09/16 0941 Last data filed at 11/09/16 0500  Gross per 24 hour  Intake             1875 ml  Output              500 ml  Net             1375 ml   Filed Weights   11/05/16 1531 11/05/16 1945  Weight: 130 lb (59 kg) 132 lb 14.4 oz (60.3 kg)    Physical Exam    General: Pleasant, elderly male, very frail.  Psych: Normal affect. Neuro: Alert and oriented X 3. Moves all extremities spontaneously. HEENT: Normal           Neck: Supple without bruits, + JVD. Lungs:  Resp regular and unlabored, diminished in bases Heart: RRR no s3, s4, or murmurs. Abdomen: Soft, non-tender, non-distended, BS + x 4.  Extremities: No clubbing, cyanosis or edema. DP/PT/Radials 2+ and equal bilaterally.   Labs    CBC  Recent Labs  11/07/16 0542 11/08/16 0349  WBC 14.8* 13.0*  HGB 9.7*  9.4*  HCT 29.8* 28.8*  MCV 92.8 92.9  PLT 120* 126*   Basic Metabolic Panel  Recent Labs  11/07/16 0542 11/08/16 0349  NA 138 141  K 3.9 4.1  CL 110 113*  CO2 20* 20*  GLUCOSE 101* 83  BUN 49* 55*  CREATININE 2.69* 2.85*  CALCIUM 8.6* 8.5*   Liver Function Tests  Recent Labs  11/07/16 1556 11/08/16 0349  AST 73* 61*  ALT 39 34  ALKPHOS 117 105  BILITOT 3.8* 3.4*  PROT 6.1* 5.9*  ALBUMIN 2.2* 2.0*   No results for input(s): LIPASE, AMYLASE in the last 72 hours. Cardiac Enzymes  Recent Labs  11/07/16 1556 11/08/16 0349  CKTOTAL 148 223   BNP Invalid input(s): POCBNP D-Dimer No results for input(s): DDIMER in the last 72 hours. Hemoglobin A1C No results for input(s): HGBA1C in the last 72 hours. Fasting Lipid Panel No results for input(s): CHOL, HDL, LDLCALC, TRIG, CHOLHDL, LDLDIRECT in the last 72 hours. Thyroid Function Tests No results for input(s): TSH, T4TOTAL, T3FREE, THYROIDAB in the last 72 hours.  Invalid input(s): FREET3  Telemetry    N/A - Personally Reviewed  ECG    V paced- Personally Reviewed  Radiology    Dg Chest 1 View  Result Date: 11/07/2016 CLINICAL DATA:  Cough. EXAM: CHEST 1 VIEW COMPARISON:  11/05/2016 FINDINGS: Stable normal heart size. Status post CABG and biventricular ICD/ pacer implant. Chronic interstitial coarsening, likely interstitial lung disease. Lung volumes are normal and no honeycombing is noted. No evidence of superimposed pneumonia or edema. No effusion or pneumothorax. IMPRESSION: Chronic lung disease without acute superimposed finding. Electronically Signed   By: Marnee Spring M.D.   On: 11/07/2016 15:53   Dg Swallowing Func-speech Pathology  Result Date: 11/07/2016 Objective Swallowing Evaluation: Type of Study: MBS-Modified Barium Swallow Study Patient Details Name: [name] [name] MRN: [id number] Date of Birth: 09/06/1941 Today's Date: 11/07/2016 Time: SLP Start Time (ACUTE ONLY): 1520-SLP Stop Time  (ACUTE ONLY): 1550 SLP Time Calculation (min) (ACUTE ONLY): 30 min Past Medical History: Past Medical History: Diagnosis Date . Anxiety  . Atrial fibrillation (HCC)   chronic . Cardiomyopathy, ischemic   EF of 30-35% . CHF (congestive heart failure) (HCC)  . CKD (chronic kidney disease) stage 3, GFR 30-59 ml/min  . Coronary artery disease  . Dyslipidemia  . Hypertension  . Myocardial infarction January 2001   History of anteroseptal myocardial infarction presenting with left bundle-branch block . Pacemaker  . Peripheral vascular disease (HCC)  . Status post CVA  . Thyroid disease   hypo,due to amiodarone therapy,resolved . Tobacco abuse   History of 2 pack per day tobacco habituation . VT (ventricular tachycardia) (HCC) aug of 2010  removed old ICD and replaced with biventricular ICD Past Surgical History: Past Surgical History: Procedure Laterality Date . CARDIAC CATHETERIZATION  2001  PTCA . CARDIAC CATHETERIZATION  2005  patent grafts . cataracts  2006  right eye . CHOLECYSTECTOMY   . CORONARY ARTERY BYPASS GRAFT  2001  x 4 . FEMORAL BYPASS    fem-fem bpg, right fem pop bpg . icd  07/15/2009  implant Dr. Ladona Ridgel . IMPLANTABLE CARDIOVERTER DEFIBRILLATOR (ICD) GENERATOR CHANGE N/A 11/28/2012  Procedure: ICD GENERATOR CHANGE;  Surgeon: Marinus Maw, MD;  Location: [facility] CATH LAB;  Service: Cardiovascular;  Laterality: N/A; HPI: [name] [name] a 75 y.o.malewith medical history significant of atrial fibrillation, on Coumadin for anticoagulation, chronic CHF with LVEF of 30-35%. Patient has history of CVA with resultant mild right-sided hemiparesis. Patient brought to the hospital by his wife and his granddaughter as he is been very weak for the past several days. He is eating less, getting up less, and he reports that he is weak to the point he couldn't get out of the bed today so they brought him for further evaluation. Denies any fever or chills. His INR also was found to be >10. He was given vitamin K as well as  FFP. OT notes indicate pt provided with small sip of water via straw in upright position at bed level; pt noted to have wet cough following sip of water. Pt and wife report this is new since hospitalization. Renal imaging revealed severe cortical atrophy bilaterally. No hydronephrosis or mass. MRI didn't reveal any acute abnormality. Remote infarcts within the basal ganglia and left insular cortex and atrophy/small vessel disease. 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 Pt hospitalized on 05/09/2016 acute on chronic hypoxic respiratory failure secondary to community-acquired pneumonia and decompensated CHF. Bedside  Swallow Evaluation provided on 05/09/2016 and regular with thin liquid diet recommended.  Subjective: alert, pleasant Assessment / Plan / Recommendation CHL IP CLINICAL IMPRESSIONS 11/07/2016 Therapy Diagnosis Severe pharyngeal phase dysphagia Clinical Impression Pt has severe sensorimotor dysphagia c/b delayed swallow initiation across all consistencies and pharyngeal weakness. This results in aspiration of thin liquids as well as penetration of significant pharyngeal residue from nectar thick liquids and puree consistencies, none of which were spontaneously cleared. Pt with difficulty consistently implementing compensatory strategies. At times, pt was able to increase airway protection with cough and double swallow.  Given significant dysphagia and decreased ability to reliably use compensatory swallow strategies, pt is at high risk for aspiration with all PO intake. Education provided to MD on results of study and aspiration risk. Would recommend to remain NPO pending further GOC discussion. Should pt/family choose to accept the risks of aspiration, a full nectar thick liquid diet with use of aforementioned strategies may be considered. Impact on safety and function Severe aspiration risk;Risk  for inadequate nutrition/hydration   CHL IP TREATMENT RECOMMENDATION 11/07/2016 Treatment Recommendations Therapy as outlined in treatment plan below   Prognosis 11/07/2016 Prognosis for Safe Diet Advancement Guarded Barriers to Reach Goals Cognitive deficits;Severity of deficits Barriers/Prognosis Comment -- CHL IP DIET RECOMMENDATION 11/07/2016 SLP Diet Recommendations NPO Liquid Administration via -- Medication Administration Via alternative means Compensations -- Postural Changes --   CHL IP OTHER RECOMMENDATIONS 11/07/2016 Recommended Consults -- Oral Care Recommendations Oral care QID Other Recommendations Remove water pitcher   CHL IP FOLLOW UP RECOMMENDATIONS 11/07/2016 Follow up Recommendations [facility]QUENC[facility] AND DURATION 11/07/2016 Speech Therapy Frequency (ACUTE ONLY) min 2x/week Treatment Duration 2 weeks      CHL IP ORAL PHASE 11/07/2016 Oral Phase WFL Oral - Pudding Teaspoon -- Oral - Pudding Cup -- Oral - Honey Teaspoon -- Oral - Honey Cup -- Oral - Nectar Teaspoon -- Oral - Nectar Cup -- Oral - Nectar Straw -- Oral - Thin Teaspoon -- Oral - Thin Cup -- Oral - Thin Straw -- Oral - Puree -- Oral - Mech Soft -- Oral - Regular -- Oral - Multi-Consistency -- Oral - Pill -- Oral Phase - Comment --  CHL IP PHARYNGEAL PHASE 11/07/2016 Pharyngeal Phase Impaired Pharyngeal- Pudding Teaspoon -- Pharyngeal -- Pharyngeal- Pudding Cup -- Pharyngeal -- Pharyngeal- Honey Teaspoon -- Pharyngeal -- Pharyngeal- Honey Cup -- Pharyngeal -- Pharyngeal- Nectar Teaspoon Delayed swallow initiation-vallecula;Penetration/Apiration after swallow;Pharyngeal residue - valleculae;Pharyngeal residue - posterior pharnyx;Pharyngeal residue - pyriform;Pharyngeal residue - cp segment;Trace aspiration;Reduced pharyngeal peristalsis;Reduced tongue base retraction;Compensatory strategies attempted (with notebox) Pharyngeal Material enters airway, remains ABOVE vocal cords and not ejected out Pharyngeal- Nectar  Cup Delayed swallow initiation-vallecula;Delayed swallow initiation-pyriform sinuses;Penetration/Apiration after swallow;Trace aspiration;Pharyngeal residue - valleculae;Pharyngeal residue - pyriform;Pharyngeal residue - posterior pharnyx;Pharyngeal residue - cp segment;Reduced pharyngeal peristalsis;Reduced tongue base retraction;Compensatory strategies attempted (with notebox) Pharyngeal Material enters airway, remains ABOVE vocal cords and not ejected out Pharyngeal- Nectar Straw -- Pharyngeal -- Pharyngeal- Thin Teaspoon Delayed swallow initiation-pyriform sinuses;Reduced airway/laryngeal closure;Significant aspiration (Amount);Penetration/Aspiration before swallow Pharyngeal Material enters airway, passes BELOW cords without attempt by patient to eject out (silent aspiration) Pharyngeal- Thin Cup -- Pharyngeal -- Pharyngeal- Thin Straw -- Pharyngeal -- Pharyngeal- Puree Delayed swallow initiation-vallecula;Pharyngeal residue - valleculae;Pharyngeal residue - pyriform;Pharyngeal residue - posterior pharnyx;Pharyngeal residue - cp segment;Reduced tongue base retraction;Reduced pharyngeal peristalsis Pharyngeal -- Pharyngeal- Mechanical Soft -- Pharyngeal -- Pharyngeal- Regular -- Pharyngeal -- Pharyngeal- Multi-consistency -- Pharyngeal -- Pharyngeal- Pill -- Pharyngeal -- Pharyngeal  Comment --  CHL IP CERVICAL ESOPHAGEAL PHASE 11/07/2016 Cervical Esophageal Phase WFL Pudding Teaspoon -- Pudding Cup -- Honey Teaspoon -- Honey Cup -- Nectar Teaspoon -- Nectar Cup -- Nectar Straw -- Thin Teaspoon -- Thin Cup -- Thin Straw -- Puree -- Mechanical Soft -- Regular -- Multi-consistency -- Pill -- Cervical Esophageal Comment -- CHL IP GO 11/07/2016 Functional Assessment Tool Used Skilled Clinical observation Functional Limitations Swallowing Swallow Current Status (W1191) CM Swallow Goal Status (Y7829) CK Swallow Discharge Status (F6213) CI Motor Speech Current Status (Y8657) (None) Motor Speech Goal Status (Q4696)  (None) Motor Speech Goal Status (E9528) (None) Spoken Language Comprehension Current Status (U1324) (None) Spoken Language Comprehension Goal Status (M0102) (None) Spoken Language Comprehension Discharge Status (V2536) (None) Spoken Language Expression Current Status (U4403) (None) Spoken Language Expression Goal Status (K7425) (None) Spoken Language Expression Discharge Status [phone]) (None) Attention Current Status (V5643) (None) Attention Goal Status (P2951) (None) Attention Discharge Status (O8416) (None) Memory Current Status (S0630) (None) Memory Goal Status (Z6010) (None) Memory Discharge Status (X3235) (None) Voice Current Status (T7322) (None) Voice Goal Status (G2542) (None) Voice Discharge Status (H0623) (None) Other Speech-Language Pathology Functional Limitation [phone]) (None) Other Speech-Language Pathology Functional Limitation Goal Status (T5176) (None) Other Speech-Language Pathology Functional Limitation Discharge Status [id number]) (None) Happi Overton 11/07/2016, 4:37 PM               Cardiac Studies   none  Patient Profile     Mr. Araki is a 75 year old male with a past medical history of CAD s/p CABG in 2001 (LIMA-LAD, SVG - first OM, and SVG - PDA), chronic atrial fibrillation on Coumadin, ischemic cardiomyopathy (EF 40-45%) s/p BiV ICD revised in 2013, and CKD. Admitted on 11/05/16 with weakness, INR was >10.   Assessment & Plan    1. Chronic atrial fibrillation: On Coumadin, INR has normalized.  Now plan for DC with hospice care. I would not resume coumadin. Will continue low dose Coreg for rate control. Stop digoxin.  This patients CHA2DS2-VASc Score and unadjusted Ischemic Stroke Rate (% per year) is equal to 9.7 % stroke rate/year from a score of 6 Above score calculated as 1 point each if present [CHF, HTN, DM, Vascular=MI/PAD/Aortic Plaque, Age if 65-74, or Male], 2 points each if present [Age > 75, or Stroke/TIA/TE]  2. History of VT: s/p BiV ICD, Amiodarone  discontinued. Will see about turning ICD therapies off.   3. Failure to thrive: Unclear etiology. No clear reason. No reversible cause seen . Is suspect this is involutional. Prognosis is very poor.   4. Chronic systolic CHF: Appears compensated. Will continue lasix at lower dose.  5. Acute on chronic kidney disease: No ACE-I or ARB. Renal function is worse despite holding meds and hydration  6. Severe swallowing dysfunction with high aspiration risk. Now NPO.  Plan DC today on Hospice   Signed, Peter [name], MD  11/09/2016, 9:41 AM

## 2016-11-09 NOTE — Progress Notes (Signed)
[name] [name] to be D/C'd Home per MD order.  Discussed with the patient and all questions fully answered.  VSS, Skin clean, dry and intact without evidence of skin break down, no evidence of skin tears noted. IV catheter discontinued intact. Site without signs and symptoms of complications. Dressing and pressure applied.  An After Visit Summary was printed and given to the patient. Patient received prescription.  D/c education completed with patient/family including follow up instructions, medication list, d/c activities limitations if indicated, with other d/c instructions as indicated by MD - patient able to verbalize understanding, all questions fully answered.   Patient instructed to return to ED, call 911, or call MD for any changes in condition.   Patient escorted via WC, with oxygen for transport and D/C home via private auto.  [name] [name] 11/09/2016 2:07 PM

## 2016-11-09 NOTE — Telephone Encounter (Signed)
Yvette from Hospice calling back,she said she never heard back from her call yesterday.

## 2016-11-12 NOTE — Telephone Encounter (Signed)
Spoke with Bronson Ing at hospice she states that PCP will be attending

## 2016-11-14 LAB — ACETYLCHOLINE RECEPTOR AB, ALL
ACETYLCHOL BLOCK AB: 16 % (ref 0–25)
Acety choline binding ab: <0.03 nmol/L (ref 0.00–0.24)

## 2016-11-26 ENCOUNTER — Other Ambulatory Visit: Payer: Self-pay | Admitting: Cardiology

## 2016-12-10 DEATH — deceased
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
